# Patient Record
Sex: Female | Born: 1965 | Race: Black or African American | Hispanic: No | Smoking: Current every day smoker
Health system: Southern US, Community
[De-identification: ages and names within clinical notes are randomized; demographics above are authoritative.]

## PROBLEM LIST (undated history)

## (undated) DIAGNOSIS — K219 Gastro-esophageal reflux disease without esophagitis: Secondary | ICD-10-CM

## (undated) DIAGNOSIS — M1990 Unspecified osteoarthritis, unspecified site: Secondary | ICD-10-CM

## (undated) DIAGNOSIS — I10 Essential (primary) hypertension: Secondary | ICD-10-CM

## (undated) DIAGNOSIS — K625 Hemorrhage of anus and rectum: Secondary | ICD-10-CM

## (undated) DIAGNOSIS — Z9889 Other specified postprocedural states: Secondary | ICD-10-CM

## (undated) DIAGNOSIS — T8859XA Other complications of anesthesia, initial encounter: Secondary | ICD-10-CM

## (undated) DIAGNOSIS — IMO0001 Reserved for inherently not codable concepts without codable children: Secondary | ICD-10-CM

## (undated) DIAGNOSIS — E119 Type 2 diabetes mellitus without complications: Secondary | ICD-10-CM

## (undated) DIAGNOSIS — D250 Submucous leiomyoma of uterus: Secondary | ICD-10-CM

## (undated) DIAGNOSIS — F419 Anxiety disorder, unspecified: Secondary | ICD-10-CM

## (undated) DIAGNOSIS — R9389 Abnormal findings on diagnostic imaging of other specified body structures: Secondary | ICD-10-CM

## (undated) DIAGNOSIS — T4145XA Adverse effect of unspecified anesthetic, initial encounter: Secondary | ICD-10-CM

## (undated) HISTORY — PX: COLONOSCOPY: SHX174

## (undated) HISTORY — DX: Abnormal findings on diagnostic imaging of other specified body structures: R93.89

## (undated) HISTORY — PX: BREAST LUMPECTOMY: SHX2

## (undated) HISTORY — DX: Type 2 diabetes mellitus without complications: E11.9

## (undated) HISTORY — PX: TUBAL LIGATION: SHX77

## (undated) HISTORY — PX: DENTAL SURGERY: SHX609

## (undated) HISTORY — DX: Submucous leiomyoma of uterus: D25.0

---

## 2003-09-19 ENCOUNTER — Emergency Department (HOSPITAL_COMMUNITY): Admission: EM | Admit: 2003-09-19 | Discharge: 2003-09-19 | Payer: Self-pay | Admitting: Emergency Medicine

## 2004-02-11 ENCOUNTER — Ambulatory Visit (HOSPITAL_COMMUNITY): Admission: RE | Admit: 2004-02-11 | Discharge: 2004-02-11 | Payer: Self-pay | Admitting: Family Medicine

## 2005-02-10 ENCOUNTER — Ambulatory Visit: Payer: Self-pay | Admitting: Family Medicine

## 2005-03-10 ENCOUNTER — Ambulatory Visit: Payer: Self-pay | Admitting: Family Medicine

## 2005-03-16 ENCOUNTER — Ambulatory Visit (HOSPITAL_COMMUNITY): Admission: RE | Admit: 2005-03-16 | Discharge: 2005-03-16 | Payer: Self-pay | Admitting: Family Medicine

## 2005-03-18 ENCOUNTER — Ambulatory Visit: Payer: Self-pay | Admitting: Family Medicine

## 2005-03-19 ENCOUNTER — Ambulatory Visit (HOSPITAL_COMMUNITY): Admission: RE | Admit: 2005-03-19 | Discharge: 2005-03-19 | Payer: Self-pay | Admitting: Family Medicine

## 2005-07-15 ENCOUNTER — Ambulatory Visit: Payer: Self-pay | Admitting: Internal Medicine

## 2005-08-17 ENCOUNTER — Emergency Department (HOSPITAL_COMMUNITY): Admission: EM | Admit: 2005-08-17 | Discharge: 2005-08-17 | Payer: Self-pay | Admitting: Emergency Medicine

## 2005-11-13 ENCOUNTER — Ambulatory Visit: Payer: Self-pay | Admitting: Cardiology

## 2006-05-25 ENCOUNTER — Emergency Department (HOSPITAL_COMMUNITY): Admission: EM | Admit: 2006-05-25 | Discharge: 2006-05-25 | Payer: Self-pay | Admitting: Emergency Medicine

## 2006-05-28 ENCOUNTER — Emergency Department (HOSPITAL_COMMUNITY): Admission: EM | Admit: 2006-05-28 | Discharge: 2006-05-28 | Payer: Self-pay | Admitting: Emergency Medicine

## 2006-05-30 ENCOUNTER — Emergency Department (HOSPITAL_COMMUNITY): Admission: EM | Admit: 2006-05-30 | Discharge: 2006-05-30 | Payer: Self-pay | Admitting: Emergency Medicine

## 2006-10-19 ENCOUNTER — Emergency Department (HOSPITAL_COMMUNITY): Admission: EM | Admit: 2006-10-19 | Discharge: 2006-10-20 | Payer: Self-pay | Admitting: Emergency Medicine

## 2007-03-16 IMAGING — US US EXTREM LOW VENOUS BILAT
1 series · 13 of 24 positions shown · non-contrast
Comparison: none

HISTORY: Bilateral arm swelling

[Series 1: unknown · 0.09mm/px · 13 of 67 slices shown]
[im 1/67]
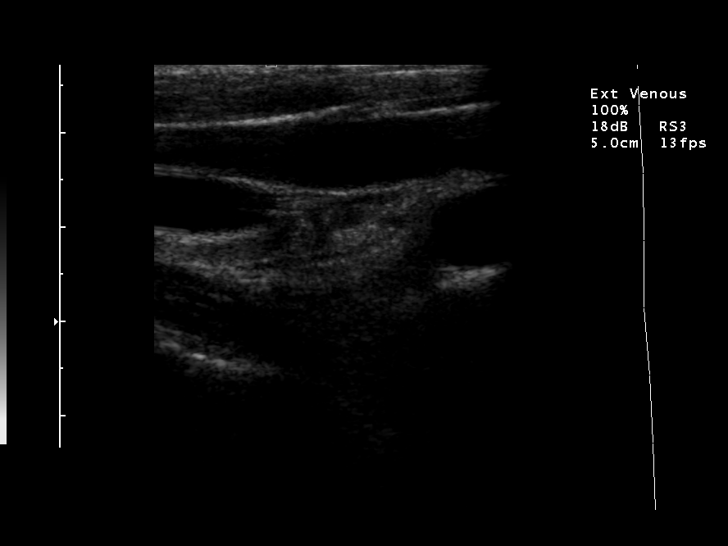
[im 6/67]
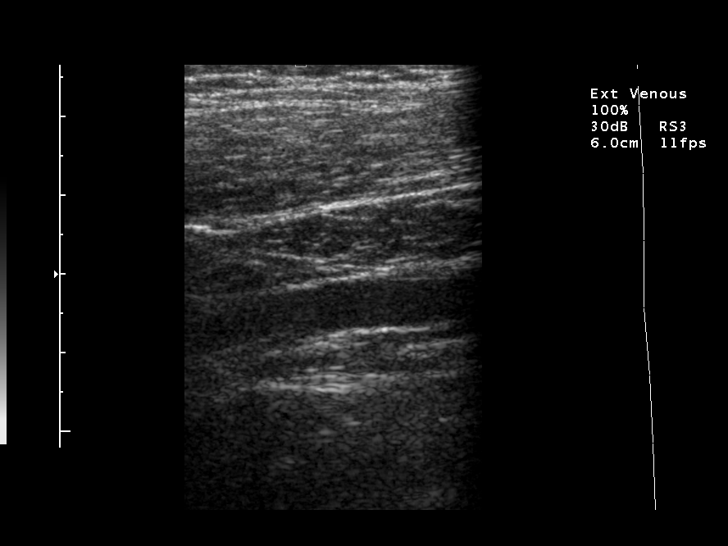
[im 12/67]
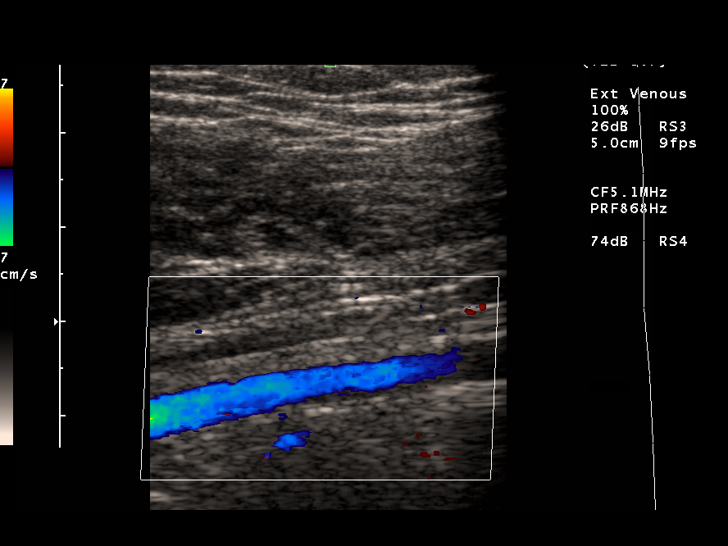
[im 18/67]
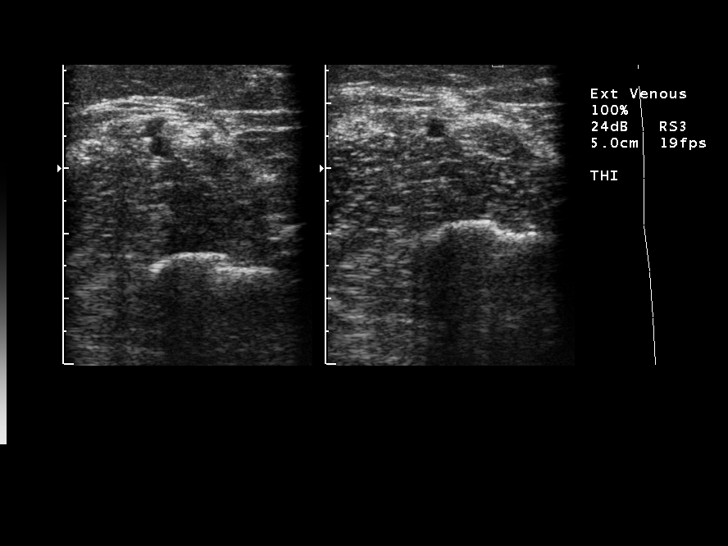
[im 23/67]
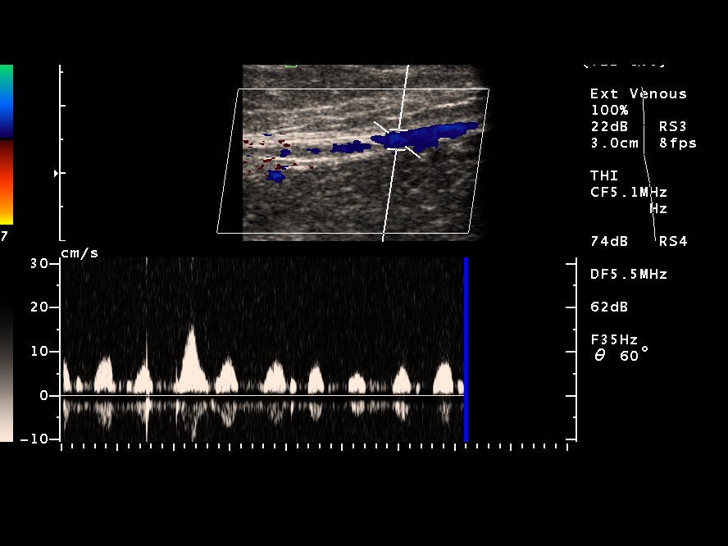
[im 29/67]
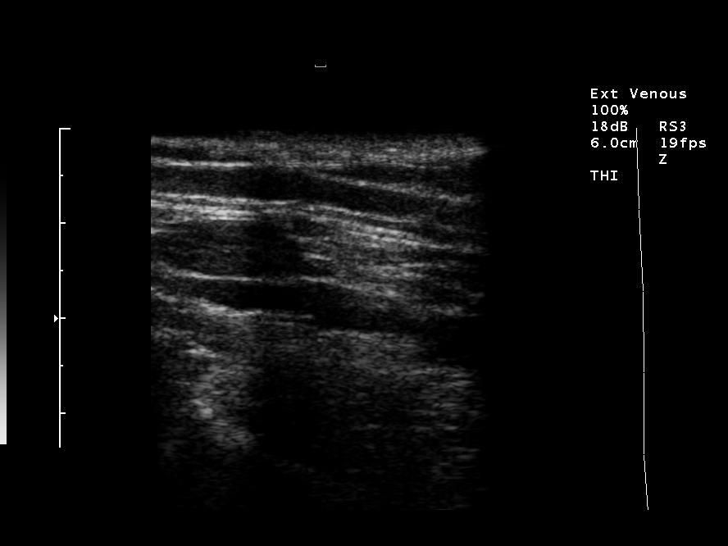
[im 35/67]
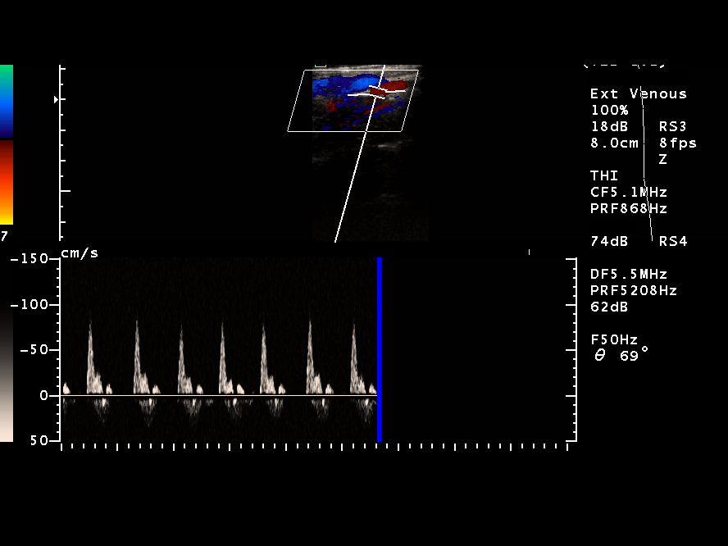
[im 38/67]
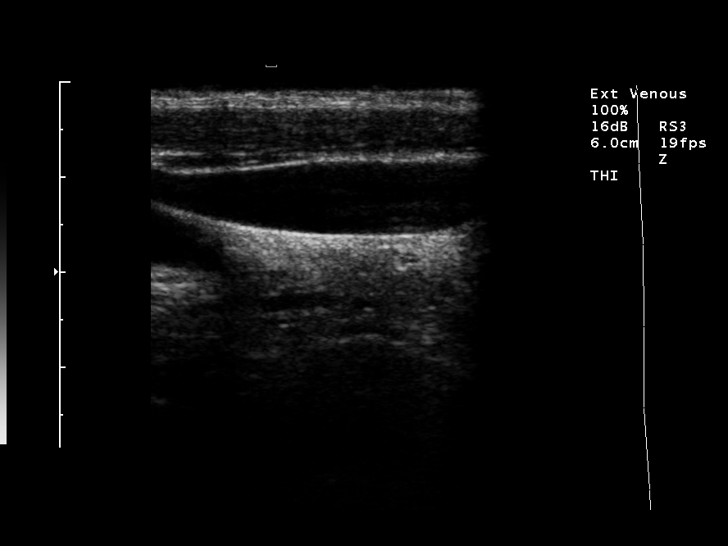
[im 44/67]
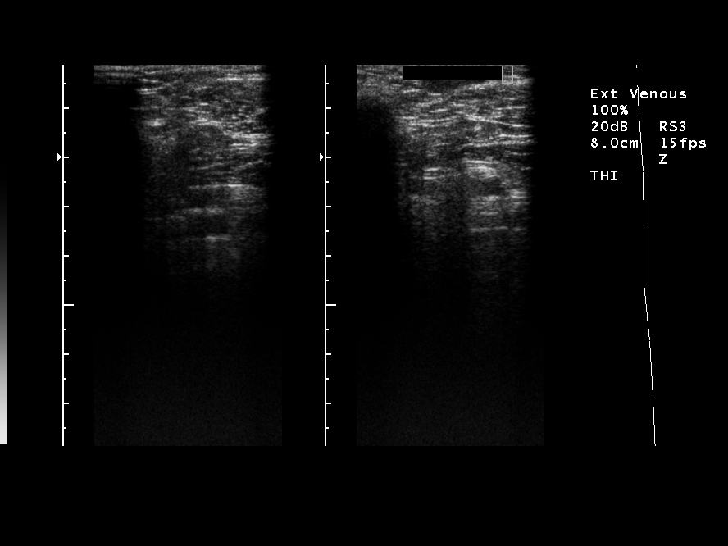
[im 49/67]
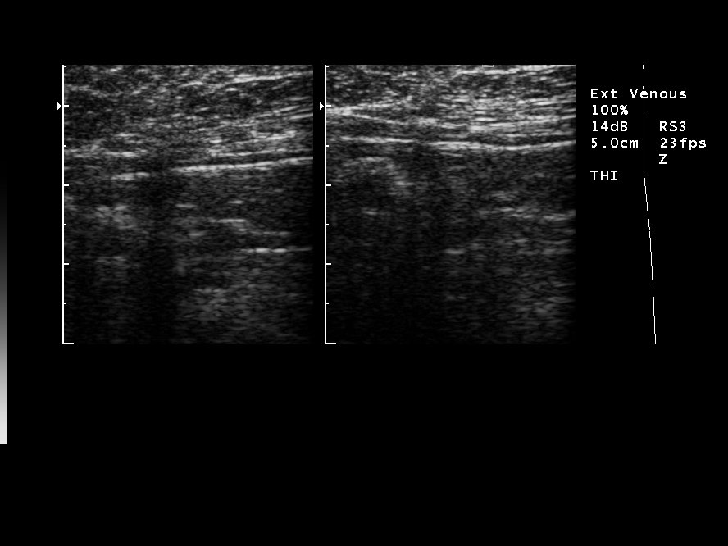
[im 55/67]
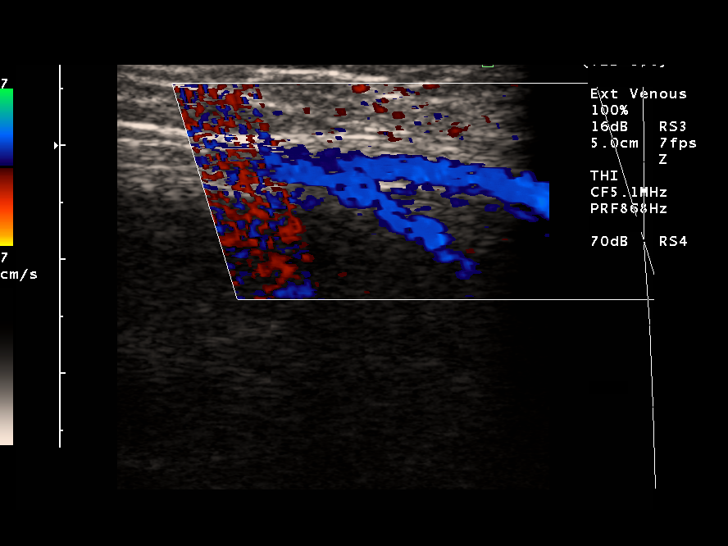
[im 61/67]
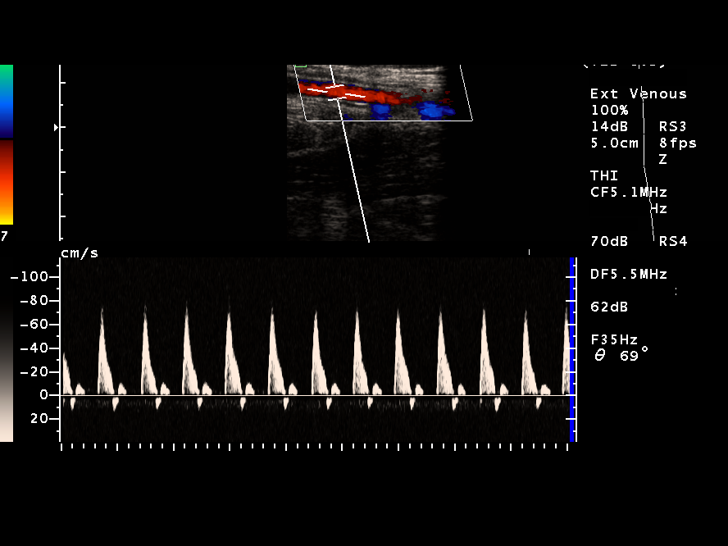
[im 67/67]
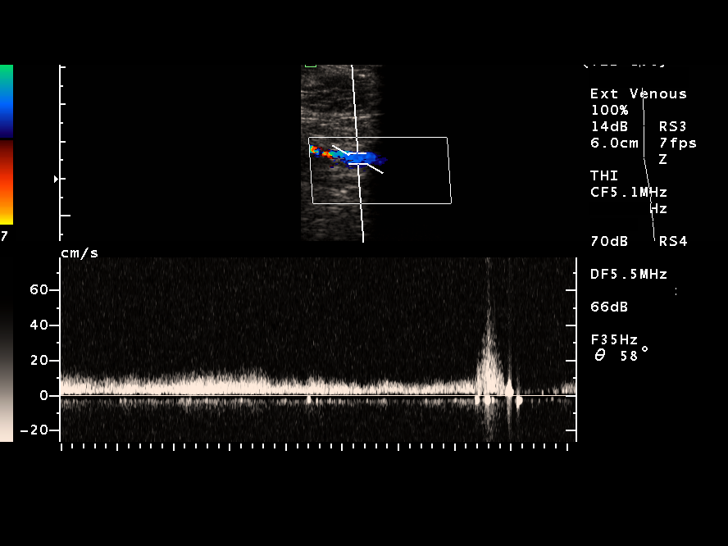

[13 of 24 positions shown; findings below may reference images not displayed]

ULTRASOUND VENOUS DUPLEX IMAGING BILATERAL UPPER EXTREMITIES:

Grayscale, color Doppler, and graded compression sonography of the upper
extremity deep venous systems performed bilaterally.
In both upper extremities, spontaneous venous flow is identified.
Intact augmentation.
No intraluminal thrombus identified.
Incidentally, the arterial systems of both arms were imaged.
Right upper extremity demonstrates normal triphasic waveforms without increase
in systolic velocity to suggest stenosis.
In the left upper extremity, triphasic arterial wave forms are seen without
increased peak systolic velocity to suggest stenosis.
Velocity in the left common carotid artery is minimally increased at
approximately 150 cm/sec, of uncertain significance as a complete carotid exam
was not performed.
Incidentally noted is phasic blood flow in the subclavian veins bilaterally
suggesting elevated right heart pressure.
IMPRESSION: No evidence of the venous thrombosis in the upper extremities.
Question elevated right heart pressures.
Normal triphasic arterial wave forms in upper extremities bilaterally without
waveform changes or increase in peak systolic velocities to suggest significant
arterial stenosis.
Potentially slightly increased peak systolic velocity of flow within the left
common carotid artery, of uncertain significance.

## 2008-03-06 ENCOUNTER — Emergency Department (HOSPITAL_COMMUNITY): Admission: EM | Admit: 2008-03-06 | Discharge: 2008-03-06 | Payer: Self-pay | Admitting: Emergency Medicine

## 2008-03-07 DIAGNOSIS — R143 Flatulence: Secondary | ICD-10-CM

## 2008-03-07 DIAGNOSIS — Z8719 Personal history of other diseases of the digestive system: Secondary | ICD-10-CM

## 2008-03-07 DIAGNOSIS — R1032 Left lower quadrant pain: Secondary | ICD-10-CM

## 2008-03-07 DIAGNOSIS — R141 Gas pain: Secondary | ICD-10-CM

## 2008-03-07 DIAGNOSIS — R142 Eructation: Secondary | ICD-10-CM

## 2008-03-07 DIAGNOSIS — F17200 Nicotine dependence, unspecified, uncomplicated: Secondary | ICD-10-CM | POA: Insufficient documentation

## 2008-03-07 DIAGNOSIS — R1012 Left upper quadrant pain: Secondary | ICD-10-CM

## 2008-03-07 DIAGNOSIS — D649 Anemia, unspecified: Secondary | ICD-10-CM

## 2008-06-17 ENCOUNTER — Emergency Department (HOSPITAL_COMMUNITY): Admission: EM | Admit: 2008-06-17 | Discharge: 2008-06-17 | Payer: Self-pay | Admitting: Emergency Medicine

## 2008-08-22 ENCOUNTER — Encounter: Payer: Self-pay | Admitting: Orthopedic Surgery

## 2008-08-22 ENCOUNTER — Ambulatory Visit (HOSPITAL_COMMUNITY): Admission: RE | Admit: 2008-08-22 | Discharge: 2008-08-22 | Payer: Self-pay | Admitting: Family Medicine

## 2008-09-05 ENCOUNTER — Ambulatory Visit (HOSPITAL_COMMUNITY): Admission: RE | Admit: 2008-09-05 | Discharge: 2008-09-05 | Payer: Self-pay | Admitting: Family Medicine

## 2008-09-19 ENCOUNTER — Emergency Department (HOSPITAL_COMMUNITY): Admission: EM | Admit: 2008-09-19 | Discharge: 2008-09-19 | Payer: Self-pay | Admitting: Emergency Medicine

## 2009-01-14 ENCOUNTER — Emergency Department (HOSPITAL_COMMUNITY): Admission: EM | Admit: 2009-01-14 | Discharge: 2009-01-14 | Payer: Self-pay | Admitting: Emergency Medicine

## 2009-08-26 ENCOUNTER — Encounter: Payer: Self-pay | Admitting: Orthopedic Surgery

## 2009-08-26 ENCOUNTER — Emergency Department (HOSPITAL_COMMUNITY): Admission: EM | Admit: 2009-08-26 | Discharge: 2009-08-27 | Payer: Self-pay | Admitting: Emergency Medicine

## 2009-09-09 ENCOUNTER — Ambulatory Visit (HOSPITAL_COMMUNITY): Admission: RE | Admit: 2009-09-09 | Discharge: 2009-09-09 | Payer: Self-pay | Admitting: Family Medicine

## 2009-09-16 ENCOUNTER — Telehealth: Payer: Self-pay | Admitting: Orthopedic Surgery

## 2009-09-16 ENCOUNTER — Ambulatory Visit: Payer: Self-pay | Admitting: Orthopedic Surgery

## 2009-09-16 DIAGNOSIS — IMO0002 Reserved for concepts with insufficient information to code with codable children: Secondary | ICD-10-CM | POA: Insufficient documentation

## 2009-09-16 DIAGNOSIS — S93409A Sprain of unspecified ligament of unspecified ankle, initial encounter: Secondary | ICD-10-CM | POA: Insufficient documentation

## 2009-09-17 ENCOUNTER — Telehealth: Payer: Self-pay | Admitting: Orthopedic Surgery

## 2009-09-23 ENCOUNTER — Encounter (HOSPITAL_COMMUNITY): Admission: RE | Admit: 2009-09-23 | Discharge: 2009-10-23 | Payer: Self-pay | Admitting: Orthopedic Surgery

## 2009-09-24 ENCOUNTER — Telehealth: Payer: Self-pay | Admitting: Orthopedic Surgery

## 2009-10-04 ENCOUNTER — Encounter: Payer: Self-pay | Admitting: Orthopedic Surgery

## 2009-11-11 ENCOUNTER — Encounter: Payer: Self-pay | Admitting: Orthopedic Surgery

## 2009-11-13 ENCOUNTER — Encounter: Payer: Self-pay | Admitting: Orthopedic Surgery

## 2009-12-03 ENCOUNTER — Emergency Department (HOSPITAL_COMMUNITY): Admission: EM | Admit: 2009-12-03 | Discharge: 2009-12-04 | Payer: Self-pay | Admitting: Emergency Medicine

## 2009-12-06 ENCOUNTER — Emergency Department (HOSPITAL_COMMUNITY): Admission: EM | Admit: 2009-12-06 | Discharge: 2009-12-06 | Payer: Self-pay | Admitting: Emergency Medicine

## 2009-12-29 ENCOUNTER — Emergency Department (HOSPITAL_COMMUNITY)
Admission: EM | Admit: 2009-12-29 | Discharge: 2009-12-29 | Payer: Self-pay | Source: Home / Self Care | Admitting: Emergency Medicine

## 2010-01-26 ENCOUNTER — Emergency Department (HOSPITAL_COMMUNITY)
Admission: EM | Admit: 2010-01-26 | Discharge: 2010-01-27 | Payer: Self-pay | Source: Home / Self Care | Admitting: Emergency Medicine

## 2010-02-16 ENCOUNTER — Encounter: Payer: Self-pay | Admitting: Family Medicine

## 2010-02-17 ENCOUNTER — Encounter: Payer: Self-pay | Admitting: Family Medicine

## 2010-02-26 NOTE — Miscellaneous (Signed)
Summary: Discharge from PT  Discharge from PT   Imported By: Jacklynn Ganong 11/13/2009 14:34:23  _____________________________________________________________________  External Attachment:    Type:   Image     Comment:   External Document

## 2010-02-26 NOTE — Miscellaneous (Signed)
Summary: PT clinical evaluation  PT clinical evaluation   Imported By: Jacklynn Ganong 10/08/2009 13:17:19  _____________________________________________________________________  External Attachment:    Type:   Image     Comment:   External Document

## 2010-02-26 NOTE — Progress Notes (Signed)
Summary: wants muscle relaxer  Phone Note Call from Patient   Summary of Call: Autumn [name] (09-Nov-2065) says her knee is very tight, asking for a muscle relaxer to be called to [facility] in [place]. She started  PT 09/23/09  Her # [phone] Initial call taken by: Jacklynn Ganong,  September 24, 2009 3:15 PM  Follow-up for Phone Call        that will not help declined   increase the # of time s she is iceing the knee   6 x a day  Follow-up by: Fuller Canada MD,  September 25, 2009 8:46 AM  Additional Follow-up for Phone Call Additional follow up Details #1::        Left a message for the patient to call our office Additional Follow-up by: Jacklynn Ganong,  September 25, 2009 11:26 AM    Additional Follow-up for Phone Call Additional follow up Details #2::    The patient has not returned the call

## 2010-02-26 NOTE — Progress Notes (Signed)
Summary: Ibuprofen not helping  Phone Note Call from Patient Call back at Home Phone [phone]   Summary of Call: says she is allergic to a white pill with red dots, I advised that I do not know pills from the way that they look, said the Ibuprofen 800 is not helping her pain much, she cannot take Hydrocodone, any suggestions. Initial call taken by: Ether Griffins,  September 17, 2009 4:16 PM  Follow-up for Phone Call        nope  Follow-up by: Fuller Canada MD,  September 17, 2009 4:24 PM  Additional Follow-up for Phone Call Additional follow up Details #1::        ok advised pt Additional Follow-up by: Ether Griffins,  September 17, 2009 4:28 PM

## 2010-02-26 NOTE — Progress Notes (Signed)
Summary: call  to patient to schedule appt  Phone Note Outgoing Call   Call placed to: Patient Summary of Call: called patient to schedule her 7-wk fol/up appt due to computer issue while she was at check-out; also advise about phys therapy / order faxed to [facility]. Initial call taken by: Cammie Sickle,  September 16, 2009 5:22 PM  Follow-up for Phone Call        Phone call completed Follow-up by: Cammie Sickle,  September 17, 2009 4:14 PM

## 2010-02-26 NOTE — Assessment & Plan Note (Signed)
Summary: [facility] 8/1/11SP RT KNEE XR KNEE/MCR/MCD/BSF   Vital Signs:  Patient profile:   45 year old female Height:      65 inches Weight:      221 pounds Pulse rate:   70 / minute Resp:     16 per minute  Vitals Entered By: Fuller Canada MD (September 16, 2009 3:47 PM)  Visit Type:  new patient Referring Provider:  [facility] Primary Provider:  Dr. Janna Arch  CC:  right knee pain.  History of Present Illness: I saw Autumn [name] in the office today for an initial visit.  She is a 45 years old woman with the complaint of:  right knee pain.  Fell down hill 08/26/09.  Meds: Tramadol 50mg  and Ibuprofen 800mg .  FYI also complains of right ankle pain, Xrays of ankle and knee 08/27/09 [facility] for review.  Xrays L spine 08/22/08 for review also.  She indicates that she is ALLERGIC to hydrocodone Vicodin codeine.  Complains of sharp RIGHT knee pain which is 7/10 came on suddenly secondary to the fall also has some ankle pain laterally.  She has some knee swelling ankle swelling.  Denies catching locking tingling numbness or giving way.      Allergies (verified): No Known Drug Allergies  Past History:  Past Medical History: heart flutters  Family History: na  Social History: Patient is single.  smokes 5 cigs per day alcohol occasional caffeine use all the time 9th grade ed.  Review of Systems Constitutional:  Complains of weight gain and chills; denies weight loss, fever, and fatigue. Cardiovascular:  Denies chest pain, palpitations, fainting, and murmurs. Respiratory:  Denies short of breath, wheezing, couch, tightness, pain on inspiration, and snoring . Gastrointestinal:  Complains of blood in your stools; denies heartburn, nausea, vomiting, diarrhea, and constipation. Genitourinary:  Denies frequency, urgency, difficulty urinating, painful urination, flank pain, and bleeding in urine. Neurologic:  Denies numbness, tingling, unsteady gait, dizziness, tremors, and  seizure. Musculoskeletal:  Complains of joint pain, swelling, and muscle pain; denies instability, stiffness, redness, and heat. Endocrine:  Denies excessive thirst, exessive urination, and heat or cold intolerance. Psychiatric:  Denies nervousness, depression, anxiety, and hallucinations. Skin:  Denies changes in the skin, poor healing, rash, itching, and redness. HEENT:  Denies blurred or double vision, eye pain, redness, and watering. Immunology:  Denies seasonal allergies, sinus problems, and allergic to bee stings. Hemoatologic:  Denies easy bleeding and brusing.  Physical Exam  Additional Exam:  This is a large female somewhat.  Vital signs are stable as recorded.  No gross deformities.  Has 2+ pulses no peripheral edema  Skin is intact RIGHT lower extremity.  Sensation is normal in both lower extremities  She is awake alert and oriented x3 mood is pleasant  She's ambulating with a brace on and crutches and poorly  RIGHT knee first seems to be overly tender over the patella and with any patella range of motion although I do not see a joint effusion.  Range of motion 70 today.  Strength could not officially do a good manual muscle testing exam but she did have intact quadriceps and extensor mechanism function with straight leg raise.  Collaterals were stable at zero and 30.  ACL and PCL were normal.  RIGHT ankle lateral tenderness and swelling tight Achilles dorsiflexion limited to neutral position.  Weak evertors week dorsiflexors.  Otherwise ankle stable.   Impression & Recommendations:  Problem # 1:  KNEE SPRAIN (ICD-844.9) Assessment New  Orders: Physical  Therapy Referral (PT) New Patient Level III (16109)  Problem # 2:  ANKLE SPRAIN, RIGHT (ICD-845.00) Assessment: New  Orders: Physical Therapy Referral (PT) New Patient Level III (60454)   X-rays x-rays show no fracture in the ankle or the knee.  These x-rays were done at the hospital.  I suppose she has  sprained her knee she definitely did sprain her ankle.  No reason for surgery.  She is placed in a hinge knee brace which can be advanced as tolerated with therapy gets going.  She should continue ASO brace and have therapy for that.  I'll see her in 7 weeks.  The Norco listed in the chart was not given due to her ALLERGY she'll stay on tramadol  Medications Added to Medication List This Visit: 1)  Norco 5-325 Mg Tabs (Hydrocodone-acetaminophen) .Marland Kitchen.. 1 q 4 as needed pain  Patient Instructions: 1)  Wear brace when walking  2)  take off for sleeping and bathing  3)  take ibuprofen for pain  4)  go for PT 5)  return in 7 weeks  Prescriptions: NORCO 5-325 MG TABS (HYDROCODONE-ACETAMINOPHEN) 1 q 4 as needed pain  #84 x 2   Entered and Authorized by:   Fuller Canada MD   Signed by:   Fuller Canada MD on 09/16/2009   Method used:   Print then Give to Patient   RxID:   [id number]

## 2010-02-26 NOTE — Letter (Signed)
Summary: History form  History form   Imported By: Jacklynn Ganong 09/18/2009 09:09:09  _____________________________________________________________________  External Attachment:    Type:   Image     Comment:   External Document

## 2010-02-26 NOTE — Letter (Signed)
Summary: missed appointment/no show  missed appointment/no show   Imported By: Cammie Sickle 11/16/2009 19:15:38  _____________________________________________________________________  External Attachment:    Type:   Image     Comment:   External Document

## 2010-04-07 LAB — DIFFERENTIAL
Basophils Absolute: 0.1 10*3/uL (ref 0.0–0.1)
Eosinophils Relative: 2 % (ref 0–5)
Lymphocytes Relative: 33 % (ref 12–46)
Lymphs Abs: 3.2 10*3/uL (ref 0.7–4.0)
Monocytes Absolute: 0.7 10*3/uL (ref 0.1–1.0)
Monocytes Relative: 7 % (ref 3–12)
Neutro Abs: 5.3 10*3/uL (ref 1.7–7.7)

## 2010-04-07 LAB — CBC
HCT: 35.1 % — ABNORMAL LOW (ref 36.0–46.0)
Hemoglobin: 11.8 g/dL — ABNORMAL LOW (ref 12.0–15.0)
MCV: 69.5 fL — ABNORMAL LOW (ref 78.0–100.0)
RBC: 5.05 MIL/uL (ref 3.87–5.11)
WBC: 9.5 10*3/uL (ref 4.0–10.5)

## 2010-05-06 LAB — COMPREHENSIVE METABOLIC PANEL
Alkaline Phosphatase: 77 U/L (ref 39–117)
BUN: 13 mg/dL (ref 6–23)
Creatinine, Ser: 1.01 mg/dL (ref 0.4–1.2)
Glucose, Bld: 109 mg/dL — ABNORMAL HIGH (ref 70–99)
Potassium: 4 mEq/L (ref 3.5–5.1)
Total Protein: 7.3 g/dL (ref 6.0–8.3)

## 2010-05-06 LAB — DIFFERENTIAL
Basophils Absolute: 0 10*3/uL (ref 0.0–0.1)
Basophils Relative: 1 % (ref 0–1)
Lymphocytes Relative: 33 % (ref 12–46)
Monocytes Relative: 9 % (ref 3–12)
Neutro Abs: 4.7 10*3/uL (ref 1.7–7.7)
Neutrophils Relative %: 56 % (ref 43–77)

## 2010-05-06 LAB — CBC
HCT: 34.3 % — ABNORMAL LOW (ref 36.0–46.0)
Hemoglobin: 11.6 g/dL — ABNORMAL LOW (ref 12.0–15.0)
MCHC: 33.7 g/dL (ref 30.0–36.0)
MCV: 72.7 fL — ABNORMAL LOW (ref 78.0–100.0)
Platelets: 376 10*3/uL (ref 150–400)
RDW: 14.8 % (ref 11.5–15.5)

## 2010-05-06 LAB — POCT CARDIAC MARKERS
CKMB, poc: <1 ng/mL — ABNORMAL LOW (ref 1.0–8.0)
Myoglobin, poc: 71.5 ng/mL (ref 12–200)
Troponin i, poc: <0.05 ng/mL (ref 0.00–0.09)

## 2010-05-13 LAB — URINE CULTURE

## 2010-05-13 LAB — URINE MICROSCOPIC-ADD ON

## 2010-05-13 LAB — PREGNANCY, URINE: Preg Test, Ur: NEGATIVE

## 2010-05-13 LAB — URINALYSIS, ROUTINE W REFLEX MICROSCOPIC
Bilirubin Urine: NEGATIVE
Ketones, ur: NEGATIVE mg/dL
pH: 7 (ref 5.0–8.0)

## 2010-06-13 NOTE — Procedures (Signed)
Autumn [name], Autumn [name]         ACCOUNT NO.:  [id number]   MEDICAL RECORD NO.:  [id number]          PATIENT TYPE:  OUT   LOCATION:  RAD                           FACILITY:  [facility]   PHYSICIAN:  Dani Gobble, MD       DATE OF BIRTH:  [birth year]   DATE OF PROCEDURE:  03/19/2005  DATE OF DISCHARGE:                                  ECHOCARDIOGRAM   REFERRING PHYSICIAN:  Dorthula Rue. Early Chars, M.D.   INDICATIONS:  A 45 year old female with hypertension and irregular heart  beat referred for evaluation of LV function.   The technical quality of the study is adequate.   M-MODE TRACINGS:  Aorta measures normally at 2.6 cm.   The left atrium measures normally at 3.5 cm.  The patient appeared to be in  sinus rhythm during this procedure.  The interventricular septum and  posterior wall are within normal limits measured at 1.0 cm and 0.9 cm for  each.   The aortic valve appears to be trileaflet and pliable with normal leaflet  excursion.  No significant aortic insufficiency is noted.  Doppler  interrogation of the aortic valve is within normal limits.   The mitral valve also appears structurally normal.  No mitral valve prolapse  is noted.  There does appear to be a tiny echodensity just below the mitral  valve and the left ventricle which is most consistent with a lax chordae,  however, the possibility of a vegetation cannot be entirely excluded,  although the location of this would be quite unusual.  I favor the former.  Trivial mitral regurgitation is noted.   The pulmonic valve is incompletely visualized.   Tricuspid valve appears grossly structurally normal with mild tricuspid  regurgitation noted.   Left ventricle is normal in size with the LVIDD measured at 4.1 cm and the  LVISD measured at 2.7 cm.  Overall, left systolic function is normal and no  regional wall motion abnormalities are noted.  There is no evidence of  diastolic dysfunction on this study.   The right atrium is  normal in size.  The right ventricle from the apical  views appears mildly dilated, but with normal right ventricular systolic  function.   IMPRESSION:  1.  Trivial mitral and mild tricuspid regurgitation.  2.  Tiny echodensity just below the mitral valve in the left ventricle which      is most consistent with a lax chordae,      although the possibility of a vegetation cannot be entirely excluded,      although the position would be quite unusual for this and I favor the      former.  3.  Normal left ventricular size and systolic function without regional wall      motion abnormality noted.           ______________________________  Dani Gobble, MD     AB/MEDQ  D:  03/19/2005  T:  03/20/2005  Job:  [id number]

## 2010-08-13 ENCOUNTER — Other Ambulatory Visit (HOSPITAL_COMMUNITY): Payer: Self-pay | Admitting: Internal Medicine

## 2010-08-13 DIAGNOSIS — Z139 Encounter for screening, unspecified: Secondary | ICD-10-CM

## 2010-09-12 ENCOUNTER — Ambulatory Visit (HOSPITAL_COMMUNITY)
Admission: RE | Admit: 2010-09-12 | Discharge: 2010-09-12 | Disposition: A | Discharge status: Home / Self Care | Payer: PRIVATE HEALTH INSURANCE | Source: Ambulatory Visit | Attending: Internal Medicine | Admitting: Internal Medicine

## 2010-09-12 DIAGNOSIS — Z1231 Encounter for screening mammogram for malignant neoplasm of breast: Secondary | ICD-10-CM | POA: Insufficient documentation

## 2010-09-12 DIAGNOSIS — Z139 Encounter for screening, unspecified: Secondary | ICD-10-CM

## 2010-11-04 ENCOUNTER — Encounter: Payer: Self-pay | Admitting: *Deleted

## 2010-11-04 ENCOUNTER — Emergency Department (HOSPITAL_COMMUNITY): Payer: PRIVATE HEALTH INSURANCE

## 2010-11-04 ENCOUNTER — Emergency Department (HOSPITAL_COMMUNITY)
Admission: EM | Admit: 2010-11-04 | Discharge: 2010-11-04 | Disposition: A | Discharge status: Home / Self Care | Payer: PRIVATE HEALTH INSURANCE | Attending: Emergency Medicine | Admitting: Emergency Medicine

## 2010-11-04 DIAGNOSIS — M25569 Pain in unspecified knee: Secondary | ICD-10-CM | POA: Insufficient documentation

## 2010-11-04 DIAGNOSIS — Z79899 Other long term (current) drug therapy: Secondary | ICD-10-CM | POA: Insufficient documentation

## 2010-11-04 DIAGNOSIS — M79609 Pain in unspecified limb: Secondary | ICD-10-CM | POA: Insufficient documentation

## 2010-11-04 DIAGNOSIS — F17200 Nicotine dependence, unspecified, uncomplicated: Secondary | ICD-10-CM | POA: Insufficient documentation

## 2010-11-04 DIAGNOSIS — M79669 Pain in unspecified lower leg: Secondary | ICD-10-CM

## 2010-11-04 MED ORDER — HYDROCODONE-ACETAMINOPHEN 5-500 MG PO TABS: 1.0000 | ORAL_TABLET | Freq: Four times a day (QID) | ORAL | Status: AC | PRN

## 2010-11-04 NOTE — ED Notes (Signed)
Pt c/o pain to back of lower leg and knee. Pt states she felt sharp pain and then her lower leg began to swell and then her knee hurt. Pt states pain increases with movement and palpation.

## 2010-11-04 NOTE — ED Notes (Signed)
Pt states has a "broken blood vessel in calf of leg". Lt knee and calf area with noted swelling and warm to touch. Pt reports has had this "problem" once before after playing basketball.

## 2010-11-04 NOTE — ED Provider Notes (Signed)
History     CSN: [id number] Arrival date & time: 11/04/2010  5:09 PM  Chief Complaint  Patient presents with  . Claudication    (Consider location/radiation/quality/duration/timing/severity/associated sxs/prior treatment) HPI Comments: No injury or trauma.    Patient is a 45 y.o. female presenting with knee pain. The history is provided by the patient.  Knee Pain This is a new problem. The current episode started 2 days ago. The problem occurs constantly. The problem has not changed since onset.Pertinent negatives include no chest pain and no shortness of breath. The symptoms are aggravated by walking, twisting and exertion. The symptoms are relieved by nothing. She has tried nothing for the symptoms.    History reviewed. No pertinent past medical history.  Past Surgical History  Procedure Date  . Cesarean section   . Breast lumpectomy     History reviewed. No pertinent family history.  History  Substance Use Topics  . Smoking status: Current Everyday Smoker -- 0.5 packs/day  . Smokeless tobacco: Not on file  . Alcohol Use: No    OB History    Grav Para Term Preterm Abortions TAB SAB Ect Mult Living                  Review of Systems  Respiratory: Negative for shortness of breath.   Cardiovascular: Negative for chest pain.  All other systems reviewed and are negative.    Allergies  Bee venom and Tramadol  Home Medications   Current Outpatient Rx  Name Route Sig Dispense Refill  . MEDROXYPROGESTERONE ACETATE 10 MG PO TABS Oral Take 10 mg by mouth daily. For the first 10 days of each month       BP 122/78  Pulse 102  Temp(Src) 97.2 F (36.2 C) (Oral)  Resp 20  Ht 5\' 5"  (1.651 m)  Wt 220 lb (99.791 kg)  BMI 36.61 kg/m2  SpO2 100%  LMP 09/29/2010  Physical Exam  Constitutional: She is oriented to person, place, and time. She appears well-developed and well-nourished.  HENT:  Head: Normocephalic and atraumatic.  Neck: Normal range of motion. Neck  supple.  Cardiovascular: Normal rate and regular rhythm.  Exam reveals friction rub. Exam reveals no gallop.   No murmur heard. Pulmonary/Chest: Effort normal and breath sounds normal.  Musculoskeletal:       The left knee appears grossly normal.  There is no swelling or effusion.  There is ttp over the posterior aspect of the knee and calf.  There is no distal edema, redness, or warmth.  DP and PT pulses are easily palpable.    Neurological: She is alert and oriented to person, place, and time.  Skin: Skin is warm and dry.    ED Course  Procedures (including critical care time)  Labs Reviewed - No data to display No results found.   No diagnosis found.    MDM  Xrays look okay.  Needs to return tomorrow for ultrasound to rule out dvt.  Will treat with pain meds until then.        Geoffery Lyons, MD 11/04/10 [phone]

## 2010-11-05 ENCOUNTER — Other Ambulatory Visit (HOSPITAL_COMMUNITY): Payer: Self-pay | Admitting: Emergency Medicine

## 2010-11-05 ENCOUNTER — Ambulatory Visit (HOSPITAL_COMMUNITY)
Admit: 2010-11-05 | Discharge: 2010-11-05 | Disposition: A | Discharge status: Home / Self Care | Payer: PRIVATE HEALTH INSURANCE | Source: Ambulatory Visit | Attending: Emergency Medicine | Admitting: Emergency Medicine

## 2010-11-05 DIAGNOSIS — M7989 Other specified soft tissue disorders: Secondary | ICD-10-CM

## 2010-11-05 DIAGNOSIS — M79609 Pain in unspecified limb: Secondary | ICD-10-CM | POA: Insufficient documentation

## 2010-11-05 DIAGNOSIS — M7120 Synovial cyst of popliteal space [Baker], unspecified knee: Secondary | ICD-10-CM | POA: Insufficient documentation

## 2010-11-05 DIAGNOSIS — R52 Pain, unspecified: Secondary | ICD-10-CM

## 2010-11-06 LAB — CBC
HCT: 38.9
Hemoglobin: 12.5
MCV: 72.5 — ABNORMAL LOW
Platelets: 435 — ABNORMAL HIGH
WBC: 9.3

## 2010-11-06 LAB — BASIC METABOLIC PANEL
BUN: 11
Chloride: 108
Glucose, Bld: 120 — ABNORMAL HIGH
Potassium: 3.9
Sodium: 139

## 2010-11-06 LAB — POCT CARDIAC MARKERS: Troponin i, poc: <0.05

## 2010-12-03 ENCOUNTER — Other Ambulatory Visit: Payer: Self-pay | Admitting: *Deleted

## 2010-12-03 MED ORDER — IBUPROFEN 800 MG PO TABS: 800.0000 mg | ORAL_TABLET | Freq: Three times a day (TID) | ORAL | Status: AC | PRN

## 2011-06-25 ENCOUNTER — Encounter (HOSPITAL_COMMUNITY): Payer: Self-pay | Admitting: *Deleted

## 2011-06-25 ENCOUNTER — Emergency Department (HOSPITAL_COMMUNITY)
Admission: EM | Admit: 2011-06-25 | Discharge: 2011-06-26 | Disposition: A | Discharge status: Home / Self Care | Payer: PRIVATE HEALTH INSURANCE | Attending: Emergency Medicine | Admitting: Emergency Medicine

## 2011-06-25 DIAGNOSIS — F17200 Nicotine dependence, unspecified, uncomplicated: Secondary | ICD-10-CM | POA: Insufficient documentation

## 2011-06-25 DIAGNOSIS — K625 Hemorrhage of anus and rectum: Secondary | ICD-10-CM

## 2011-06-25 DIAGNOSIS — K921 Melena: Secondary | ICD-10-CM | POA: Insufficient documentation

## 2011-06-25 HISTORY — DX: Other specified postprocedural states: Z98.890

## 2011-06-25 HISTORY — DX: Hemorrhage of anus and rectum: K62.5

## 2011-06-25 NOTE — ED Notes (Signed)
Pt has a HX of rectal bleeding, this episode started today around 5:30PM, "blood just drips in the toilet".

## 2011-06-25 NOTE — ED Provider Notes (Signed)
History    This chart was scribed for Benny Lennert, MD, MD by Smitty Pluck. The patient was seen in room APFT24/APFT24 and the patient's care was started at 11:22PM.   CSN: [id number]  Arrival date & time 06/25/11  2142   First MD Initiated Contact with Patient 06/25/11 2313      Chief Complaint  Patient presents with  . Rectal Bleeding    (Consider location/radiation/quality/duration/timing/severity/associated sxs/prior treatment) Patient is a 46 y.o. female presenting with hematochezia. The history is provided by the patient.  Rectal Bleeding  The current episode started today. The onset was sudden. The problem occurs frequently. The problem has been unchanged. The pain is moderate. The stool is described as liquid. There was no prior successful therapy. There was no prior unsuccessful therapy.   Autumn [name] is a 46 y.o. female who presents to the Emergency Department complaining of rectal bleeding onset today. Pt reports having hx of rectal bleeding and has had multiple colonoscopies that are nl. symptoms have been constant since onset. Reports that she has not had any stool but has blood. Reports moderate pain in rectum. There is no radiation of pain.   Past Medical History  Diagnosis Date  . Rectal bleeding   . H/O colonoscopy     Past Surgical History  Procedure Date  . Cesarean section   . Breast lumpectomy   . Dental surgery   . Tubal ligation     History reviewed. No pertinent family history.  History  Substance Use Topics  . Smoking status: Current Everyday Smoker -- 0.5 packs/day    Types: Cigarettes  . Smokeless tobacco: Not on file  . Alcohol Use: No    OB History    Grav Para Term Preterm Abortions TAB SAB Ect Mult Living                  Review of Systems  Gastrointestinal: Positive for hematochezia.  All other systems reviewed and are negative.    Allergies  Bee venom and Tramadol  Home Medications   Current Outpatient Rx    Name Route Sig Dispense Refill  . VARENICLINE TARTRATE 1 MG PO TABS Oral Take 1 mg by mouth 2 (two) times daily.      BP 136/81  Pulse 96  Temp(Src) 98.9 F (37.2 C) (Oral)  Resp 20  Ht 5\' 5"  (1.651 m)  Wt 230 lb (104.327 kg)  BMI 38.27 kg/m2  SpO2 100%  LMP 06/11/2011  Physical Exam  Nursing note and vitals reviewed. Constitutional: She is oriented to person, place, and time. She appears well-developed and well-nourished. No distress.  HENT:  Head: Normocephalic and atraumatic.  Eyes: Conjunctivae are normal. Pupils are equal, round, and reactive to light.  Neck: Normal range of motion. Neck supple.  Cardiovascular: Normal rate, regular rhythm and normal heart sounds.   Pulmonary/Chest: Effort normal and breath sounds normal.  Abdominal: Soft. She exhibits no distension.  Neurological: She is alert and oriented to person, place, and time.  Skin: Skin is warm and dry.  Psychiatric: She has a normal mood and affect. Her behavior is normal.    ED Course  Procedures (including critical care time) DIAGNOSTIC STUDIES: Oxygen Saturation is 100% on room air, normal by my interpretation.    COORDINATION OF CARE: 11:25PM EDP discusses pt ED treatment with pt     Labs Reviewed - No data to display No results found.   No diagnosis found.    MDM  The chart was scribed for me under my direct supervision.  I personally performed the history, physical, and medical decision making and all procedures in the evaluation of this patient.Benny Lennert, MD 06/26/11 [phone]

## 2011-06-26 LAB — DIFFERENTIAL
Basophils Relative: 1 % (ref 0–1)
Eosinophils Absolute: 0.3 10*3/uL (ref 0.0–0.7)
Eosinophils Relative: 3 % (ref 0–5)
Lymphocytes Relative: 43 % (ref 12–46)
Monocytes Relative: 8 % (ref 3–12)
Neutro Abs: 4.2 10*3/uL (ref 1.7–7.7)
Neutrophils Relative %: 45 % (ref 43–77)

## 2011-06-26 LAB — CBC
HCT: 37 % (ref 36.0–46.0)
Hemoglobin: 12.1 g/dL (ref 12.0–15.0)
MCHC: 32.7 g/dL (ref 30.0–36.0)
RBC: 5.25 MIL/uL — ABNORMAL HIGH (ref 3.87–5.11)

## 2011-06-26 NOTE — Discharge Instructions (Signed)
Follow up with your md next week. °

## 2011-06-26 NOTE — ED Notes (Signed)
Discharge instructions reviewed with pt, questions answered. Pt verbalized understanding.  

## 2011-08-19 ENCOUNTER — Emergency Department (HOSPITAL_COMMUNITY): Payer: PRIVATE HEALTH INSURANCE

## 2011-08-19 ENCOUNTER — Emergency Department (HOSPITAL_COMMUNITY)
Admission: EM | Admit: 2011-08-19 | Discharge: 2011-08-19 | Disposition: A | Discharge status: Home / Self Care | Payer: PRIVATE HEALTH INSURANCE | Attending: Emergency Medicine | Admitting: Emergency Medicine

## 2011-08-19 ENCOUNTER — Encounter (HOSPITAL_COMMUNITY): Payer: Self-pay | Admitting: *Deleted

## 2011-08-19 DIAGNOSIS — F17200 Nicotine dependence, unspecified, uncomplicated: Secondary | ICD-10-CM | POA: Insufficient documentation

## 2011-08-19 DIAGNOSIS — J40 Bronchitis, not specified as acute or chronic: Secondary | ICD-10-CM | POA: Insufficient documentation

## 2011-08-19 DIAGNOSIS — J029 Acute pharyngitis, unspecified: Secondary | ICD-10-CM | POA: Insufficient documentation

## 2011-08-19 LAB — RAPID STREP SCREEN (MED CTR MEBANE ONLY): Streptococcus, Group A Screen (Direct): NEGATIVE

## 2011-08-19 MED ORDER — GUAIFENESIN-CODEINE 100-10 MG/5ML PO SYRP: ORAL_SOLUTION | ORAL | Status: DC

## 2011-08-19 NOTE — ED Provider Notes (Signed)
History     CSN: [id number]  Arrival date & time 08/19/11  1549   First MD Initiated Contact with Patient 08/19/11 1612      Chief Complaint  Patient presents with  . Cough    (Consider location/radiation/quality/duration/timing/severity/associated sxs/prior treatment) HPI Comments: She also c/o "sharp" L upper sternal border pain with coughing.  Patient is a 46 y.o. female presenting with cough. The history is provided by the patient. No language interpreter was used.  Cough This is a new problem. Episode onset: 2 weeks ago. The problem occurs every few minutes. The cough is productive of purulent sputum. Maximum temperature: subjective fever. Associated symptoms include chest pain and sore throat. Pertinent negatives include no chills, no sweats, no shortness of breath and no wheezing. Treatments tried: saw dr. Janna Arch for same sxs and has completed a Z-pack with no improvement.   She is a smoker.    Past Medical History  Diagnosis Date  . Rectal bleeding   . H/O colonoscopy     Past Surgical History  Procedure Date  . Cesarean section   . Breast lumpectomy   . Dental surgery   . Tubal ligation     History reviewed. No pertinent family history.  History  Substance Use Topics  . Smoking status: Current Everyday Smoker -- 0.5 packs/day    Types: Cigarettes  . Smokeless tobacco: Not on file  . Alcohol Use: No    OB History    Grav Para Term Preterm Abortions TAB SAB Ect Mult Living                  Review of Systems  Constitutional: Negative for chills.  HENT: Positive for sore throat.   Respiratory: Positive for cough. Negative for shortness of breath and wheezing.   Cardiovascular: Positive for chest pain.  All other systems reviewed and are negative.    Allergies  Bee venom and Tramadol  Home Medications   Current Outpatient Rx  Name Route Sig Dispense Refill  . AZITHROMYCIN 250 MG PO TABS Oral Take 250 mg by mouth as directed. Take two tablets  on day 1, then take one tablet daily for 4 days      BP 144/77  Pulse 104  Temp 99.2 F (37.3 C) (Oral)  Resp 20  Ht 5\' 6"  (1.676 m)  Wt 240 lb (108.863 kg)  BMI 38.74 kg/m2  SpO2 100%  LMP 07/24/2011  Physical Exam  Nursing note and vitals reviewed. Constitutional: She is oriented to person, place, and time. She appears well-developed and well-nourished. No distress.  HENT:  Head: Normocephalic and atraumatic.  Mouth/Throat: Uvula is midline and mucous membranes are normal. No uvula swelling. Posterior oropharyngeal erythema present. No oropharyngeal exudate, posterior oropharyngeal edema or tonsillar abscesses.  Eyes: EOM are normal.  Neck: Normal range of motion.  Cardiovascular: Normal rate, regular rhythm and normal heart sounds.   Pulmonary/Chest: Effort normal and breath sounds normal. No respiratory distress. She has no decreased breath sounds. She has no wheezes. She has no rhonchi. She has no rales. She exhibits no tenderness.    Abdominal: Soft. She exhibits no distension. There is no tenderness.  Musculoskeletal: Normal range of motion.  Neurological: She is alert and oriented to person, place, and time.  Skin: Skin is warm and dry.  Psychiatric: She has a normal mood and affect. Judgment normal.    ED Course  Procedures (including critical care time)   Labs Reviewed  RAPID STREP SCREEN   Dg  Chest 2 View  08/19/2011  *RADIOLOGY REPORT*  Clinical Data: Cough, intermittent fever, history smoking, asthma  CHEST - 2 VIEW  Comparison: 10/19/2006  Findings: Normal heart size, mediastinal contours, and pulmonary vascularity. Lungs clear. No pleural effusion or pneumothorax. Bones unremarkable.  IMPRESSION: No acute abnormalities.  Original Report Authenticated By: Lollie Marrow, M.D.     1. Bronchitis   2. Pharyngitis       MDM  rx robitussin AC, 240 ml F/u with PCP prn        Evalina Field, PA 08/19/11 1713

## 2011-08-19 NOTE — ED Provider Notes (Signed)
Medical screening examination/treatment/procedure(s) were performed by non-physician practitioner and as supervising physician I was immediately available for consultation/collaboration.   Shaneca Orne L Tae Vonada, MD 08/19/11 1937 

## 2011-08-19 NOTE — ED Notes (Signed)
Cough for 2 weeks, yellow sputum, fever.  Has taken antibiotic and finished, continues to cough

## 2011-09-18 ENCOUNTER — Encounter (HOSPITAL_COMMUNITY): Payer: Self-pay | Admitting: *Deleted

## 2011-09-18 ENCOUNTER — Emergency Department (HOSPITAL_COMMUNITY)
Admission: EM | Admit: 2011-09-18 | Discharge: 2011-09-18 | Disposition: A | Discharge status: Home / Self Care | Payer: PRIVATE HEALTH INSURANCE | Attending: Emergency Medicine | Admitting: Emergency Medicine

## 2011-09-18 DIAGNOSIS — Z91038 Other insect allergy status: Secondary | ICD-10-CM | POA: Insufficient documentation

## 2011-09-18 DIAGNOSIS — L255 Unspecified contact dermatitis due to plants, except food: Secondary | ICD-10-CM | POA: Insufficient documentation

## 2011-09-18 DIAGNOSIS — Z888 Allergy status to other drugs, medicaments and biological substances status: Secondary | ICD-10-CM | POA: Insufficient documentation

## 2011-09-18 DIAGNOSIS — F17200 Nicotine dependence, unspecified, uncomplicated: Secondary | ICD-10-CM | POA: Insufficient documentation

## 2011-09-18 DIAGNOSIS — I10 Essential (primary) hypertension: Secondary | ICD-10-CM | POA: Insufficient documentation

## 2011-09-18 HISTORY — DX: Essential (primary) hypertension: I10

## 2011-09-18 MED ORDER — DIPHENHYDRAMINE HCL 25 MG PO CAPS
50.0000 mg | ORAL_CAPSULE | Freq: Once | ORAL | Status: AC
  Administered 2011-09-18: 50 mg via ORAL
  Filled 2011-09-18: qty 2

## 2011-09-18 MED ORDER — FAMOTIDINE 20 MG PO TABS
20.0000 mg | ORAL_TABLET | Freq: Once | ORAL | Status: AC
  Administered 2011-09-18: 20 mg via ORAL
  Filled 2011-09-18: qty 1

## 2011-09-18 MED ORDER — PREDNISONE 20 MG PO TABS
60.0000 mg | ORAL_TABLET | Freq: Once | ORAL | Status: AC
  Administered 2011-09-18: 60 mg via ORAL
  Filled 2011-09-18: qty 3

## 2011-09-18 MED ORDER — PREDNISONE 50 MG PO TABS: ORAL_TABLET | ORAL | Status: AC

## 2011-09-18 NOTE — ED Provider Notes (Signed)
History     CSN: [id number]  Arrival date & time 09/18/11  0818   First MD Initiated Contact with Patient 09/18/11 [phone]      Chief Complaint  Patient presents with  . Rash    (Consider location/radiation/quality/duration/timing/severity/associated sxs/prior treatment) HPI Comments: Pt thinks she came into contact with poison ivy 5 days ago.  Has taken several doses of benadryl without relief.  Last dose last PM.  Applied calamine lotion this AM.  Pt of dr. Janna Arch.  Patient is a 46 y.o. female presenting with rash. The history is provided by the patient. No language interpreter was used.  Rash  This is a new problem. Episode onset: 5 days ago. The problem has been gradually worsening. The problem is associated with plant contact. There has been no fever. Affected Location: Both forearms and R leg. The patient is experiencing no pain. The pain has been constant since onset. Associated symptoms include blisters, itching and weeping. Pertinent negatives include no pain. She has tried antihistamines for the symptoms. The treatment provided no relief.    Past Medical History  Diagnosis Date  . Rectal bleeding   . H/O colonoscopy   . Hypertension     Past Surgical History  Procedure Date  . Cesarean section   . Breast lumpectomy   . Dental surgery   . Tubal ligation     No family history on file.  History  Substance Use Topics  . Smoking status: Current Everyday Smoker -- 0.5 packs/day    Types: Cigarettes  . Smokeless tobacco: Not on file  . Alcohol Use: No    OB History    Grav Para Term Preterm Abortions TAB SAB Ect Mult Living                  Review of Systems  Constitutional: Negative for fever.  Respiratory: Negative for shortness of breath and wheezing.   Skin: Positive for itching and rash.  All other systems reviewed and are negative.    Allergies  Bee venom and Tramadol  Home Medications   Current Outpatient Rx  Name Route Sig Dispense Refill    . AZITHROMYCIN 250 MG PO TABS Oral Take 250 mg by mouth as directed. Take two tablets on day 1, then take one tablet daily for 4 days    . GUAIFENESIN-CODEINE 100-10 MG/5ML PO SYRP  10 ml q 4-6 hrs prn cough 240 mL 0  . PREDNISONE 50 MG PO TABS  One tab po QD 5 tablet 0    BP 151/112  Pulse 100  Temp 98.2 F (36.8 C) (Oral)  Resp 20  Ht 5\' 6"  (1.676 m)  Wt 210 lb (95.255 kg)  BMI 33.89 kg/m2  SpO2 98%  LMP 08/23/2011  Physical Exam  Nursing note and vitals reviewed. Constitutional: She is oriented to person, place, and time. She appears well-developed and well-nourished. No distress.  HENT:  Head: Normocephalic and atraumatic.  Eyes: EOM are normal.  Neck: Normal range of motion.  Cardiovascular: Normal rate, regular rhythm and normal heart sounds.   Pulmonary/Chest: Effort normal and breath sounds normal.  Abdominal: Soft. She exhibits no distension. There is no tenderness.  Musculoskeletal: Normal range of motion.       Red, blistery, weeping rash to R popliteal fossa c/w rhus dermatitis.  Finer, less symptomatic rash beginning on B forearms.  Neurological: She is alert and oriented to person, place, and time.  Skin: Skin is warm and dry. Rash noted.  Psychiatric:  She has a normal mood and affect. Judgment normal.    ED Course  Procedures (including critical care time)  Labs Reviewed - No data to display No results found.   1. Rhus dermatitis       MDM  rx-pred 50 mg, 5 Benadryl 50 mg QID pepcid 20 mg BID F/u with PCP prn        Evalina Field, PA 09/18/11 [phone]

## 2011-09-18 NOTE — ED Notes (Signed)
Reports bumpy, itchy rash to posterior right leg, right arm, and perineal area x 1 wk.  Reports taking OTC benadryl with no relief.

## 2011-09-18 NOTE — ED Provider Notes (Signed)
Medical screening examination/treatment/procedure(s) were performed by non-physician practitioner and as supervising physician I was immediately available for consultation/collaboration.  Raeford Razor, MD 09/18/11 [phone]

## 2011-09-27 ENCOUNTER — Emergency Department (HOSPITAL_COMMUNITY)
Admission: EM | Admit: 2011-09-27 | Discharge: 2011-09-27 | Disposition: A | Discharge status: Home / Self Care | Payer: PRIVATE HEALTH INSURANCE | Attending: Emergency Medicine | Admitting: Emergency Medicine

## 2011-09-27 ENCOUNTER — Encounter (HOSPITAL_COMMUNITY): Payer: Self-pay | Admitting: Emergency Medicine

## 2011-09-27 DIAGNOSIS — X500XXA Overexertion from strenuous movement or load, initial encounter: Secondary | ICD-10-CM | POA: Insufficient documentation

## 2011-09-27 DIAGNOSIS — IMO0002 Reserved for concepts with insufficient information to code with codable children: Secondary | ICD-10-CM | POA: Insufficient documentation

## 2011-09-27 DIAGNOSIS — F17200 Nicotine dependence, unspecified, uncomplicated: Secondary | ICD-10-CM | POA: Insufficient documentation

## 2011-09-27 DIAGNOSIS — Y9389 Activity, other specified: Secondary | ICD-10-CM | POA: Insufficient documentation

## 2011-09-27 DIAGNOSIS — Y998 Other external cause status: Secondary | ICD-10-CM | POA: Insufficient documentation

## 2011-09-27 DIAGNOSIS — I10 Essential (primary) hypertension: Secondary | ICD-10-CM | POA: Insufficient documentation

## 2011-09-27 DIAGNOSIS — S46911A Strain of unspecified muscle, fascia and tendon at shoulder and upper arm level, right arm, initial encounter: Secondary | ICD-10-CM

## 2011-09-27 MED ORDER — NAPROXEN 250 MG PO TABS
500.0000 mg | ORAL_TABLET | Freq: Once | ORAL | Status: AC
  Administered 2011-09-27: 500 mg via ORAL
  Filled 2011-09-27: qty 2

## 2011-09-27 MED ORDER — NAPROXEN 500 MG PO TABS: 500.0000 mg | ORAL_TABLET | Freq: Two times a day (BID) | ORAL | Status: AC

## 2011-09-27 NOTE — ED Provider Notes (Signed)
History     CSN: [id number]  Arrival date & time 09/27/11  0241   None     Chief Complaint  Patient presents with  . Shoulder Pain    (Consider location/radiation/quality/duration/timing/severity/associated sxs/prior treatment) HPI Comments: 46 year old female who presents approximately 36 hours after sustaining acute onset of pain in her right shoulder while she was trying to move a heavy shelf. She states that she felt a pop in the shoulder, the pain is worse with movement, worse with laying on it and worse with using the arm to try to get up from a sitting or lying position. There is no associated numbness or weakness of the arm and there is no associated chest pain. She has had no medications prior to arrival.  Patient is a 46 y.o. female presenting with shoulder pain.  Shoulder Pain    Past Medical History  Diagnosis Date  . Rectal bleeding   . H/O colonoscopy   . Hypertension     Past Surgical History  Procedure Date  . Cesarean section   . Breast lumpectomy   . Dental surgery   . Tubal ligation     History reviewed. No pertinent family history.  History  Substance Use Topics  . Smoking status: Current Everyday Smoker -- 0.5 packs/day    Types: Cigarettes  . Smokeless tobacco: Not on file  . Alcohol Use: No    OB History    Grav Para Term Preterm Abortions TAB SAB Ect Mult Living                  Review of Systems  Skin: Negative for rash.  Neurological: Negative for weakness and numbness.    Allergies  Bee venom and Tramadol  Home Medications   Current Outpatient Rx  Name Route Sig Dispense Refill  . AZITHROMYCIN 250 MG PO TABS Oral Take 250 mg by mouth as directed. Take two tablets on day 1, then take one tablet daily for 4 days    . GUAIFENESIN-CODEINE 100-10 MG/5ML PO SYRP  10 ml q 4-6 hrs prn cough 240 mL 0  . NAPROXEN 500 MG PO TABS Oral Take 1 tablet (500 mg total) by mouth 2 (two) times daily with a meal. 30 tablet 0  . PREDNISONE 50 MG  PO TABS  One tab po QD 5 tablet 0    BP 148/82  Pulse 110  Temp 98.4 F (36.9 C) (Oral)  Resp 18  Ht 5\' 6"  (1.676 m)  Wt 240 lb (108.863 kg)  BMI 38.74 kg/m2  SpO2 99%  LMP 08/23/2011  Physical Exam  Nursing note and vitals reviewed. Constitutional: She appears well-developed and well-nourished. No distress.  HENT:  Head: Normocephalic and atraumatic.  Eyes: Conjunctivae are normal. No scleral icterus.  Cardiovascular: Normal rate, regular rhythm and intact distal pulses.        Normal peripheral pulses at the radial arteries the right upper extremity  Pulmonary/Chest: Effort normal and breath sounds normal.  Musculoskeletal: She exhibits tenderness ( Over the anterior lateral right deltoid. No pain with range of motion of the shoulder other than with abduction.). She exhibits no edema.       No pain with internal or external rotation of the shoulder, no pain at the elbow or wrist.  Neurological: She is alert.       Normal sensation to light touch and pinprick along the right upper extremity, normal gait, normal speech  Skin: Skin is warm and dry. No rash noted. She  is not diaphoretic.    ED Course  Procedures (including critical care time)  Labs Reviewed - No data to display No results found.   1. Strain of right shoulder       MDM  The patient has a strain of the right shoulder, there is no deformity, she has good range of motion other than abduction. Will treat with a sling, anti-inflammatories and followup. Patient is in agreement with this plan and is benign in appearance, stable for discharge.        Vida Roller, MD 09/27/11 [phone]

## 2011-09-27 NOTE — ED Notes (Signed)
Patient states she was moving furniture yesterday and felt a pop in her right shoulder. Complaining of pain in right shoulder and unable to sleep.

## 2011-11-11 ENCOUNTER — Other Ambulatory Visit (HOSPITAL_COMMUNITY): Payer: Self-pay | Admitting: Family Medicine

## 2011-11-11 DIAGNOSIS — Z139 Encounter for screening, unspecified: Secondary | ICD-10-CM

## 2011-11-30 ENCOUNTER — Ambulatory Visit (HOSPITAL_COMMUNITY): Payer: PRIVATE HEALTH INSURANCE

## 2011-12-10 ENCOUNTER — Ambulatory Visit (HOSPITAL_COMMUNITY)
Admission: RE | Admit: 2011-12-10 | Discharge: 2011-12-10 | Disposition: A | Discharge status: Home / Self Care | Payer: PRIVATE HEALTH INSURANCE | Source: Ambulatory Visit | Attending: Family Medicine | Admitting: Family Medicine

## 2011-12-10 DIAGNOSIS — Z139 Encounter for screening, unspecified: Secondary | ICD-10-CM

## 2011-12-10 DIAGNOSIS — Z1231 Encounter for screening mammogram for malignant neoplasm of breast: Secondary | ICD-10-CM | POA: Insufficient documentation

## 2012-02-12 ENCOUNTER — Other Ambulatory Visit (HOSPITAL_COMMUNITY): Payer: Self-pay | Admitting: Family Medicine

## 2012-02-12 ENCOUNTER — Ambulatory Visit (HOSPITAL_COMMUNITY)
Admission: RE | Admit: 2012-02-12 | Discharge: 2012-02-12 | Disposition: A | Discharge status: Home / Self Care | Payer: PRIVATE HEALTH INSURANCE | Source: Ambulatory Visit | Attending: Family Medicine | Admitting: Family Medicine

## 2012-02-12 DIAGNOSIS — M25519 Pain in unspecified shoulder: Secondary | ICD-10-CM

## 2012-03-15 ENCOUNTER — Other Ambulatory Visit (HOSPITAL_COMMUNITY): Payer: Self-pay | Admitting: Family Medicine

## 2012-03-15 DIAGNOSIS — M542 Cervicalgia: Secondary | ICD-10-CM

## 2012-03-18 ENCOUNTER — Ambulatory Visit (HOSPITAL_COMMUNITY)
Admission: RE | Admit: 2012-03-18 | Discharge: 2012-03-18 | Disposition: A | Discharge status: Home / Self Care | Payer: PRIVATE HEALTH INSURANCE | Source: Ambulatory Visit | Attending: Family Medicine | Admitting: Family Medicine

## 2012-03-18 DIAGNOSIS — M542 Cervicalgia: Secondary | ICD-10-CM | POA: Insufficient documentation

## 2012-06-03 ENCOUNTER — Encounter (HOSPITAL_COMMUNITY): Payer: Self-pay

## 2012-06-03 ENCOUNTER — Emergency Department (HOSPITAL_COMMUNITY): Payer: PRIVATE HEALTH INSURANCE

## 2012-06-03 ENCOUNTER — Emergency Department (HOSPITAL_COMMUNITY)
Admission: EM | Admit: 2012-06-03 | Discharge: 2012-06-03 | Disposition: A | Discharge status: Home / Self Care | Payer: PRIVATE HEALTH INSURANCE | Attending: Emergency Medicine | Admitting: Emergency Medicine

## 2012-06-03 DIAGNOSIS — Y92009 Unspecified place in unspecified non-institutional (private) residence as the place of occurrence of the external cause: Secondary | ICD-10-CM | POA: Insufficient documentation

## 2012-06-03 DIAGNOSIS — S93409A Sprain of unspecified ligament of unspecified ankle, initial encounter: Secondary | ICD-10-CM | POA: Insufficient documentation

## 2012-06-03 DIAGNOSIS — F17200 Nicotine dependence, unspecified, uncomplicated: Secondary | ICD-10-CM | POA: Insufficient documentation

## 2012-06-03 DIAGNOSIS — Z8719 Personal history of other diseases of the digestive system: Secondary | ICD-10-CM | POA: Insufficient documentation

## 2012-06-03 DIAGNOSIS — Y9389 Activity, other specified: Secondary | ICD-10-CM | POA: Insufficient documentation

## 2012-06-03 DIAGNOSIS — I10 Essential (primary) hypertension: Secondary | ICD-10-CM | POA: Insufficient documentation

## 2012-06-03 MED ORDER — HYDROCODONE-ACETAMINOPHEN 5-325 MG PO TABS: 1.0000 | ORAL_TABLET | ORAL | Status: DC | PRN

## 2012-06-03 NOTE — ED Notes (Signed)
Twisted right ankle about 1 week ago per pt. Hurts to walk on it per pt.

## 2012-06-03 NOTE — ED Notes (Signed)
Patient with no complaints at this time. Respirations even and unlabored. Skin warm/dry. Discharge instructions reviewed with patient at this time. Patient given opportunity to voice concerns/ask questions. Patient discharged at this time and left Emergency Department with steady gait.   

## 2012-06-03 NOTE — ED Provider Notes (Signed)
History     CSN: [id number]  Arrival date & time 06/03/12  4098   First MD Initiated Contact with Patient 06/03/12 0800      Chief Complaint  Patient presents with  . Ankle Injury    (Consider location/radiation/quality/duration/timing/severity/associated sxs/prior treatment) Patient is a 47 y.o. female presenting with lower extremity injury. The history is provided by the patient.  Ankle Injury This is a new problem. The current episode started in the past 7 days. The problem occurs constantly. The problem has been unchanged. Pertinent negatives include no chest pain, chills, fever, headaches, nausea, neck pain or vomiting. The symptoms are aggravated by standing and walking. She has tried ice and NSAIDs for the symptoms. The treatment provided no relief.   [name] is a 47 y.o. female who presents to the ED with right ankle pain. The pain started a week ago. She states that she was at home and got out of her car and stepped in a hole in the yard and twisted the ankle. She took ibuprofen and applied ice without relief. The pain and swelling has continued so she decided to come in to have it checked out.  Past Medical History  Diagnosis Date  . Rectal bleeding   . H/O colonoscopy   . Hypertension     Past Surgical History  Procedure Laterality Date  . Cesarean section    . Breast lumpectomy    . Dental surgery    . Tubal ligation      No family history on file.  History  Substance Use Topics  . Smoking status: Current Every Day Smoker -- 0.50 packs/day    Types: Cigarettes  . Smokeless tobacco: Not on file  . Alcohol Use: No    OB History   Grav Para Term Preterm Abortions TAB SAB Ect Mult Living                  Review of Systems  Constitutional: Negative for fever and chills.  HENT: Negative for neck pain.   Respiratory: Negative for shortness of breath.   Cardiovascular: Negative for chest pain.  Gastrointestinal: Negative for nausea and  vomiting.  Musculoskeletal:       Right ankle pain  Skin: Negative for wound.  Allergic/Immunologic: Negative for immunocompromised state.  Neurological: Negative for light-headedness and headaches.  Psychiatric/Behavioral: The patient is not nervous/anxious.     Allergies  Bee venom and Tramadol  Home Medications   Current Outpatient Rx  Name  Route  Sig  Dispense  Refill  . ibuprofen (ADVIL,MOTRIN) 200 MG tablet   Oral   Take 400 mg by mouth every 6 (six) hours as needed for pain (ankle pain).         Marland Kitchen azithromycin (ZITHROMAX Z-PAK) 250 MG tablet   Oral   Take 250 mg by mouth as directed. Take two tablets on day 1, then take one tablet daily for 4 days         . guaiFENesin-codeine (ROBITUSSIN AC) 100-10 MG/5ML syrup      10 ml q 4-6 hrs prn cough   240 mL   0   . naproxen (NAPROSYN) 500 MG tablet   Oral   Take 1 tablet (500 mg total) by mouth 2 (two) times daily with a meal.   30 tablet   0     BP 121/65  Pulse 104  Temp(Src) 97.3 F (36.3 C) (Oral)  Resp 16  Ht 5\' 5"  (1.651 m)  Wt  220 lb (99.791 kg)  BMI 36.61 kg/m2  SpO2 100%  LMP 05/28/2012  Physical Exam  Nursing note and vitals reviewed. Constitutional: She is oriented to person, place, and time. She appears well-developed and well-nourished.  HENT:  Head: Normocephalic and atraumatic.  Eyes: EOM are normal.  Neck: Neck supple.  Pulmonary/Chest: Effort normal.  Musculoskeletal:       Right ankle: She exhibits decreased range of motion and swelling. She exhibits no deformity and normal pulse. Tenderness. Medial malleolus tenderness found. Achilles tendon normal.       Feet:  Pedal pulses equal bilateral. Adequate circulation, good touch sensation. Pain with palpation and range of motion of medial aspect of the right ankle. Pain radiates to the lower leg.  Neurological: She is alert and oriented to person, place, and time. No cranial nerve deficit.  Skin: Skin is warm and dry.  Psychiatric:  She has a normal mood and affect. Her behavior is normal. Judgment and thought content normal.   Dg Ankle Complete Right  06/03/2012  *RADIOLOGY REPORT*  Clinical Data: Ankle injury, pain and swelling injury 1 week ago  RIGHT ANKLE - COMPLETE 3+ VIEW  Comparison: None.  Findings: Three views of the right ankle submitted.  No acute fracture or subluxation.  Ankle mortise is preserved.  Diffuse soft tissue swelling.  IMPRESSION: No acute fracture or subluxation.  Diffuse soft tissue swelling.   Original Report Authenticated By: Natasha Mead, M.D.     Assessment: 47 y.o. female with right ankle pain   Ankle sprain  Plan:  ASO, ice, elevate, NSAID's   Follow up with ortho if symptoms persist.   ED Course  Procedures  MDM  I have reviewed this patient's vital signs, nurses notes, appropriate imaging.  I have discussed findings and plan of care with the patient and she voices understanding.    Medication List    TAKE these medications       HYDROcodone-acetaminophen 5-325 MG per tablet  Commonly known as:  NORCO/VICODIN  Take 1 tablet by mouth every 4 (four) hours as needed.      ASK your doctor about these medications       guaiFENesin-codeine 100-10 MG/5ML syrup  Commonly known as:  ROBITUSSIN AC  10 ml q 4-6 hrs prn cough     ibuprofen 200 MG tablet  Commonly known as:  ADVIL,MOTRIN  Take 400 mg by mouth every 6 (six) hours as needed for pain (ankle pain).     naproxen 500 MG tablet  Commonly known as:  NAPROSYN  Take 1 tablet (500 mg total) by mouth 2 (two) times daily with a meal.     ZITHROMAX Z-PAK 250 MG tablet  Generic drug:  azithromycin  Take 250 mg by mouth as directed. Take two tablets on day 1, then take one tablet daily for 4 days               [facility], NP 06/03/12 [phone]

## 2012-06-03 NOTE — Discharge Instructions (Signed)
Your x-ray today shows no fracture of dislocation. Wear the splint for comfort, apply ice, elevate and take ibuprofen regularly. If the pain continues, follow up with Dr. Romeo Apple.  Ankle Sprain An ankle sprain is an injury to the strong, fibrous tissues (ligaments) that hold the bones of your ankle joint together.  CAUSES An ankle sprain is usually caused by a fall or by twisting your ankle. Ankle sprains most commonly occur when you step on the outer edge of your foot, and your ankle turns inward. People who participate in sports are more prone to these types of injuries.  SYMPTOMS   Pain in your ankle. The pain may be present at rest or only when you are trying to stand or walk.  Swelling.  Bruising. Bruising may develop immediately or within 1 to 2 days after your injury.  Difficulty standing or walking, particularly when turning corners or changing directions. DIAGNOSIS  Your caregiver will ask you details about your injury and perform a physical exam of your ankle to determine if you have an ankle sprain. During the physical exam, your caregiver will press on and apply pressure to specific areas of your foot and ankle. Your caregiver will try to move your ankle in certain ways. An X-ray exam may be done to be sure a bone was not broken or a ligament did not separate from one of the bones in your ankle (avulsion fracture).  TREATMENT  Certain types of braces can help stabilize your ankle. Your caregiver can make a recommendation for this. Your caregiver may recommend the use of medicine for pain. If your sprain is severe, your caregiver may refer you to a surgeon who helps to restore function to parts of your skeletal system (orthopedist) or a physical therapist. HOME CARE INSTRUCTIONS   Apply ice to your injury for 1 to 2 days or as directed by your caregiver. Applying ice helps to reduce inflammation and pain.  Put ice in a plastic bag.  Place a towel between your skin and the  bag.  Leave the ice on for 15 to 20 minutes at a time, every 2 hours while you are awake.  Only take over-the-counter or prescription medicines for pain, discomfort, or fever as directed by your caregiver.  Keep your injured leg elevated, when possible, to lessen swelling.  If your caregiver recommends crutches, use them as instructed. Gradually put weight on the affected ankle. Continue to use crutches or a cane until you can walk without feeling pain in your ankle.  If you have a plaster splint, wear the splint as directed by your caregiver. Do not rest it on anything harder than a pillow for the first 24 hours. Do not put weight on it. Do not get it wet. You may take it off to take a shower or bath.  You may have been given an elastic bandage to wear around your ankle to provide support. If the elastic bandage is too tight (you have numbness or tingling in your foot or your foot becomes cold and blue), adjust the bandage to make it comfortable.  If you have an air splint, you may blow more air into it or let air out to make it more comfortable. You may take your splint off at night and before taking a shower or bath.  Wiggle your toes in the splint several times per day to decrease swelling. SEEK MEDICAL CARE IF:   You have an increase in bruising, swelling, or pain.  Your toes  feel extremely cold or you lose feeling in your foot.  Your pain is not relieved with medicine. SEEK IMMEDIATE MEDICAL CARE IF:  Your toes are numb or blue.  You have severe pain. MAKE SURE YOU:   Understand these instructions.  Will watch your condition.  Will get help right away if you are not doing well or get worse. Document Released: 01/12/2005 Document Revised: 04/06/2011 Document Reviewed: 01/24/2011 [facility] Patient Information 2013 [place], [place].

## 2012-06-05 NOTE — ED Provider Notes (Signed)
Medical screening examination/treatment/procedure(s) were performed by non-physician practitioner and as supervising physician I was immediately available for consultation/collaboration.  Donnetta Hutching, MD 06/05/12 1515

## 2012-06-08 ENCOUNTER — Ambulatory Visit: Payer: PRIVATE HEALTH INSURANCE | Admitting: Orthopedic Surgery

## 2012-06-15 ENCOUNTER — Encounter: Payer: Self-pay | Admitting: Orthopedic Surgery

## 2012-06-15 ENCOUNTER — Ambulatory Visit (INDEPENDENT_AMBULATORY_CARE_PROVIDER_SITE_OTHER): Payer: PRIVATE HEALTH INSURANCE | Admitting: Orthopedic Surgery

## 2012-06-15 VITALS — BP 130/82 | Ht 65.0 in | Wt 245.0 lb

## 2012-06-15 DIAGNOSIS — S93409A Sprain of unspecified ligament of unspecified ankle, initial encounter: Secondary | ICD-10-CM

## 2012-06-15 DIAGNOSIS — S93401A Sprain of unspecified ligament of right ankle, initial encounter: Secondary | ICD-10-CM

## 2012-06-15 MED ORDER — HYDROCODONE-ACETAMINOPHEN 5-325 MG PO TABS: 1.0000 | ORAL_TABLET | ORAL | Status: DC | PRN

## 2012-06-15 MED ORDER — IBUPROFEN 200 MG PO TABS: 400.0000 mg | ORAL_TABLET | Freq: Four times a day (QID) | ORAL | Status: DC | PRN

## 2012-06-15 NOTE — Patient Instructions (Addendum)
Call to arrange therapy 

## 2012-06-15 NOTE — Progress Notes (Signed)
Patient ID: Autumn [name], female   DOB: November 20, 1965, 47 y.o.   MRN: [id number] Chief Complaint  Patient presents with  . Ankle Injury    Right ankle pain, injury 5 weeks ago. ER on 06-03-12.    History this is a 47 year old African American female injured her ankle 5 weeks ago still having lateral pain and pain in her Achilles tendon. She rolled ankle at home stepped in a hole felt a pop went to the emergency room a few weeks later x-rays were negative she was given an ASO brace and told to followup with orthopedics if needed. She continues to have sharp throbbing stabbing 8/10 pain which is worse with walking most of her pain is in the Achilles. Review of systems she reports weight gain unsteady gait easy bruising otherwise the other 11 systems were normal  Her heart condition includes atrial flutter  Past Medical History  Diagnosis Date  . Rectal bleeding   . H/O colonoscopy   . Hypertension     BP 130/82  Ht 5\' 5"  (1.651 m)  Wt 245 lb (111.131 kg)  BMI 40.77 kg/m2  LMP 05/28/2012 General appearance is normal, the patient is alert and oriented x3 with normal mood and affect. She is having difficulty ambulating she is limping  Her ankle is swollen and tender over the ATFL tenderness in the Achilles but an intact tendon is painful dorsiflexion of the foot but has 10 dorsiflexion she has painful inversion. She has a trace to 1+ anterior drawer bilaterally. Motor exam is normal scans intact pulses are good sensation is normal.  X-ray reviewed with reports no fracture  Ankle sprain recommend Cam Walker and physical therapy continue Norco and ibuprofen.  Weight-bear as tolerated  Follow up in 6 weeks.

## 2012-06-27 ENCOUNTER — Ambulatory Visit (HOSPITAL_COMMUNITY)
Admission: RE | Admit: 2012-06-27 | Discharge: 2012-06-27 | Disposition: A | Discharge status: Home / Self Care | Payer: PRIVATE HEALTH INSURANCE | Source: Ambulatory Visit | Attending: Orthopedic Surgery | Admitting: Orthopedic Surgery

## 2012-06-27 DIAGNOSIS — M25676 Stiffness of unspecified foot, not elsewhere classified: Secondary | ICD-10-CM | POA: Insufficient documentation

## 2012-06-27 DIAGNOSIS — R262 Difficulty in walking, not elsewhere classified: Secondary | ICD-10-CM | POA: Insufficient documentation

## 2012-06-27 DIAGNOSIS — M25579 Pain in unspecified ankle and joints of unspecified foot: Secondary | ICD-10-CM | POA: Insufficient documentation

## 2012-06-27 DIAGNOSIS — M25673 Stiffness of unspecified ankle, not elsewhere classified: Secondary | ICD-10-CM | POA: Insufficient documentation

## 2012-06-27 DIAGNOSIS — IMO0001 Reserved for inherently not codable concepts without codable children: Secondary | ICD-10-CM | POA: Insufficient documentation

## 2012-06-27 NOTE — Evaluation (Signed)
Physical Therapy Evaluation  Patient Details  Name: Autumn [name] MRN: [id number] Date of Birth: 1965-07-12 Charge:  evaluation Today's Date: 06/27/2012 Time: 7829-5621 PT Time Calculation (min): 40 min              Visit#: 1 of 12  Re-eval: 07/27/12 Assessment Diagnosis: sprain ankle Next MD Visit: 07/03/2012 Prior Therapy: none  Authorization: UHC/Medicaid      Past Medical History:  Past Medical History  Diagnosis Date  . Rectal bleeding   . H/O colonoscopy   . Hypertension    Past Surgical History:  Past Surgical History  Procedure Laterality Date  . Cesarean section    . Breast lumpectomy    . Dental surgery    . Tubal ligation      Subjective Symptoms/Limitations Symptoms: Ms. Mol states that she injured her ankle eight weeks ago stepping in a hole.  The patient went to the ER x-ray were (-) for fracture.  She was referred to an orthopedist if sx did not resolve.  Her orthopedist placed her in a camboot and has now referred her to physical therapy. How long can you sit comfortably?: no difficulty How long can you stand comfortably?: less than two minutes How long can you walk comfortably?: less than three minutes  Patient Stated Goals: less pain, out of boot,  Pain Assessment Currently in Pain?: Yes Pain Score:   8 (standing pain is a 10/10) Pain Location: Ankle Pain Orientation: Right Pain Type: Chronic pain Pain Onset: More than a month ago Pain Frequency: Constant Pain Relieving Factors: lying down Effect of Pain on Daily Activities: increases pain.  Precautions/Restrictions  Precautions Precautions: None Required Braces or Orthoses: Other Brace/Splint Other Brace/Splint: cam boot  Balance Screening Balance Screen Has the patient fallen in the past 6 months: Yes How many times?:  (1) Has the patient had a decrease in activity level because of a fear of falling? : Yes Is the patient reluctant to leave their home because of a fear of  falling? : No  Prior Functioning  Prior Function Vocation: On disability Leisure: Hobbies-no Comments: play with grandchildren  Sensation/Coordination/Flexibility/Functional Tests Functional Tests Functional Tests: figure 8 R 55 cm; L 55.5 cm  Assessment RLE AROM (degrees) Right Ankle Dorsiflexion: -30 Right Ankle Plantar Flexion: 40 Right Ankle Inversion: 8 Right Ankle Eversion: 2 RLE Strength Right Ankle Dorsiflexion: 2-/5 Right Ankle Plantar Flexion: 2-/5 Right Ankle Inversion: 2-/5 Right Ankle Eversion: 2-/5  Exercise/Treatments     Ankle Exercises - Seated ABC's: Limitations ABC's Limitations: A-M Other Seated Ankle Exercises: ankle Dorsiflexion/plantarflexion Ankle Exercises - Supine Other Supine Ankle Exercises: DF/PF; In/Eversion x 10     Physical Therapy Assessment and Plan PT Assessment and Plan Clinical Impression Statement: Pt with chronic ankle sprain who has decreased ROM, strength and increased pain who activity level has been significantly decreased.  Pt will benefit from skilled PT to improve the above mentioned and increase pt functional level. Pt will benefit from skilled therapeutic intervention in order to improve on the following deficits: Abnormal gait;Decreased range of motion;Pain;Difficulty walking;Decreased strength Rehab Potential: Good PT Frequency: Min 3X/week PT Duration: 4 weeks PT Treatment/Interventions: Gait training;Therapeutic activities;Therapeutic exercise;Patient/family education;Manual techniques;Modalities PT Plan: Begin isometric ex, towel in/eversion both long sitting and sitting as well as towel DF stretch while long sitting next treatment.  Progresss to FROM then begin strengthening.  Manual and modalities may be used to decrease pain.    Goals Home Exercise Program Pt will Perform Home Exercise Program:  Independently PT Short Term Goals Time to Complete Short Term Goals: 2 weeks PT Short Term Goal 1: ROM to be improved by  15 degrees for DF to allow more normalized gt  PT Short Term Goal 2: strength to be improved by 1/2 grade to allow pt walk to restroom in the middle of the night without her castboot PT Short Term Goal 3: pain to be no greater than a 7/10 to improve comfort PT Short Term Goal 4: able to stand for 20 minutes PT Short Term Goal 5: able to walk for 15 minutes PT Long Term Goals Time to Complete Long Term Goals: 4 weeks PT Long Term Goal 1: I in advance HEP PT Long Term Goal 2: ROM to be wfl to allow a normalized heel toe gt  Long Term Goal 3: strength to be improved over one grade to allow pt to come out of the camboot Long Term Goal 4: Pt to be able to SLS for 15 seconds to improve proprioception to decrease risk of second sprain. PT Long Term Goal 5: able to walk for 30 minutes to allow pt to do short shopping steps.  Problem List Patient Active Problem List   Diagnosis Date Noted  . Stiffness of joint, not elsewhere classified, ankle and foot 06/27/2012  . Pain in joint, ankle and foot 06/27/2012  . Difficulty walking 06/27/2012  . Ankle sprain 06/15/2012  . KNEE SPRAIN 09/16/2009  . ANKLE SPRAIN, RIGHT 09/16/2009  . ANEMIA 03/07/2008  . CIGARETTE SMOKER 03/07/2008  . ABDOMINAL BLOATING 03/07/2008  . ABDOMINAL PAIN, LEFT UPPER QUADRANT 03/07/2008  . ABDOMINAL PAIN, LEFT LOWER QUADRANT 03/07/2008  . CONSTIPATION, CHRONIC, HX OF 03/07/2008    General Behavior During Therapy: [facility] for tasks assessed/performed Cognition: [facility] for tasks performed PT Plan of Care PT Home Exercise Plan: given  GP    RUSSELL,CINDY 06/27/2012, 4:14 PM  Physician Documentation Your signature is required to indicate approval of the treatment plan as stated above.  Please sign and either send electronically or make a copy of this report for your files and return this physician signed original.   Please mark one 1.__approve of plan  2. ___approve of plan with the following  conditions.   ______________________________                                                          _____________________ Physician Signature                                                                                                             Date

## 2012-06-29 ENCOUNTER — Ambulatory Visit (HOSPITAL_COMMUNITY)
Admission: RE | Admit: 2012-06-29 | Discharge: 2012-06-29 | Disposition: A | Discharge status: Home / Self Care | Payer: PRIVATE HEALTH INSURANCE | Source: Ambulatory Visit | Attending: Orthopedic Surgery | Admitting: Orthopedic Surgery

## 2012-06-29 NOTE — Progress Notes (Signed)
Physical Therapy Treatment Patient Details  Name: Autumn [name] MRN: [id number] Date of Birth: [birth year]  Today's Date: 06/29/2012 Time: 4098-1191 PT Time Calculation (min): 45 min  Visit#: 2 of 12   Re-eval: 07/27/12 Charges: Therex x 32' Manual x 10'  Authorization: UHC/Medicaid  Authorization Visit#: 2 of 10   Subjective: Symptoms/Limitations Symptoms: Pt reports HEP compliance. Pain Assessment Currently in Pain?: Yes Pain Score:   7 Pain Location: Ankle Pain Orientation: Right Pain Type: Chronic pain   Exercise/Treatments  Ankle Exercises - Seated Towel Crunch: Limitations Towel Crunch Limitations: 2' Towel Inversion/Eversion: 3 reps Marble Pickup: 1 RT Other Seated Ankle Exercises: ankle Dorsiflexion/plantarflexion x 10  Manual Therapy Manual Therapy: Joint mobilization Joint Mobilization: Gentle PROM all directions; grade I calcaneal mobs   Physical Therapy Assessment and Plan PT Assessment and Plan Clinical Impression Statement: Pt is hypersensitive to touch on plantar surface of R foot. Pt completes therex well after multimodal cueing for proper technique. Pt presents with tightness and weakness throughout R ankle which is limiting function. Pt will benefit from skilled therapeutic intervention in order to improve on the following deficits: Abnormal gait;Decreased range of motion;Pain;Difficulty walking;Decreased strength Rehab Potential: Good PT Frequency: Min 3X/week PT Duration: 4 weeks PT Treatment/Interventions: Gait training;Therapeutic activities;Therapeutic exercise;Patient/family education;Manual techniques;Modalities PT Plan: Begin isometric ex, towel in/eversion both long sitting and sitting as well as towel DF stretch while long sitting next treatment.  Progresss to FROM then begin strengthening.  Manual and modalities may be used to decrease pain.    Goals    Problem List Patient Active Problem List   Diagnosis Date Noted  .  Stiffness of joint, not elsewhere classified, ankle and foot 06/27/2012  . Pain in joint, ankle and foot 06/27/2012  . Difficulty walking 06/27/2012  . Ankle sprain 06/15/2012  . KNEE SPRAIN 09/16/2009  . ANKLE SPRAIN, RIGHT 09/16/2009  . ANEMIA 03/07/2008  . CIGARETTE SMOKER 03/07/2008  . ABDOMINAL BLOATING 03/07/2008  . ABDOMINAL PAIN, LEFT UPPER QUADRANT 03/07/2008  . ABDOMINAL PAIN, LEFT LOWER QUADRANT 03/07/2008  . CONSTIPATION, CHRONIC, HX OF 03/07/2008    PT - End of Session Activity Tolerance: Patient tolerated treatment well General Behavior During Therapy: WFL for tasks assessed/performed Cognition: WFL for tasks performed PT Plan of Care PT Home Exercise Plan: given  Seth Bake, PTA  06/29/2012, 3:39 PM

## 2012-07-01 ENCOUNTER — Ambulatory Visit (HOSPITAL_COMMUNITY): Payer: PRIVATE HEALTH INSURANCE | Admitting: *Deleted

## 2012-07-01 ENCOUNTER — Telehealth (HOSPITAL_COMMUNITY): Payer: Self-pay

## 2012-07-04 ENCOUNTER — Ambulatory Visit (HOSPITAL_COMMUNITY): Payer: PRIVATE HEALTH INSURANCE | Admitting: *Deleted

## 2012-07-04 ENCOUNTER — Other Ambulatory Visit: Payer: Self-pay | Admitting: *Deleted

## 2012-07-04 DIAGNOSIS — S93401A Sprain of unspecified ligament of right ankle, initial encounter: Secondary | ICD-10-CM

## 2012-07-04 MED ORDER — HYDROCODONE-ACETAMINOPHEN 5-325 MG PO TABS: 1.0000 | ORAL_TABLET | ORAL | Status: DC | PRN

## 2012-07-06 ENCOUNTER — Ambulatory Visit (HOSPITAL_COMMUNITY): Payer: PRIVATE HEALTH INSURANCE | Admitting: *Deleted

## 2012-07-11 ENCOUNTER — Ambulatory Visit (HOSPITAL_COMMUNITY): Payer: PRIVATE HEALTH INSURANCE | Admitting: Physical Therapy

## 2012-07-13 ENCOUNTER — Ambulatory Visit (HOSPITAL_COMMUNITY): Payer: PRIVATE HEALTH INSURANCE | Admitting: Physical Therapy

## 2012-07-15 ENCOUNTER — Ambulatory Visit (HOSPITAL_COMMUNITY): Payer: PRIVATE HEALTH INSURANCE | Admitting: Physical Therapy

## 2012-08-02 ENCOUNTER — Encounter: Payer: Self-pay | Admitting: Orthopedic Surgery

## 2012-08-02 ENCOUNTER — Ambulatory Visit: Payer: PRIVATE HEALTH INSURANCE | Admitting: Orthopedic Surgery

## 2012-09-17 ENCOUNTER — Emergency Department (HOSPITAL_COMMUNITY)
Admission: EM | Admit: 2012-09-17 | Discharge: 2012-09-17 | Disposition: A | Discharge status: Home / Self Care | Payer: PRIVATE HEALTH INSURANCE | Attending: Emergency Medicine | Admitting: Emergency Medicine

## 2012-09-17 ENCOUNTER — Encounter (HOSPITAL_COMMUNITY): Payer: Self-pay | Admitting: *Deleted

## 2012-09-17 DIAGNOSIS — T63461A Toxic effect of venom of wasps, accidental (unintentional), initial encounter: Secondary | ICD-10-CM | POA: Insufficient documentation

## 2012-09-17 DIAGNOSIS — R000 Tachycardia, unspecified: Secondary | ICD-10-CM | POA: Insufficient documentation

## 2012-09-17 DIAGNOSIS — Y9389 Activity, other specified: Secondary | ICD-10-CM | POA: Insufficient documentation

## 2012-09-17 DIAGNOSIS — F17200 Nicotine dependence, unspecified, uncomplicated: Secondary | ICD-10-CM | POA: Insufficient documentation

## 2012-09-17 DIAGNOSIS — T6391XA Toxic effect of contact with unspecified venomous animal, accidental (unintentional), initial encounter: Secondary | ICD-10-CM | POA: Insufficient documentation

## 2012-09-17 DIAGNOSIS — R21 Rash and other nonspecific skin eruption: Secondary | ICD-10-CM | POA: Insufficient documentation

## 2012-09-17 DIAGNOSIS — Z9889 Other specified postprocedural states: Secondary | ICD-10-CM | POA: Insufficient documentation

## 2012-09-17 DIAGNOSIS — M25419 Effusion, unspecified shoulder: Secondary | ICD-10-CM | POA: Insufficient documentation

## 2012-09-17 DIAGNOSIS — I10 Essential (primary) hypertension: Secondary | ICD-10-CM | POA: Insufficient documentation

## 2012-09-17 DIAGNOSIS — Y929 Unspecified place or not applicable: Secondary | ICD-10-CM | POA: Insufficient documentation

## 2012-09-17 DIAGNOSIS — Z8719 Personal history of other diseases of the digestive system: Secondary | ICD-10-CM | POA: Insufficient documentation

## 2012-09-17 MED ORDER — DIPHENHYDRAMINE HCL 25 MG PO CAPS
25.0000 mg | ORAL_CAPSULE | Freq: Once | ORAL | Status: AC
  Administered 2012-09-17: 25 mg via ORAL
  Filled 2012-09-17: qty 1

## 2012-09-17 MED ORDER — FAMOTIDINE 20 MG PO TABS: 20.0000 mg | ORAL_TABLET | Freq: Two times a day (BID) | ORAL | Status: DC

## 2012-09-17 MED ORDER — FAMOTIDINE 20 MG PO TABS
20.0000 mg | ORAL_TABLET | Freq: Once | ORAL | Status: AC
  Administered 2012-09-17: 20 mg via ORAL
  Filled 2012-09-17: qty 1

## 2012-09-17 MED ORDER — DIPHENHYDRAMINE HCL 25 MG PO TABS: 25.0000 mg | ORAL_TABLET | Freq: Four times a day (QID) | ORAL | Status: DC

## 2012-09-17 MED ORDER — PREDNISONE 50 MG PO TABS
60.0000 mg | ORAL_TABLET | Freq: Once | ORAL | Status: AC
  Administered 2012-09-17: 60 mg via ORAL
  Filled 2012-09-17: qty 1

## 2012-09-17 NOTE — ED Provider Notes (Signed)
CSN: [id number]     Arrival date & time 09/17/12  2210 History     First MD Initiated Contact with Patient 09/17/12 2237     Chief Complaint  Patient presents with  . Insect Bite   (Consider location/radiation/quality/duration/timing/severity/associated sxs/prior Treatment) The history is provided by the patient.   EUGENIE HAREWOOD is a 47 y.o. female who presents to the ED with a bee sting to her left forearm. The sting occurred today approximately 5 pm. She complains of redness, swelling and itching. She denies nausea or vomiting, fever or chills. No shortness of breath.  Past Medical History  Diagnosis Date  . Rectal bleeding   . H/O colonoscopy   . Hypertension    Past Surgical History  Procedure Laterality Date  . Cesarean section    . Breast lumpectomy    . Dental surgery    . Tubal ligation     Family History  Problem Relation Age of Onset  . Heart disease     History  Substance Use Topics  . Smoking status: Current Every Day Smoker -- 0.50 packs/day    Types: Cigarettes  . Smokeless tobacco: Not on file  . Alcohol Use: No   OB History   Grav Para Term Preterm Abortions TAB SAB Ect Mult Living                 Review of Systems  Constitutional: Negative for fever and chills.  HENT: Negative for neck pain.   Eyes: Negative for redness.  Respiratory: Negative for shortness of breath and wheezing.   Gastrointestinal: Negative for nausea, vomiting and abdominal pain.  Musculoskeletal: Negative for back pain.  Skin: Positive for rash.  Neurological: Negative for light-headedness and headaches.  Psychiatric/Behavioral: The patient is not nervous/anxious.     Allergies  Bee venom and Tramadol  Home Medications   Current Outpatient Rx  Name  Route  Sig  Dispense  Refill  . azithromycin (ZITHROMAX Z-PAK) 250 MG tablet   Oral   Take 250 mg by mouth as directed. Take two tablets on day 1, then take one tablet daily for 4 days         .  guaiFENesin-codeine (ROBITUSSIN AC) 100-10 MG/5ML syrup      10 ml q 4-6 hrs prn cough   240 mL   0   . HYDROcodone-acetaminophen (NORCO/VICODIN) 5-325 MG per tablet   Oral   Take 1 tablet by mouth every 4 (four) hours as needed.   40 tablet   0   . ibuprofen (ADVIL,MOTRIN) 200 MG tablet   Oral   Take 2 tablets (400 mg total) by mouth every 6 (six) hours as needed for pain (ankle pain).   90 tablet   0   . naproxen (NAPROSYN) 500 MG tablet   Oral   Take 1 tablet (500 mg total) by mouth 2 (two) times daily with a meal.   30 tablet   0    BP 128/88  Pulse 106  Temp(Src) 98.5 F (36.9 C) (Oral)  Resp 18  Ht 5\' 5"  (1.651 m)  Wt 240 lb (108.863 kg)  BMI 39.94 kg/m2  SpO2 100%  LMP 08/28/2012 Physical Exam  Nursing note and vitals reviewed. Constitutional: She is oriented to person, place, and time. She appears well-developed and well-nourished. No distress.  HENT:  Head: Normocephalic.  Nose: Nose normal.  Mouth/Throat: Uvula is midline, oropharynx is clear and moist and mucous membranes are normal.  Eyes: EOM are normal.  Neck: Normal range of motion. Neck supple.  Cardiovascular: Tachycardia present.   Pulmonary/Chest: Effort normal and breath sounds normal. No respiratory distress. She has no wheezes.  Abdominal: Soft. There is no tenderness.  Musculoskeletal: Normal range of motion.       Right shoulder: She exhibits swelling. She exhibits normal range of motion, no tenderness, no deformity, normal pulse and normal strength.       Arms: There is approximately 2 cm area that is red and slightly raised with an area in the center where the bee sting occurred.   Neurological: She is alert and oriented to person, place, and time. No cranial nerve deficit.  Skin: Skin is warm and dry. Rash: bee sting.  Psychiatric: She has a normal mood and affect. Her behavior is normal.    ED Course   Procedures MDM  47 y.o. female with local reaction to bee sting.  Will treat  with Benadryl, Pepcid and Prednisone. She will return as needed for any problems. She will apply cool compresses to the area. Patient stable for discharge home without any immediate complications. O2 Sat 100% on RA. Discussed with the patient and all questioned fully answered.   [address] [place], [place] 09/18/12 [phone]

## 2012-09-17 NOTE — ED Notes (Addendum)
Pt got stung by a wasp on her left forearm around 5pm. Pt c/o itching.

## 2012-09-18 NOTE — ED Provider Notes (Signed)
Medical screening examination/treatment/procedure(s) were performed by non-physician practitioner and as supervising physician I was immediately available for consultation/collaboration.  Hurman Horn, MD 09/18/12 1249

## 2012-10-06 ENCOUNTER — Emergency Department (HOSPITAL_COMMUNITY)
Admission: EM | Admit: 2012-10-06 | Discharge: 2012-10-06 | Disposition: A | Discharge status: Home / Self Care | Payer: PRIVATE HEALTH INSURANCE | Attending: Emergency Medicine | Admitting: Emergency Medicine

## 2012-10-06 ENCOUNTER — Emergency Department (HOSPITAL_COMMUNITY): Payer: PRIVATE HEALTH INSURANCE

## 2012-10-06 ENCOUNTER — Encounter (HOSPITAL_COMMUNITY): Payer: Self-pay | Admitting: *Deleted

## 2012-10-06 DIAGNOSIS — Z8719 Personal history of other diseases of the digestive system: Secondary | ICD-10-CM | POA: Insufficient documentation

## 2012-10-06 DIAGNOSIS — R509 Fever, unspecified: Secondary | ICD-10-CM | POA: Insufficient documentation

## 2012-10-06 DIAGNOSIS — Z9851 Tubal ligation status: Secondary | ICD-10-CM | POA: Insufficient documentation

## 2012-10-06 DIAGNOSIS — F17200 Nicotine dependence, unspecified, uncomplicated: Secondary | ICD-10-CM | POA: Insufficient documentation

## 2012-10-06 DIAGNOSIS — IMO0001 Reserved for inherently not codable concepts without codable children: Secondary | ICD-10-CM | POA: Insufficient documentation

## 2012-10-06 DIAGNOSIS — I10 Essential (primary) hypertension: Secondary | ICD-10-CM | POA: Insufficient documentation

## 2012-10-06 DIAGNOSIS — J209 Acute bronchitis, unspecified: Secondary | ICD-10-CM | POA: Insufficient documentation

## 2012-10-06 DIAGNOSIS — Z79899 Other long term (current) drug therapy: Secondary | ICD-10-CM | POA: Insufficient documentation

## 2012-10-06 DIAGNOSIS — R000 Tachycardia, unspecified: Secondary | ICD-10-CM | POA: Insufficient documentation

## 2012-10-06 DIAGNOSIS — R0602 Shortness of breath: Secondary | ICD-10-CM | POA: Insufficient documentation

## 2012-10-06 MED ORDER — AZITHROMYCIN 250 MG PO TABS: 250.0000 mg | ORAL_TABLET | Freq: Every day | ORAL | Status: DC

## 2012-10-06 MED ORDER — ALBUTEROL SULFATE (5 MG/ML) 0.5% IN NEBU
2.5000 mg | INHALATION_SOLUTION | Freq: Once | RESPIRATORY_TRACT | Status: AC
  Administered 2012-10-06: 2.5 mg via RESPIRATORY_TRACT
  Filled 2012-10-06: qty 0.5

## 2012-10-06 MED ORDER — IPRATROPIUM BROMIDE 0.02 % IN SOLN
0.5000 mg | Freq: Once | RESPIRATORY_TRACT | Status: AC
  Administered 2012-10-06: 0.5 mg via RESPIRATORY_TRACT
  Filled 2012-10-06: qty 2.5

## 2012-10-06 MED ORDER — GUAIFENESIN-CODEINE 100-10 MG/5ML PO SOLN
10.0000 mL | Freq: Once | ORAL | Status: AC
  Administered 2012-10-06: 10 mL via ORAL
  Filled 2012-10-06: qty 10

## 2012-10-06 MED ORDER — PREDNISONE 20 MG PO TABS
40.0000 mg | ORAL_TABLET | Freq: Once | ORAL | Status: AC
  Administered 2012-10-06: 40 mg via ORAL
  Filled 2012-10-06: qty 2

## 2012-10-06 MED ORDER — GUAIFENESIN-CODEINE 100-10 MG/5ML PO SYRP: 5.0000 mL | ORAL_SOLUTION | Freq: Three times a day (TID) | ORAL | Status: DC | PRN

## 2012-10-06 MED ORDER — PREDNISONE 10 MG PO TABS: 20.0000 mg | ORAL_TABLET | Freq: Two times a day (BID) | ORAL | Status: DC

## 2012-10-06 NOTE — ED Notes (Signed)
Cough for the past 2 weeks with pain in left lateral rib area for the past 4 days

## 2012-10-06 NOTE — ED Provider Notes (Signed)
Medical screening examination/treatment/procedure(s) were performed by non-physician practitioner and as supervising physician I was immediately available for consultation/collaboration.  Arihana Ambrocio, MD 10/06/12 1722 

## 2012-10-06 NOTE — ED Provider Notes (Signed)
CSN: [id number]     Arrival date & time 10/06/12  1431 History   First MD Initiated Contact with Patient 10/06/12 1504     Chief Complaint  Patient presents with  . Cough   (Consider location/radiation/quality/duration/timing/severity/associated sxs/prior Treatment) Patient is a 47 y.o. female presenting with cough. The history is provided by the patient.  Cough Cough characteristics:  Productive Sputum characteristics:  Yellow Severity:  Moderate Onset quality:  Gradual Duration:  3 weeks Timing:  Intermittent Progression:  Worsening Chronicity:  New Smoker: yes   Relieved by:  Nothing Worsened by:  Smoking and lying down Ineffective treatments:  Home nebulizer Associated symptoms: fever, myalgias, shortness of breath and wheezing   Associated symptoms: no chest pain, no ear pain, no headaches, no rash, no sinus congestion and no sore throat     Past Medical History  Diagnosis Date  . Rectal bleeding   . H/O colonoscopy   . Hypertension    Past Surgical History  Procedure Laterality Date  . Cesarean section    . Breast lumpectomy    . Dental surgery    . Tubal ligation     Family History  Problem Relation Age of Onset  . Heart disease     History  Substance Use Topics  . Smoking status: Current Every Day Smoker -- 0.50 packs/day    Types: Cigarettes  . Smokeless tobacco: Not on file  . Alcohol Use: No   OB History   Grav Para Term Preterm Abortions TAB SAB Ect Mult Living                 Review of Systems  Constitutional: Positive for fever.  HENT: Negative for ear pain and sore throat.   Respiratory: Positive for cough, shortness of breath and wheezing.   Cardiovascular: Negative for chest pain.  Musculoskeletal: Positive for myalgias.  Skin: Negative for rash.  Neurological: Negative for headaches.    Allergies  Bee venom and Tramadol  Home Medications   Current Outpatient Rx  Name  Route  Sig  Dispense  Refill  . azithromycin (ZITHROMAX  Z-PAK) 250 MG tablet   Oral   Take 250 mg by mouth as directed. Take two tablets on day 1, then take one tablet daily for 4 days         . diphenhydrAMINE (BENADRYL) 25 MG tablet   Oral   Take 1 tablet (25 mg total) by mouth every 6 (six) hours.   20 tablet   0   . famotidine (PEPCID) 20 MG tablet   Oral   Take 1 tablet (20 mg total) by mouth 2 (two) times daily.   30 tablet   0   . guaiFENesin-codeine (ROBITUSSIN AC) 100-10 MG/5ML syrup      10 ml q 4-6 hrs prn cough   240 mL   0   . HYDROcodone-acetaminophen (NORCO/VICODIN) 5-325 MG per tablet   Oral   Take 1 tablet by mouth every 4 (four) hours as needed.   40 tablet   0   . ibuprofen (ADVIL,MOTRIN) 200 MG tablet   Oral   Take 2 tablets (400 mg total) by mouth every 6 (six) hours as needed for pain (ankle pain).   90 tablet   0    BP 124/88  Pulse 106  Temp(Src) 98.4 F (36.9 C)  Resp 20  Ht 5\' 4"  (1.626 m)  Wt 240 lb (108.863 kg)  BMI 41.18 kg/m2  SpO2 97%  LMP 08/28/2012 Physical Exam  Nursing note and vitals reviewed. Constitutional: She is oriented to person, place, and time. She appears well-developed and well-nourished.  HENT:  Head: Normocephalic.  Eyes: EOM are normal.  Neck: Neck supple.  Cardiovascular: Tachycardia present.   Pulmonary/Chest: Effort normal. She has decreased breath sounds. She has wheezes. She has rhonchi.  Abdominal: Soft. There is no tenderness.  Musculoskeletal: Normal range of motion.  Neurological: She is alert and oriented to person, place, and time. No cranial nerve deficit.  Skin: Skin is warm and dry.  Psychiatric: She has a normal mood and affect. Her behavior is normal.    ED Course  Procedures Dg Chest 2 View  10/06/2012   CLINICAL DATA:  Shortness of breath, congestion.  EXAM: CHEST  2 VIEW  COMPARISON:  08/19/2011  FINDINGS: Heart and mediastinal contours are within normal limits. No focal opacities or effusions. No acute bony abnormality.  IMPRESSION: No  active cardiopulmonary disease.   Electronically Signed   By: Charlett Nose M.D.   On: 10/06/2012 15:54   1624 pm Re examine patient. Feeling better after neb. Treatment and Robitussin AC. Lungs clear.   MDM  47 y.o. female with acute bronchitis and left rib pain from coughing. I have reviewed this patient's vital signs, nurses notes, appropriate imaging and discussed findings and plan of care with the patient. Patient voices understanding. She is stable for discharge and will follow up with her PCP or return here if symptoms worsen.   Medication List    TAKE these medications       azithromycin 250 MG tablet  Commonly known as:  ZITHROMAX  Take 1 tablet (250 mg total) by mouth daily. Take first 2 tablets together, then 1 every day until finished.     guaiFENesin-codeine 100-10 MG/5ML syrup  Commonly known as:  ROBITUSSIN AC  Take 5 mLs by mouth 3 (three) times daily as needed for cough.     predniSONE 10 MG tablet  Commonly known as:  DELTASONE  Take 2 tablets (20 mg total) by mouth 2 (two) times daily.      ASK your doctor about these medications       calcium carbonate 500 MG chewable tablet  Commonly known as:  TUMS - dosed in mg elemental calcium  Chew 1 tablet by mouth daily as needed for heartburn.            Janne Napoleon, [place] 10/06/12 [phone]

## 2012-10-06 NOTE — ED Notes (Signed)
Pt seen and evaluated by EDNP for initial assessment. 

## 2012-11-07 ENCOUNTER — Other Ambulatory Visit (HOSPITAL_COMMUNITY): Payer: Self-pay | Admitting: Family Medicine

## 2012-11-07 DIAGNOSIS — Z139 Encounter for screening, unspecified: Secondary | ICD-10-CM

## 2012-12-13 ENCOUNTER — Ambulatory Visit (HOSPITAL_COMMUNITY): Payer: PRIVATE HEALTH INSURANCE

## 2013-06-30 ENCOUNTER — Emergency Department (HOSPITAL_COMMUNITY)
Admission: EM | Admit: 2013-06-30 | Discharge: 2013-06-30 | Disposition: A | Discharge status: Home / Self Care | Payer: PRIVATE HEALTH INSURANCE | Attending: Emergency Medicine | Admitting: Emergency Medicine

## 2013-06-30 ENCOUNTER — Encounter (HOSPITAL_COMMUNITY): Payer: Self-pay | Admitting: Emergency Medicine

## 2013-06-30 ENCOUNTER — Emergency Department (HOSPITAL_COMMUNITY): Payer: PRIVATE HEALTH INSURANCE

## 2013-06-30 DIAGNOSIS — S99919A Unspecified injury of unspecified ankle, initial encounter: Secondary | ICD-10-CM | POA: Diagnosis present

## 2013-06-30 DIAGNOSIS — X500XXA Overexertion from strenuous movement or load, initial encounter: Secondary | ICD-10-CM | POA: Diagnosis not present

## 2013-06-30 DIAGNOSIS — Z79899 Other long term (current) drug therapy: Secondary | ICD-10-CM | POA: Diagnosis not present

## 2013-06-30 DIAGNOSIS — Y929 Unspecified place or not applicable: Secondary | ICD-10-CM | POA: Insufficient documentation

## 2013-06-30 DIAGNOSIS — M722 Plantar fascial fibromatosis: Secondary | ICD-10-CM | POA: Diagnosis not present

## 2013-06-30 DIAGNOSIS — I10 Essential (primary) hypertension: Secondary | ICD-10-CM | POA: Insufficient documentation

## 2013-06-30 DIAGNOSIS — Y9389 Activity, other specified: Secondary | ICD-10-CM | POA: Diagnosis not present

## 2013-06-30 DIAGNOSIS — F17200 Nicotine dependence, unspecified, uncomplicated: Secondary | ICD-10-CM | POA: Insufficient documentation

## 2013-06-30 DIAGNOSIS — S8990XA Unspecified injury of unspecified lower leg, initial encounter: Secondary | ICD-10-CM | POA: Diagnosis present

## 2013-06-30 MED ORDER — DICLOFENAC SODIUM 75 MG PO TBEC: 75.0000 mg | DELAYED_RELEASE_TABLET | Freq: Two times a day (BID) | ORAL | Status: DC

## 2013-06-30 NOTE — ED Provider Notes (Signed)
CSN: [id number]     Arrival date & time 06/30/13  0604 History   First MD Initiated Contact with Patient 06/30/13 0612     No chief complaint on file.    (Consider location/radiation/quality/duration/timing/severity/associated sxs/prior Treatment) HPI Comments: Patient presents with pain in left ankle and foot. She reports that she twisted her ankle 6 months ago while playing with her grandchild. She has had pain ever since. Lately she has noticed pain on the bottom of the foot and heel area, worsens when she stands for long periods of time or walks. She also reports pain in the right knee. "Gave out" on her the other day. No swelling, redness.   Past Medical History  Diagnosis Date  . Rectal bleeding   . H/O colonoscopy   . Hypertension    Past Surgical History  Procedure Laterality Date  . Cesarean section    . Breast lumpectomy    . Dental surgery    . Tubal ligation     Family History  Problem Relation Age of Onset  . Heart disease     History  Substance Use Topics  . Smoking status: Current Every Day Smoker -- 0.50 packs/day    Types: Cigarettes  . Smokeless tobacco: Not on file  . Alcohol Use: No   OB History   Grav Para Term Preterm Abortions TAB SAB Ect Mult Living                 Review of Systems  Musculoskeletal: Positive for arthralgias.  Skin: Negative.   Neurological: Negative.       Allergies  Bee venom and Tramadol  Home Medications   Prior to Admission medications   Medication Sig Start Date End Date Taking? Authorizing Provider  azithromycin (ZITHROMAX) 250 MG tablet Take 1 tablet (250 mg total) by mouth daily. Take first 2 tablets together, then 1 every day until finished. 10/06/12   Hope Bunnie Pion, NP  calcium carbonate (TUMS - DOSED IN MG ELEMENTAL CALCIUM) 500 MG chewable tablet Chew 1 tablet by mouth daily as needed for heartburn.    Historical Provider, MD  guaiFENesin-codeine (ROBITUSSIN AC) 100-10 MG/5ML syrup Take 5 mLs by mouth 3  (three) times daily as needed for cough. 10/06/12   Hope Bunnie Pion, NP  predniSONE (DELTASONE) 10 MG tablet Take 2 tablets (20 mg total) by mouth 2 (two) times daily. 10/06/12   Hope Bunnie Pion, NP   BP 134/66  Pulse 85  Temp(Src) 98.2 F (36.8 C) (Oral)  Resp 20  Ht 5\' 5"  (1.651 m)  Wt 210 lb (95.255 kg)  BMI 34.95 kg/m2  SpO2 100%  LMP 06/28/2013 Physical Exam  Musculoskeletal:       Right knee: She exhibits normal range of motion, no swelling, no effusion, no ecchymosis, no deformity, no laceration, no erythema and normal alignment.       Feet:    ED Course  Procedures (including critical care time) Labs Review Labs Reviewed - No data to display  Imaging Review No results found.   EKG Interpretation None      MDM   Final diagnoses:  None   Plantar Fasciitis  Presents with pain primarily in the arch of the left foot. She reports her worsens when she stands and walks. She does report an injury 6 months ago when she twisted her ankle, but there is no swelling or malleolus tenderness of the ankle. No imaging necessary for the ankle. Foot x-ray was obtained. No acute abnormalities.  Patient is complaining of right knee pain. Examination does reveal any abnormality, however. There is no evidence of effusion. No concern for infection or septic joint. There does not appear to be any ligamentous instability.  Patient was told that she needs to rest areas as thick as possible. She needs to avoid standing for long periods of time on the left foot. His anti-inflammatory medication when it is painful.   Orpah Greek, MD 06/30/13 [phone]

## 2013-06-30 NOTE — ED Notes (Signed)
Twisted left ankle when she stepped in a hole chasing her grandchild. Pt also reports pain to right knee.

## 2013-06-30 NOTE — ED Notes (Signed)
Patient with no complaints at this time. Respirations even and unlabored. Skin warm/dry. Discharge instructions reviewed with patient at this time. Patient given opportunity to voice concerns/ask questions. Patient discharged at this time and left Emergency Department with steady gait.   

## 2013-06-30 NOTE — Discharge Instructions (Signed)

## 2013-07-30 ENCOUNTER — Encounter (HOSPITAL_COMMUNITY): Payer: Self-pay | Admitting: Emergency Medicine

## 2013-07-30 ENCOUNTER — Emergency Department (HOSPITAL_COMMUNITY)
Admission: EM | Admit: 2013-07-30 | Discharge: 2013-07-30 | Disposition: A | Discharge status: Home / Self Care | Payer: PRIVATE HEALTH INSURANCE | Attending: Emergency Medicine | Admitting: Emergency Medicine

## 2013-07-30 DIAGNOSIS — S7011XA Contusion of right thigh, initial encounter: Secondary | ICD-10-CM

## 2013-07-30 DIAGNOSIS — S7000XA Contusion of unspecified hip, initial encounter: Secondary | ICD-10-CM | POA: Insufficient documentation

## 2013-07-30 DIAGNOSIS — Z8719 Personal history of other diseases of the digestive system: Secondary | ICD-10-CM | POA: Diagnosis not present

## 2013-07-30 DIAGNOSIS — Y9389 Activity, other specified: Secondary | ICD-10-CM | POA: Insufficient documentation

## 2013-07-30 DIAGNOSIS — I10 Essential (primary) hypertension: Secondary | ICD-10-CM | POA: Insufficient documentation

## 2013-07-30 DIAGNOSIS — F17200 Nicotine dependence, unspecified, uncomplicated: Secondary | ICD-10-CM | POA: Diagnosis not present

## 2013-07-30 DIAGNOSIS — Z792 Long term (current) use of antibiotics: Secondary | ICD-10-CM | POA: Diagnosis not present

## 2013-07-30 DIAGNOSIS — W108XXA Fall (on) (from) other stairs and steps, initial encounter: Secondary | ICD-10-CM | POA: Insufficient documentation

## 2013-07-30 DIAGNOSIS — Z791 Long term (current) use of non-steroidal anti-inflammatories (NSAID): Secondary | ICD-10-CM | POA: Insufficient documentation

## 2013-07-30 DIAGNOSIS — IMO0002 Reserved for concepts with insufficient information to code with codable children: Secondary | ICD-10-CM | POA: Diagnosis not present

## 2013-07-30 DIAGNOSIS — S79919A Unspecified injury of unspecified hip, initial encounter: Secondary | ICD-10-CM | POA: Diagnosis present

## 2013-07-30 DIAGNOSIS — S7010XA Contusion of unspecified thigh, initial encounter: Secondary | ICD-10-CM | POA: Insufficient documentation

## 2013-07-30 DIAGNOSIS — Y929 Unspecified place or not applicable: Secondary | ICD-10-CM | POA: Insufficient documentation

## 2013-07-30 DIAGNOSIS — Z79899 Other long term (current) drug therapy: Secondary | ICD-10-CM | POA: Diagnosis not present

## 2013-07-30 DIAGNOSIS — S7001XA Contusion of right hip, initial encounter: Secondary | ICD-10-CM

## 2013-07-30 MED ORDER — ACETAMINOPHEN-CODEINE #3 300-30 MG PO TABS
2.0000 | ORAL_TABLET | Freq: Once | ORAL | Status: AC
  Administered 2013-07-30: 2 via ORAL
  Filled 2013-07-30: qty 2

## 2013-07-30 MED ORDER — ACETAMINOPHEN-CODEINE #3 300-30 MG PO TABS: 1.0000 | ORAL_TABLET | Freq: Four times a day (QID) | ORAL | Status: DC | PRN

## 2013-07-30 MED ORDER — METHOCARBAMOL 500 MG PO TABS: 500.0000 mg | ORAL_TABLET | Freq: Three times a day (TID) | ORAL | Status: DC

## 2013-07-30 MED ORDER — ONDANSETRON HCL 4 MG PO TABS
4.0000 mg | ORAL_TABLET | Freq: Once | ORAL | Status: AC
  Administered 2013-07-30: 4 mg via ORAL
  Filled 2013-07-30: qty 1

## 2013-07-30 MED ORDER — METHOCARBAMOL 500 MG PO TABS
1000.0000 mg | ORAL_TABLET | Freq: Once | ORAL | Status: AC
  Administered 2013-07-30: 1000 mg via ORAL
  Filled 2013-07-30: qty 2

## 2013-07-30 NOTE — ED Notes (Signed)
Fall down 2 step yesterday, c/o pain right hip since then. No relief with ice tx and ibuprofen

## 2013-07-30 NOTE — ED Provider Notes (Signed)
Medical screening examination/treatment/procedure(s) were performed by non-physician practitioner and as supervising physician I was immediately available for consultation/collaboration.   EKG Interpretation None        Fredia Sorrow, MD 07/30/13 2159

## 2013-07-30 NOTE — ED Provider Notes (Signed)
CSN: [id number]     Arrival date & time 07/30/13  2031 History   First MD Initiated Contact with Patient 07/30/13 2126     Chief Complaint  Patient presents with  . Hip Pain     (Consider location/radiation/quality/duration/timing/severity/associated sxs/prior Treatment) Patient is a 48 y.o. female presenting with hip pain. The history is provided by the patient.  Hip Pain This is a new problem. The current episode started yesterday. The problem occurs constantly. The problem has been gradually worsening. Pertinent negatives include no abdominal pain, arthralgias, chest pain, coughing, neck pain, numbness or weakness. Associated symptoms comments: Right hip and thigh pain. The symptoms are aggravated by standing and walking. She has tried NSAIDs for the symptoms. The treatment provided no relief.    Past Medical History  Diagnosis Date  . Rectal bleeding   . H/O colonoscopy   . Hypertension    Past Surgical History  Procedure Laterality Date  . Cesarean section    . Breast lumpectomy    . Dental surgery    . Tubal ligation     Family History  Problem Relation Age of Onset  . Heart disease     History  Substance Use Topics  . Smoking status: Current Every Day Smoker -- 0.50 packs/day    Types: Cigarettes  . Smokeless tobacco: Not on file  . Alcohol Use: No   OB History   Grav Para Term Preterm Abortions TAB SAB Ect Mult Living                 Review of Systems  Constitutional: Negative for activity change.       All ROS Neg except as noted in HPI  HENT: Negative for nosebleeds.   Eyes: Negative for photophobia and discharge.  Respiratory: Negative for cough, shortness of breath and wheezing.   Cardiovascular: Negative for chest pain and palpitations.  Gastrointestinal: Negative for abdominal pain and blood in stool.  Genitourinary: Negative for dysuria, frequency and hematuria.  Musculoskeletal: Negative for arthralgias, back pain and neck pain.  Skin: Negative.    Neurological: Negative for dizziness, seizures, speech difficulty, weakness and numbness.  Psychiatric/Behavioral: Negative for hallucinations and confusion.      Allergies  Bee venom and Tramadol  Home Medications   Prior to Admission medications   Medication Sig Start Date End Date Taking? Authorizing Provider  azithromycin (ZITHROMAX) 250 MG tablet Take 1 tablet (250 mg total) by mouth daily. Take first 2 tablets together, then 1 every day until finished. 10/06/12   Hope Bunnie Pion, NP  calcium carbonate (TUMS - DOSED IN MG ELEMENTAL CALCIUM) 500 MG chewable tablet Chew 1 tablet by mouth daily as needed for heartburn.    Historical Provider, MD  diclofenac (VOLTAREN) 75 MG EC tablet Take 1 tablet (75 mg total) by mouth 2 (two) times daily. 06/30/13   Orpah Greek, MD  guaiFENesin-codeine [facility]) 100-10 MG/5ML syrup Take 5 mLs by mouth 3 (three) times daily as needed for cough. 10/06/12   Hope Bunnie Pion, NP  predniSONE (DELTASONE) 10 MG tablet Take 2 tablets (20 mg total) by mouth 2 (two) times daily. 10/06/12   Hope Bunnie Pion, NP   BP 112/74  Pulse 100  Temp(Src) 98 F (36.7 C) (Oral)  Resp 16  Ht 5\' 7"  (1.702 m)  Wt 210 lb (95.255 kg)  BMI 32.88 kg/m2  SpO2 98%  LMP 06/28/2013 Physical Exam  Nursing note and vitals reviewed. Constitutional: She is oriented to person, place,  and time. She appears well-developed and well-nourished.  Non-toxic appearance.  HENT:  Head: Normocephalic.  Right Ear: Tympanic membrane and external ear normal.  Left Ear: Tympanic membrane and external ear normal.  Eyes: EOM and lids are normal. Pupils are equal, round, and reactive to light.  Neck: Normal range of motion. Neck supple. Carotid bruit is not present.  Cardiovascular: Normal rate, regular rhythm, normal heart sounds, intact distal pulses and normal pulses.   Pulmonary/Chest: Breath sounds normal. No respiratory distress.  Abdominal: Soft. Bowel sounds are normal. There is no  tenderness. There is no guarding.  Musculoskeletal: Normal range of motion.       Right hip: She exhibits tenderness. She exhibits normal strength and no deformity.  Small bruise area at the dorsum of the right thigh.  Lymphadenopathy:       Head (right side): No submandibular adenopathy present.       Head (left side): No submandibular adenopathy present.    She has no cervical adenopathy.  Neurological: She is alert and oriented to person, place, and time. She has normal strength. No cranial nerve deficit or sensory deficit.  Skin: Skin is warm and dry.  Psychiatric: She has a normal mood and affect. Her speech is normal.    ED Course  Procedures (including critical care time) Labs Review Labs Reviewed - No data to display  Imaging Review No results found.   EKG Interpretation None      MDM Patient fell down 2 steps on yesterday and injured the right hip and thigh area. There is no shortening of the lower extremities. There is good range of motion of the right hip, however with some discomfort. I suspect the patient has a bruise and strain of the hip area.  The patient will be treated with Robaxin and Tylenol codeine. The patient is asked to rest her hip is much as possible. She will be referred to orthopedics if not improving.    Final diagnoses:  None    **I have reviewed nursing notes, vital signs, and all appropriate lab and imaging results for this patient.Lenox Ahr, PA-C 07/30/13 2144

## 2013-07-30 NOTE — Discharge Instructions (Signed)
Please history hip is much as possible. Please use ibuprofen 3 times daily with a meal. Please use Robaxin and Tylenol codeine for pain and spasm. These medicines may cause drowsiness, please use with caution. Please see Dr. Aline Brochure for additional evaluation if not improving.

## 2013-08-04 ENCOUNTER — Emergency Department (HOSPITAL_COMMUNITY)
Admission: EM | Admit: 2013-08-04 | Discharge: 2013-08-04 | Disposition: A | Discharge status: Home / Self Care | Payer: PRIVATE HEALTH INSURANCE | Attending: Emergency Medicine | Admitting: Emergency Medicine

## 2013-08-04 ENCOUNTER — Encounter (HOSPITAL_COMMUNITY): Payer: Self-pay | Admitting: Emergency Medicine

## 2013-08-04 DIAGNOSIS — Y929 Unspecified place or not applicable: Secondary | ICD-10-CM | POA: Diagnosis not present

## 2013-08-04 DIAGNOSIS — Y939 Activity, unspecified: Secondary | ICD-10-CM | POA: Diagnosis not present

## 2013-08-04 DIAGNOSIS — Z9889 Other specified postprocedural states: Secondary | ICD-10-CM | POA: Insufficient documentation

## 2013-08-04 DIAGNOSIS — IMO0002 Reserved for concepts with insufficient information to code with codable children: Secondary | ICD-10-CM | POA: Diagnosis not present

## 2013-08-04 DIAGNOSIS — Z885 Allergy status to narcotic agent status: Secondary | ICD-10-CM | POA: Insufficient documentation

## 2013-08-04 DIAGNOSIS — K625 Hemorrhage of anus and rectum: Secondary | ICD-10-CM | POA: Insufficient documentation

## 2013-08-04 DIAGNOSIS — T63461A Toxic effect of venom of wasps, accidental (unintentional), initial encounter: Secondary | ICD-10-CM | POA: Diagnosis not present

## 2013-08-04 DIAGNOSIS — T6391XA Toxic effect of contact with unspecified venomous animal, accidental (unintentional), initial encounter: Secondary | ICD-10-CM | POA: Diagnosis present

## 2013-08-04 DIAGNOSIS — T63441A Toxic effect of venom of bees, accidental (unintentional), initial encounter: Secondary | ICD-10-CM

## 2013-08-04 DIAGNOSIS — I10 Essential (primary) hypertension: Secondary | ICD-10-CM | POA: Insufficient documentation

## 2013-08-04 DIAGNOSIS — F17200 Nicotine dependence, unspecified, uncomplicated: Secondary | ICD-10-CM | POA: Insufficient documentation

## 2013-08-04 MED ORDER — DIPHENHYDRAMINE HCL 25 MG PO TABS: 25.0000 mg | ORAL_TABLET | Freq: Four times a day (QID) | ORAL | Status: DC

## 2013-08-04 MED ORDER — PREDNISONE 50 MG PO TABS
60.0000 mg | ORAL_TABLET | Freq: Once | ORAL | Status: AC
  Administered 2013-08-04: 60 mg via ORAL
  Filled 2013-08-04 (×2): qty 1

## 2013-08-04 MED ORDER — PREDNISONE 20 MG PO TABS: 60.0000 mg | ORAL_TABLET | Freq: Every day | ORAL | Status: DC

## 2013-08-04 MED ORDER — DIPHENHYDRAMINE HCL 50 MG/ML IJ SOLN
50.0000 mg | Freq: Once | INTRAMUSCULAR | Status: AC
  Administered 2013-08-04: 50 mg via INTRAMUSCULAR
  Filled 2013-08-04: qty 1

## 2013-08-04 NOTE — ED Notes (Signed)
Bee sting to rt calf , sl redness, swelling present, No resp distress. Says she usually has a lot of itching. But no other sx.

## 2013-08-04 NOTE — Discharge Instructions (Signed)

## 2013-08-04 NOTE — ED Notes (Signed)
Pt reports was stung by a bee on the RLE a few minutes ago. nad noted. Mild redness and swelling noted to back of right calf. Airway patent.

## 2013-08-04 NOTE — ED Provider Notes (Signed)
CSN: [id number]     Arrival date & time 08/04/13  1456 History   First MD Initiated Contact with Patient 08/04/13 1514     Chief Complaint  Patient presents with  . Insect Bite     (Consider location/radiation/quality/duration/timing/severity/associated sxs/prior Treatment) The history is provided by the patient.    Autumn [name] is a 48 y.o. female presenting for treatment of a bee sting which occurred on her posterior right calf a few minutes prior to arrival.  She reports a history of bee sting reaction which includes all over body itching which developed into hives at sites that she scratches, but does not include swelling or shortness of breath.  She denies either current or history of shortness of breath, facial, lip, and time or throat swelling with bee stings.  She has had no medications prior to arrival for this exposure.  She has been applying ice to the sting site since arrival here with some improvement in pain.  She also notes that she is allergic to epinephrine stating she had a dose of this years ago and it causes increased itching and hives.       Past Medical History  Diagnosis Date  . Rectal bleeding   . H/O colonoscopy   . Hypertension    Past Surgical History  Procedure Laterality Date  . Cesarean section    . Breast lumpectomy    . Dental surgery    . Tubal ligation     Family History  Problem Relation Age of Onset  . Heart disease     History  Substance Use Topics  . Smoking status: Current Every Day Smoker -- 0.50 packs/day    Types: Cigarettes  . Smokeless tobacco: Not on file  . Alcohol Use: No   OB History   Grav Para Term Preterm Abortions TAB SAB Ect Mult Living                 Review of Systems  Constitutional: Negative for fever and chills.  Respiratory: Negative for shortness of breath, wheezing and stridor.   Cardiovascular: Negative for chest pain.  Skin: Positive for wound. Negative for color change and rash.  Neurological:  Negative for numbness.      Allergies  Bee venom and Tramadol  Home Medications   Prior to Admission medications   Medication Sig Start Date End Date Taking? Authorizing Provider  acetaminophen-codeine (TYLENOL #3) 300-30 MG per tablet Take 1-2 tablets by mouth every 6 (six) hours as needed for moderate pain. 07/30/13  Yes Lenox Ahr, PA-C  baclofen (LIORESAL) 10 MG tablet Take 10 mg by mouth 3 (three) times daily. 07/31/13  Yes Historical Provider, MD  methocarbamol (ROBAXIN) 500 MG tablet Take 1 tablet (500 mg total) by mouth 3 (three) times daily. 07/30/13  Yes Lenox Ahr, PA-C  calcium carbonate (TUMS - DOSED IN MG ELEMENTAL CALCIUM) 500 MG chewable tablet Chew 1 tablet by mouth daily as needed for heartburn.    Historical Provider, MD  diphenhydrAMINE (BENADRYL) 25 MG tablet Take 1 tablet (25 mg total) by mouth every 6 (six) hours. 08/04/13   Evalee Jefferson, PA-C  ibuprofen (ADVIL,MOTRIN) 400 MG tablet Take 400 mg by mouth every 6 (six) hours as needed. pain 07/30/13   Historical Provider, MD  predniSONE (DELTASONE) 20 MG tablet Take 3 tablets (60 mg total) by mouth daily. 08/04/13   Evalee Jefferson, PA-C   BP 129/78  Pulse 105  Temp(Src) 99.1 F (37.3 C) (Oral)  Resp 20  Ht 5\' 4"  (1.626 m)  Wt 210 lb (95.255 kg)  BMI 36.03 kg/m2  SpO2 99%  LMP 07/12/2013 Physical Exam  Nursing note and vitals reviewed. Constitutional: She appears well-developed and well-nourished.  HENT:  Head: Normocephalic and atraumatic.  Mouth/Throat: Oropharynx is clear and moist.  No facial, tongue, mouth edema.   Eyes: Conjunctivae are normal.  Neck: Normal range of motion.  Cardiovascular: Normal rate, regular rhythm, normal heart sounds and intact distal pulses.   Pulmonary/Chest: Effort normal and breath sounds normal. No stridor. She has no wheezes. She has no rales.  Abdominal: Soft. Bowel sounds are normal. There is no tenderness.  Musculoskeletal: Normal range of motion.  Neurological: She is  alert.  Skin: Skin is warm and dry.  Slightly raised insect sting right posterior calf with surrounding induration.  No hives or rash appreciated at time of exam.  Psychiatric: She has a normal mood and affect.    ED Course  Procedures (including critical care time) Labs Review Labs Reviewed - No data to display  Imaging Review No results found.   EKG Interpretation None      MDM   Final diagnoses:  Bee sting, accidental or unintentional, initial encounter    Pt was given prednisone, benadryl.  Observed in dept for 2 hours with no defervescense.  Localized sting site actually looks improved after ice application.  No evidence of anaphylactic reaction. Prescribed additional prednisone, benadryl.  Advised return here for any change in sx, cough, sob, wheeze, swelling.  Pt understands and agrees with plan.    Evalee Jefferson, PA-C 08/05/13 [place], PA-C 08/05/13 [phone]

## 2013-08-05 NOTE — ED Provider Notes (Signed)
Medical screening examination/treatment/procedure(s) were performed by non-physician practitioner and as supervising physician I was immediately available for consultation/collaboration.   EKG Interpretation None        Maudry Diego, MD 08/05/13 2134

## 2013-08-07 ENCOUNTER — Emergency Department (HOSPITAL_COMMUNITY)
Admission: EM | Admit: 2013-08-07 | Discharge: 2013-08-07 | Disposition: A | Discharge status: Home / Self Care | Payer: PRIVATE HEALTH INSURANCE | Attending: Emergency Medicine | Admitting: Emergency Medicine

## 2013-08-07 ENCOUNTER — Encounter (HOSPITAL_COMMUNITY): Payer: Self-pay | Admitting: Emergency Medicine

## 2013-08-07 DIAGNOSIS — Y9389 Activity, other specified: Secondary | ICD-10-CM | POA: Diagnosis not present

## 2013-08-07 DIAGNOSIS — Z9889 Other specified postprocedural states: Secondary | ICD-10-CM | POA: Insufficient documentation

## 2013-08-07 DIAGNOSIS — Z8719 Personal history of other diseases of the digestive system: Secondary | ICD-10-CM | POA: Diagnosis not present

## 2013-08-07 DIAGNOSIS — T63444A Toxic effect of venom of bees, undetermined, initial encounter: Secondary | ICD-10-CM

## 2013-08-07 DIAGNOSIS — Z79899 Other long term (current) drug therapy: Secondary | ICD-10-CM | POA: Insufficient documentation

## 2013-08-07 DIAGNOSIS — T63461A Toxic effect of venom of wasps, accidental (unintentional), initial encounter: Secondary | ICD-10-CM | POA: Insufficient documentation

## 2013-08-07 DIAGNOSIS — Y9289 Other specified places as the place of occurrence of the external cause: Secondary | ICD-10-CM | POA: Diagnosis not present

## 2013-08-07 DIAGNOSIS — I10 Essential (primary) hypertension: Secondary | ICD-10-CM | POA: Diagnosis not present

## 2013-08-07 DIAGNOSIS — IMO0002 Reserved for concepts with insufficient information to code with codable children: Secondary | ICD-10-CM | POA: Insufficient documentation

## 2013-08-07 DIAGNOSIS — F17200 Nicotine dependence, unspecified, uncomplicated: Secondary | ICD-10-CM | POA: Diagnosis not present

## 2013-08-07 DIAGNOSIS — T6391XA Toxic effect of contact with unspecified venomous animal, accidental (unintentional), initial encounter: Secondary | ICD-10-CM | POA: Insufficient documentation

## 2013-08-07 MED ORDER — DIPHENHYDRAMINE HCL 25 MG PO CAPS
50.0000 mg | ORAL_CAPSULE | Freq: Once | ORAL | Status: AC
  Administered 2013-08-07: 50 mg via ORAL
  Filled 2013-08-07: qty 2

## 2013-08-07 MED ORDER — IBUPROFEN 400 MG PO TABS
400.0000 mg | ORAL_TABLET | Freq: Once | ORAL | Status: AC
  Administered 2013-08-07: 400 mg via ORAL
  Filled 2013-08-07: qty 1

## 2013-08-07 NOTE — Discharge Instructions (Signed)
Use Benadryl, 25-50 mg, 4 times a day, for itching or swelling. Take ibuprofen, 400 mg, 3 times a day, for pain.    Bee, Wasp, or Hornet Sting Your caregiver has diagnosed you as having an insect sting. An insect sting appears as a red lump in the skin that sometimes has a tiny hole in the center, or it may have a stinger in the center of the wound. The most common stings are from wasps, hornets and bees. Individuals have different reactions to insect stings.  A normal reaction may cause pain, swelling, and redness around the sting site.  A localized allergic reaction may cause swelling and redness that extends beyond the sting site.  A large local reaction may continue to develop over the next 12 to 36 hours.  On occasion, the reactions can be severe (anaphylactic reaction). An anaphylactic reaction may cause wheezing; difficulty breathing; chest pain; fainting; raised, itchy, red patches on the skin; a sick feeling to your stomach (nausea); vomiting; cramping; or diarrhea. If you have had an anaphylactic reaction to an insect sting in the past, you are more likely to have one again. HOME CARE INSTRUCTIONS   With bee stings, a small sac of poison is left in the wound. Brushing across this with something such as a credit card, or anything similar, will help remove this and decrease the amount of the reaction. This same procedure will not help a wasp sting as they do not leave behind a stinger and poison sac.  Apply a cold compress for 10 to 20 minutes every hour for 1 to 2 days, depending on severity, to reduce swelling and itching.  To lessen pain, a paste made of water and baking soda may be rubbed on the bite or sting and left on for 5 minutes.  To relieve itching and swelling, you may use take medication or apply medicated creams or lotions as directed.  Only take over-the-counter or prescription medicines for pain, discomfort, or fever as directed by your caregiver.  Wash the sting  site daily with soap and water. Apply antibiotic ointment on the sting site as directed.  If you suffered a severe reaction:  If you did not require hospitalization, an adult will need to stay with you for 24 hours in case the symptoms return.  You may need to wear a medical bracelet or necklace stating the allergy.  You and your family need to learn when and how to use an anaphylaxis kit or epinephrine injection.  If you have had a severe reaction before, always carry your anaphylaxis kit with you. SEEK MEDICAL CARE IF:   None of the above helps within 2 to 3 days.  The area becomes red, warm, tender, and swollen beyond the area of the bite or sting.  You have an oral temperature above 102 F (38.9 C). SEEK IMMEDIATE MEDICAL CARE IF:  You have symptoms of an allergic reaction which are:  Wheezing.  Difficulty breathing.  Chest pain.  Lightheadedness or fainting.  Itchy, raised, red patches on the skin.  Nausea, vomiting, cramping or diarrhea. ANY OF THESE SYMPTOMS MAY REPRESENT A SERIOUS PROBLEM THAT IS AN EMERGENCY. Do not wait to see if the symptoms will go away. Get medical help right away. Call your local emergency services (911 in [place] DO NOT drive yourself to the hospital. MAKE SURE YOU:   Understand these instructions.  Will watch your condition.  Will get help right away if you are not doing well or get  worse. Document Released: 01/12/2005 Document Revised: 04/06/2011 Document Reviewed: 06/29/2009 [facility] Patient Information 2015 [place], [place]. This information is not intended to replace advice given to you by your health care provider. Make sure you discuss any questions you have with your health care provider.

## 2013-08-07 NOTE — ED Notes (Signed)
Bee sting to neck and rt breast 15 mins prior to arrival. C/o pain to neck. No resp symptoms. Speaking complete sentences without difficulty.

## 2013-08-07 NOTE — ED Provider Notes (Signed)
CSN: [id number]     Arrival date & time 08/07/13  1029 History  This chart was scribed for Richarda Blade, MD by Elby Beck, ED Scribe. This patient was seen in room APA10/APA10 and the patient's care was started at 11:13 AM.   Chief Complaint  Patient presents with  . Insect Bite    The history is provided by the patient. No language interpreter was used.    HPI Comments: Autumn [name] is a 48 y.o. female who presents to the Emergency Department complaining of bee stings to her right back and right chest area that occurred earlier today. She states that her right trapezius muscle has been "tense" since the bee sting occurred earlier today. She states that she is allergic to bee venom. She also states that she suffered a separate bee sting 2 days ago to her right leg. She states that she has never had anaphylactic reactions in response to bee stings. She denies any dyspnea or dysphagia.    Past Medical History  Diagnosis Date  . Rectal bleeding   . H/O colonoscopy   . Hypertension    Past Surgical History  Procedure Laterality Date  . Cesarean section    . Breast lumpectomy    . Dental surgery    . Tubal ligation     Family History  Problem Relation Age of Onset  . Heart disease     History  Substance Use Topics  . Smoking status: Current Every Day Smoker -- 0.50 packs/day    Types: Cigarettes  . Smokeless tobacco: Not on file  . Alcohol Use: No   OB History   Grav Para Term Preterm Abortions TAB SAB Ect Mult Living                 Review of Systems  HENT: Negative for trouble swallowing.   Respiratory: Negative for shortness of breath.   All other systems reviewed and are negative.     Allergies  Bee venom and Tramadol  Home Medications   Prior to Admission medications   Medication Sig Start Date End Date Taking? Authorizing Provider  acetaminophen-codeine (TYLENOL #3) 300-30 MG per tablet Take 1-2 tablets by mouth every 6 (six) hours as needed  for moderate pain. 07/30/13  Yes Lenox Ahr, PA-C  baclofen (LIORESAL) 10 MG tablet Take 10 mg by mouth 3 (three) times daily. 07/31/13  Yes Historical Provider, MD  calcium carbonate (TUMS - DOSED IN MG ELEMENTAL CALCIUM) 500 MG chewable tablet Chew 1 tablet by mouth daily as needed for heartburn.   Yes Historical Provider, MD  diphenhydrAMINE (BENADRYL) 25 MG tablet Take 1 tablet (25 mg total) by mouth every 6 (six) hours. 08/04/13  Yes Evalee Jefferson, PA-C  methocarbamol (ROBAXIN) 500 MG tablet Take 1 tablet (500 mg total) by mouth 3 (three) times daily. 07/30/13  Yes Lenox Ahr, PA-C  predniSONE (DELTASONE) 20 MG tablet Take 3 tablets (60 mg total) by mouth daily. 08/04/13  Yes Evalee Jefferson, PA-C   Triage Vitals: BP 138/98  Pulse 82  Temp(Src) 98 F (36.7 C) (Oral)  Resp 16  SpO2 100%  LMP 07/12/2013  Physical Exam  Nursing note and vitals reviewed. Constitutional: She is oriented to person, place, and time. She appears well-developed and well-nourished.  HENT:  Head: Normocephalic and atraumatic.  No angioedema  Eyes: Conjunctivae and EOM are normal. Pupils are equal, round, and reactive to light.  Neck: Normal range of motion and phonation normal. Neck supple.  Cardiovascular: Normal rate, regular rhythm and intact distal pulses.   Pulmonary/Chest: Effort normal and breath sounds normal. No respiratory distress. She has no wheezes. She exhibits no tenderness.  Abdominal: Soft. She exhibits no distension. There is no tenderness. There is no guarding.  Musculoskeletal: Normal range of motion.  Neurological: She is alert and oriented to person, place, and time. She exhibits normal muscle tone.  Skin: Skin is warm and dry.  2 small welts to right upper back and right breast  Psychiatric: She has a normal mood and affect. Her behavior is normal. Judgment and thought content normal.    ED Course  Procedures (including critical care time)  DIAGNOSTIC STUDIES: Oxygen Saturation is 100%  on RA, normal by my interpretation.    COORDINATION OF CARE: 11:18 AM- Will order Benadryl and Motrin. Pt advised of plan for treatment and pt agrees.  Labs Review Labs Reviewed - No data to display  Imaging Review No results found.   EKG Interpretation None      MDM   Final diagnoses:  Bee sting, undetermined intent, initial encounter    Bee stings. No evidence for serious allergic reaction. No evidence for anaphylaxis  Nursing Notes Reviewed/ Care Coordinated Applicable Imaging Reviewed Interpretation of Laboratory Data incorporated into ED treatment  The patient appears reasonably screened and/or stabilized for discharge and I doubt any other medical condition or other [facility] requiring further screening, evaluation, or treatment in the ED at this time prior to discharge.  Plan: Home Medications- Benadryl, ibuprofen; Home Treatments- cool compresses; return here if the recommended treatment, does not improve the symptoms; Recommended follow up- PCP, when necessary    I personally performed the services described in this documentation, which was scribed in my presence. The recorded information has been reviewed and is accurate.     Richarda Blade, MD 08/07/13 [phone]

## 2013-08-12 ENCOUNTER — Emergency Department (HOSPITAL_COMMUNITY)
Admission: EM | Admit: 2013-08-12 | Discharge: 2013-08-13 | Disposition: A | Discharge status: Home / Self Care | Payer: PRIVATE HEALTH INSURANCE | Attending: Emergency Medicine | Admitting: Emergency Medicine

## 2013-08-12 ENCOUNTER — Emergency Department (HOSPITAL_COMMUNITY): Payer: PRIVATE HEALTH INSURANCE

## 2013-08-12 ENCOUNTER — Encounter (HOSPITAL_COMMUNITY): Payer: Self-pay | Admitting: Emergency Medicine

## 2013-08-12 DIAGNOSIS — R0602 Shortness of breath: Secondary | ICD-10-CM | POA: Diagnosis not present

## 2013-08-12 DIAGNOSIS — Z9889 Other specified postprocedural states: Secondary | ICD-10-CM | POA: Insufficient documentation

## 2013-08-12 DIAGNOSIS — I10 Essential (primary) hypertension: Secondary | ICD-10-CM | POA: Diagnosis not present

## 2013-08-12 DIAGNOSIS — Z79899 Other long term (current) drug therapy: Secondary | ICD-10-CM | POA: Diagnosis not present

## 2013-08-12 DIAGNOSIS — R002 Palpitations: Secondary | ICD-10-CM | POA: Diagnosis present

## 2013-08-12 DIAGNOSIS — Z8719 Personal history of other diseases of the digestive system: Secondary | ICD-10-CM | POA: Insufficient documentation

## 2013-08-12 DIAGNOSIS — IMO0002 Reserved for concepts with insufficient information to code with codable children: Secondary | ICD-10-CM | POA: Diagnosis not present

## 2013-08-12 DIAGNOSIS — F17200 Nicotine dependence, unspecified, uncomplicated: Secondary | ICD-10-CM | POA: Diagnosis not present

## 2013-08-12 LAB — BASIC METABOLIC PANEL
Anion gap: 11 (ref 5–15)
BUN: 14 mg/dL (ref 6–23)
CALCIUM: 9.3 mg/dL (ref 8.4–10.5)
CO2: 25 mEq/L (ref 19–32)
CREATININE: 0.94 mg/dL (ref 0.50–1.10)
Chloride: 103 mEq/L (ref 96–112)
GFR calc Af Amer: 84 mL/min — ABNORMAL LOW (ref >90)
GFR, EST NON AFRICAN AMERICAN: 72 mL/min — AB (ref >90)
GLUCOSE: 114 mg/dL — AB (ref 70–99)
Potassium: 4 mEq/L (ref 3.7–5.3)
SODIUM: 139 meq/L (ref 137–147)

## 2013-08-12 LAB — CBC WITH DIFFERENTIAL/PLATELET
BAND NEUTROPHILS: 0 % (ref 0–10)
BLASTS: 0 %
Basophils Absolute: 0.1 10*3/uL (ref 0.0–0.1)
Basophils Relative: 1 % (ref 0–1)
Eosinophils Absolute: 0.6 10*3/uL (ref 0.0–0.7)
Eosinophils Relative: 5 % (ref 0–5)
HEMATOCRIT: 35.2 % — AB (ref 36.0–46.0)
Hemoglobin: 11.6 g/dL — ABNORMAL LOW (ref 12.0–15.0)
LYMPHS ABS: 4.4 10*3/uL — AB (ref 0.7–4.0)
LYMPHS PCT: 35 % (ref 12–46)
MCH: 23.2 pg — ABNORMAL LOW (ref 26.0–34.0)
MCHC: 33 g/dL (ref 30.0–36.0)
MCV: 70.4 fL — ABNORMAL LOW (ref 78.0–100.0)
MONO ABS: 0.4 10*3/uL (ref 0.1–1.0)
MONOS PCT: 3 % (ref 3–12)
Metamyelocytes Relative: 0 %
Myelocytes: 0 %
NEUTROS ABS: 7 10*3/uL (ref 1.7–7.7)
NEUTROS PCT: 56 % (ref 43–77)
PROMYELOCYTES ABS: 0 %
Platelets: 421 10*3/uL — ABNORMAL HIGH (ref 150–400)
RBC: 5 MIL/uL (ref 3.87–5.11)
RDW: 14.5 % (ref 11.5–15.5)
WBC: 12.5 10*3/uL — AB (ref 4.0–10.5)
nRBC: 0 /100 WBC

## 2013-08-12 LAB — TROPONIN I

## 2013-08-12 MED ORDER — IBUPROFEN 400 MG PO TABS
400.0000 mg | ORAL_TABLET | Freq: Once | ORAL | Status: AC
  Administered 2013-08-12: 400 mg via ORAL
  Filled 2013-08-12: qty 1

## 2013-08-12 NOTE — Discharge Instructions (Signed)

## 2013-08-12 NOTE — ED Provider Notes (Signed)
CSN: [id number]     Arrival date & time 08/12/13  2105 History   None    This chart was scribed for Autumn [name], * by Forrestine Him, ED Scribe. This patient was seen in room APA05/APA05 and the patient's care was started 9:34 PM.   No chief complaint on file.  HPI  HPI Comments: Autumn [name] is a 48 y.o. female with a PMHx of HTN who presents to the Emergency Department complaining of intermittent, moderate heart palpitations onset earlier today while sitting at dialysis treatment with a relative. She also reports mild SOB associated with "fluttering". She denies a history of previous similar episodes. At this time she denies any fever, chills, CP, or abdominal pain. Pt with known allergies to Tramadol. She has no other pertinent past medical history. No other concerns this visit.   Past Medical History  Diagnosis Date  . Rectal bleeding   . H/O colonoscopy   . Hypertension    Past Surgical History  Procedure Laterality Date  . Cesarean section    . Breast lumpectomy    . Dental surgery    . Tubal ligation     Family History  Problem Relation Age of Onset  . Heart disease     History  Substance Use Topics  . Smoking status: Current Every Day Smoker -- 0.50 packs/day    Types: Cigarettes  . Smokeless tobacco: Not on file  . Alcohol Use: No   OB History   Grav Para Term Preterm Abortions TAB SAB Ect Mult Living                 Review of Systems  Constitutional: Negative for fever and chills.  Respiratory: Positive for shortness of breath.   Cardiovascular: Positive for palpitations.  Skin: Negative for rash.  Psychiatric/Behavioral: Negative for confusion.      Allergies  Bee venom and Tramadol  Home Medications   Prior to Admission medications   Medication Sig Start Date End Date Taking? Authorizing Provider  acetaminophen-codeine (TYLENOL #3) 300-30 MG per tablet Take 1-2 tablets by mouth every 6 (six) hours as needed for moderate pain.  07/30/13   Lenox Ahr, PA-C  baclofen (LIORESAL) 10 MG tablet Take 10 mg by mouth 3 (three) times daily. 07/31/13   Historical Provider, MD  calcium carbonate (TUMS - DOSED IN MG ELEMENTAL CALCIUM) 500 MG chewable tablet Chew 1 tablet by mouth daily as needed for heartburn.    Historical Provider, MD  diphenhydrAMINE (BENADRYL) 25 MG tablet Take 1 tablet (25 mg total) by mouth every 6 (six) hours. 08/04/13   Evalee Jefferson, PA-C  methocarbamol (ROBAXIN) 500 MG tablet Take 1 tablet (500 mg total) by mouth 3 (three) times daily. 07/30/13   Lenox Ahr, PA-C  predniSONE (DELTASONE) 20 MG tablet Take 3 tablets (60 mg total) by mouth daily. 08/04/13   Evalee Jefferson, PA-C   Triage Vitals: BP 152/85  Pulse 108  Temp(Src) 99.5 F (37.5 C) (Oral)  Resp 18  Ht 5\' 4"  (1.626 m)  Wt 220 lb (99.791 kg)  BMI 37.74 kg/m2  SpO2 97%  LMP 07/12/2013   Physical Exam  Constitutional: She is oriented to person, place, and time. She appears well-developed and well-nourished. No distress.  HENT:  Head: Normocephalic and atraumatic.  Right Ear: Hearing normal.  Left Ear: Hearing normal.  Nose: Nose normal.  Mouth/Throat: Oropharynx is clear and moist and mucous membranes are normal.  Eyes: Conjunctivae and EOM are normal. Pupils  are equal, round, and reactive to light.  Neck: Normal range of motion. Neck supple.  Cardiovascular: Regular rhythm, S1 normal and S2 normal.  Exam reveals no gallop and no friction rub.   No murmur heard. Pulmonary/Chest: Effort normal and breath sounds normal. No respiratory distress. She exhibits no tenderness.  Abdominal: Soft. Normal appearance and bowel sounds are normal. There is no hepatosplenomegaly. There is no tenderness. There is no rebound, no guarding, no tenderness at McBurney's point and negative Murphy's sign. No hernia.  Musculoskeletal: Normal range of motion.  Neurological: She is alert and oriented to person, place, and time. She has normal strength. No cranial nerve  deficit or sensory deficit. Coordination normal. GCS eye subscore is 4. GCS verbal subscore is 5. GCS motor subscore is 6.  Skin: Skin is warm, dry and intact. No rash noted. No cyanosis.  Psychiatric: She has a normal mood and affect. Her speech is normal and behavior is normal. Thought content normal.    ED Course  Procedures (including critical care time)  DIAGNOSTIC STUDIES: Oxygen Saturation is 97% on RA, Normal by my interpretation.    COORDINATION OF CARE: 9:33 PM- Will give ibuprofen. Will order DG chest 2 view, CBC, BMP, EKG, and Troponin I. Discussed treatment plan with pt at bedside and pt agreed to plan.     Labs Review Labs Reviewed  CBC WITH DIFFERENTIAL - Abnormal; Notable for the following:    WBC 12.5 (*)    Hemoglobin 11.6 (*)    HCT 35.2 (*)    MCV 70.4 (*)    MCH 23.2 (*)    Platelets 421 (*)    Lymphs Abs 4.4 (*)    All other components within normal limits  BASIC METABOLIC PANEL - Abnormal; Notable for the following:    Glucose, Bld 114 (*)    GFR calc non Af Amer 72 (*)    GFR calc Af Amer 84 (*)    All other components within normal limits  TROPONIN I    Imaging Review Dg Chest 2 View  08/12/2013   CLINICAL DATA:  Shortness of breath  EXAM: CHEST  2 VIEW  COMPARISON:  10/06/2012  FINDINGS: The heart size and mediastinal contours are within normal limits. Both lungs are clear. The visualized skeletal structures are unremarkable.  IMPRESSION: No active cardiopulmonary disease.   Electronically Signed   By: Kathreen Devoid   On: 08/12/2013 22:32     EKG Interpretation None      Date: 08/12/2013  Rate: 103  Rhythm: sinus tachycardia  QRS Axis: normal  Intervals: normal  ST/T Wave abnormalities: normal  Conduction Disutrbances:none  Narrative Interpretation:   Old EKG Reviewed: none available     MDM   Final diagnoses:  None  Palpitations    Patient presents to the ER for evaluation of a fluttering sensation in her chest. Symptoms have  been occurring intermittently today. Patient does report that she had some emotional upset prior to onset of the symptoms, but she feels calm now and a fluttering continues. Further occurs intermittently and lasts for a few seconds. She feels slightly short of breath when the flare is there, but no continuous shortness of breath. Patient seemed very anxious upon arrival, improved without any intervention. Heart rate is now in the 80s. Cardiac workup is negative. I recommend that the patient follows up with private care physician, [facility], in the office. If she continues to have palpitations will require outpatient monitoring. She does not require  any inpatient treatment at this time.  I personally performed the services described in this documentation, which was scribed in my presence. The recorded information has been reviewed and is accurate.    Autumn Greek, MD 08/12/13 [phone]

## 2013-08-12 NOTE — ED Notes (Signed)
Pt states she got upset with her daughter and now she feels like her heart is fluttering and has been all day. Pt also c/o headache.

## 2013-11-17 ENCOUNTER — Encounter (HOSPITAL_COMMUNITY): Payer: Self-pay | Admitting: Emergency Medicine

## 2013-11-17 ENCOUNTER — Emergency Department (HOSPITAL_COMMUNITY)
Admission: EM | Admit: 2013-11-17 | Discharge: 2013-11-17 | Disposition: A | Discharge status: Home / Self Care | Payer: PRIVATE HEALTH INSURANCE | Attending: Emergency Medicine | Admitting: Emergency Medicine

## 2013-11-17 ENCOUNTER — Emergency Department (HOSPITAL_COMMUNITY): Payer: PRIVATE HEALTH INSURANCE

## 2013-11-17 DIAGNOSIS — R059 Cough, unspecified: Secondary | ICD-10-CM

## 2013-11-17 DIAGNOSIS — R0789 Other chest pain: Secondary | ICD-10-CM | POA: Diagnosis not present

## 2013-11-17 DIAGNOSIS — J069 Acute upper respiratory infection, unspecified: Secondary | ICD-10-CM | POA: Diagnosis not present

## 2013-11-17 DIAGNOSIS — Z720 Tobacco use: Secondary | ICD-10-CM | POA: Diagnosis not present

## 2013-11-17 DIAGNOSIS — Z8719 Personal history of other diseases of the digestive system: Secondary | ICD-10-CM | POA: Insufficient documentation

## 2013-11-17 DIAGNOSIS — R05 Cough: Secondary | ICD-10-CM

## 2013-11-17 DIAGNOSIS — I10 Essential (primary) hypertension: Secondary | ICD-10-CM | POA: Insufficient documentation

## 2013-11-17 DIAGNOSIS — Z7952 Long term (current) use of systemic steroids: Secondary | ICD-10-CM | POA: Diagnosis not present

## 2013-11-17 DIAGNOSIS — Z79899 Other long term (current) drug therapy: Secondary | ICD-10-CM | POA: Insufficient documentation

## 2013-11-17 DIAGNOSIS — R058 Other specified cough: Secondary | ICD-10-CM

## 2013-11-17 MED ORDER — HYDROCOD POLST-CHLORPHEN POLST 10-8 MG/5ML PO LQCR
5.0000 mL | Freq: Once | ORAL | Status: AC
Start: 2013-11-17 — End: 2013-11-17
  Administered 2013-11-17: 5 mL via ORAL
  Filled 2013-11-17: qty 5

## 2013-11-17 MED ORDER — ALBUTEROL SULFATE HFA 108 (90 BASE) MCG/ACT IN AERS
2.0000 | INHALATION_SPRAY | Freq: Once | RESPIRATORY_TRACT | Status: AC
  Administered 2013-11-17: 2 via RESPIRATORY_TRACT
  Filled 2013-11-17: qty 6.7

## 2013-11-17 MED ORDER — PREDNISONE 10 MG PO TABS: ORAL_TABLET | ORAL | Status: DC

## 2013-11-17 MED ORDER — GUAIFENESIN-CODEINE 100-10 MG/5ML PO SYRP: 10.0000 mL | ORAL_SOLUTION | Freq: Three times a day (TID) | ORAL | Status: DC | PRN

## 2013-11-17 MED ORDER — IBUPROFEN 800 MG PO TABS
800.0000 mg | ORAL_TABLET | Freq: Once | ORAL | Status: AC
  Administered 2013-11-17: 800 mg via ORAL
  Filled 2013-11-17: qty 1

## 2013-11-17 NOTE — Discharge Instructions (Signed)
Cough, Adult  A cough is a reflex. It helps you clear your throat and airways. A cough can help heal your body. A cough can last 2 or 3 weeks (acute) or may last more than 8 weeks (chronic). Some common causes of a cough can include an infection, allergy, or a cold. HOME CARE  Only take medicine as told by your doctor.  If given, take your medicines (antibiotics) as told. Finish them even if you start to feel better.  Use a cold steam vaporizer or humidifier in your home. This can help loosen thick spit (secretions).  Sleep so you are almost sitting up (semi-upright). Use pillows to do this. This helps reduce coughing.  Rest as needed.  Stop smoking if you smoke. GET HELP RIGHT AWAY IF:  You have yellowish-white fluid (pus) in your thick spit.  Your cough gets worse.  Your medicine does not reduce coughing, and you are losing sleep.  You cough up blood.  You have trouble breathing.  Your pain gets worse and medicine does not help.  You have a fever. MAKE SURE YOU:   Understand these instructions.  Will watch your condition.  Will get help right away if you are not doing well or get worse. Document Released: 09/25/2010 Document Revised: 05/29/2013 Document Reviewed: 09/25/2010 [facility] Patient Information 2015 [place], [place]. This information is not intended to replace advice given to you by your health care provider. Make sure you discuss any questions you have with your health care provider.  Viral Infections A virus is a type of germ. Viruses can cause:  Minor sore throats.  Aches and pains.  Headaches.  Runny nose.  Rashes.  Watery eyes.  Tiredness.  Coughs.  Loss of appetite.  Feeling sick to your stomach (nausea).  Throwing up (vomiting).  Watery poop (diarrhea). HOME CARE   Only take medicines as told by your doctor.  Drink enough water and fluids to keep your pee (urine) clear or pale yellow. Sports drinks are a good choice.  Get plenty  of rest and eat healthy. Soups and broths with crackers or rice are fine. GET HELP RIGHT AWAY IF:   You have a very bad headache.  You have shortness of breath.  You have chest pain or neck pain.  You have an unusual rash.  You cannot stop throwing up.  You have watery poop that does not stop.  You cannot keep fluids down.  You or your child has a temperature by mouth above 102 F (38.9 C), not controlled by medicine.  Your baby is older than 3 months with a rectal temperature of 102 F (38.9 C) or higher.  Your baby is 103 months old or younger with a rectal temperature of 100.4 F (38 C) or higher. MAKE SURE YOU:   Understand these instructions.  Will watch this condition.  Will get help right away if you are not doing well or get worse. Document Released: 12/26/2007 Document Revised: 04/06/2011 Document Reviewed: 05/20/2010 [facility] Patient Information 2015 [place], [place]. This information is not intended to replace advice given to you by your health care provider. Make sure you discuss any questions you have with your health care provider.

## 2013-11-17 NOTE — ED Notes (Signed)
Pt co cough and congestion x 1 week, non productive cough, facial pain, sore throat, pressure in chest.

## 2013-11-19 NOTE — ED Provider Notes (Signed)
CSN: [id number]     Arrival date & time 11/17/13  1058 History   First MD Initiated Contact with Patient 11/17/13 1223     Chief Complaint  Patient presents with  . Cough     (Consider location/radiation/quality/duration/timing/severity/associated sxs/prior Treatment) HPI   Autumn [name] is a 48 y.o. female who presents to the Emergency Department complaining of cough and nasal congestion for 1 week. She also complains of facial pain and pressure and sore throat. She states cough has mostly been nonproductive. She complains of tightness to her chest with excessive cough. She has used over-the-counter cold and cough medications without relief. She denies fever, shortness of breath, chest pain, neck pain or stiffness or upper back pain.   Past Medical History  Diagnosis Date  . Rectal bleeding   . H/O colonoscopy   . Hypertension    Past Surgical History  Procedure Laterality Date  . Cesarean section    . Breast lumpectomy    . Dental surgery    . Tubal ligation     Family History  Problem Relation Age of Onset  . Heart disease     History  Substance Use Topics  . Smoking status: Current Every Day Smoker -- 0.50 packs/day    Types: Cigarettes  . Smokeless tobacco: Not on file  . Alcohol Use: No   OB History   Grav Para Term Preterm Abortions TAB SAB Ect Mult Living                 Review of Systems  Constitutional: Negative for fever, chills, activity change and appetite change.  HENT: Positive for congestion, rhinorrhea, sinus pressure, sneezing and sore throat. Negative for facial swelling and trouble swallowing.   Eyes: Negative for visual disturbance.  Respiratory: Positive for cough and chest tightness. Negative for shortness of breath, wheezing and stridor.   Cardiovascular: Negative for chest pain.  Gastrointestinal: Negative for nausea, vomiting and abdominal pain.  Genitourinary: Negative for flank pain.  Musculoskeletal: Negative for neck pain and  neck stiffness.  Skin: Negative.   Neurological: Negative for dizziness, weakness, numbness and headaches.  Hematological: Negative for adenopathy.  Psychiatric/Behavioral: Negative for confusion.  All other systems reviewed and are negative.     Allergies  Bee venom and Tramadol  Home Medications   Prior to Admission medications   Medication Sig Start Date End Date Taking? Authorizing Provider  calcium carbonate (TUMS - DOSED IN MG ELEMENTAL CALCIUM) 500 MG chewable tablet Chew 1 tablet by mouth daily as needed for heartburn.    Historical Provider, MD  guaiFENesin-codeine (ROBITUSSIN AC) 100-10 MG/5ML syrup Take 10 mLs by mouth 3 (three) times daily as needed. 11/17/13   Bandy Honaker L. Lindon Kiel, PA-C  predniSONE (DELTASONE) 10 MG tablet Take 6 tablets day one, 5 tablets day two, 4 tablets day three, 3 tablets day four, 2 tablets day five, then 1 tablet day six 11/17/13   Ayomide Purdy L. Chauntel Windsor, PA-C   BP 134/92  Pulse 90  Temp(Src) 99 F (37.2 C) (Oral)  Resp 17  Ht 5\' 6"  (1.676 m)  Wt 210 lb (95.255 kg)  BMI 33.91 kg/m2  SpO2 100%  LMP 10/28/2013 Physical Exam  Nursing note and vitals reviewed. Constitutional: She is oriented to person, place, and time. She appears well-developed and well-nourished. No distress.  HENT:  Head: Normocephalic and atraumatic.  Right Ear: Tympanic membrane and ear canal normal.  Left Ear: Tympanic membrane and ear canal normal.  Nose: Mucosal edema present. Right  sinus exhibits maxillary sinus tenderness. Left sinus exhibits maxillary sinus tenderness.  Mouth/Throat: Uvula is midline and mucous membranes are normal. No oral lesions. No uvula swelling. Posterior oropharyngeal erythema present. No oropharyngeal exudate, posterior oropharyngeal edema or tonsillar abscesses.  Eyes: EOM are normal. Pupils are equal, round, and reactive to light.  Neck: Normal range of motion, full passive range of motion without pain and phonation normal. Neck supple.   Cardiovascular: Normal rate, regular rhythm, normal heart sounds and intact distal pulses.   No murmur heard. Pulmonary/Chest: Effort normal. No stridor. No respiratory distress. She has wheezes. She has no rales. She exhibits no tenderness.  Coarse lungs sounds bilaterally. Few scattered expiratory wheezes, no rales.  Abdominal: Soft. She exhibits no distension. There is no tenderness. There is no rebound.  Musculoskeletal: Normal range of motion. She exhibits no edema.  Lymphadenopathy:    She has no cervical adenopathy.  Neurological: She is alert and oriented to person, place, and time. She exhibits normal muscle tone. Coordination normal.  Skin: Skin is warm and dry.    ED Course  Procedures (including critical care time) Labs Review Labs Reviewed - No data to display  Imaging Review Dg Chest 2 View  11/17/2013   CLINICAL DATA:  Chest pain.  Cough.  EXAM: CHEST  2 VIEW  COMPARISON:  08/12/2013.  FINDINGS: Mediastinum and hilar structures are normal. No pleural effusion or pneumothorax. Heart size normal . No acute bony abnormality.  IMPRESSION: No active cardiopulmonary disease.   Electronically Signed   By: Marcello Moores  Register   On: 11/17/2013 11:54     EKG Interpretation None      MDM   Final diagnoses:  Cough  URI, acute    Patient is well appearing. Vital signs are stable. Symptoms are likely related to viral URI. No concerning symptoms for PE. Patient agrees to symptomatic treatment with Robitussin-AC and prednisone taper. Advised to encourage fluids Tylenol if needed for fever patient agrees to close follow up with her primary care physician if needed.    Twyla Dais L. Junius Faucett, PA-C 11/19/13 0825

## 2013-11-21 NOTE — ED Provider Notes (Signed)
Medical screening examination/treatment/procedure(s) were performed by non-physician practitioner and as supervising physician I was immediately available for consultation/collaboration.   EKG Interpretation None        Maudry Diego, MD 11/21/13 [phone]

## 2013-12-18 ENCOUNTER — Ambulatory Visit (HOSPITAL_COMMUNITY)
Admission: RE | Admit: 2013-12-18 | Discharge: 2013-12-18 | Disposition: A | Discharge status: Home / Self Care | Payer: PRIVATE HEALTH INSURANCE | Source: Ambulatory Visit | Attending: Family Medicine | Admitting: Family Medicine

## 2013-12-18 ENCOUNTER — Other Ambulatory Visit (HOSPITAL_COMMUNITY): Payer: Self-pay | Admitting: Family Medicine

## 2013-12-18 DIAGNOSIS — M79641 Pain in right hand: Secondary | ICD-10-CM | POA: Diagnosis present

## 2013-12-18 DIAGNOSIS — W230XXA Caught, crushed, jammed, or pinched between moving objects, initial encounter: Secondary | ICD-10-CM | POA: Insufficient documentation

## 2013-12-18 DIAGNOSIS — T148XXA Other injury of unspecified body region, initial encounter: Secondary | ICD-10-CM

## 2013-12-18 DIAGNOSIS — S6981XA Other specified injuries of right wrist, hand and finger(s), initial encounter: Secondary | ICD-10-CM | POA: Insufficient documentation

## 2014-08-10 ENCOUNTER — Emergency Department (HOSPITAL_COMMUNITY)
Admission: EM | Admit: 2014-08-10 | Discharge: 2014-08-10 | Disposition: A | Discharge status: Home / Self Care | Payer: Medicaid Other | Attending: Emergency Medicine | Admitting: Emergency Medicine

## 2014-08-10 ENCOUNTER — Encounter (HOSPITAL_COMMUNITY): Payer: Self-pay | Admitting: *Deleted

## 2014-08-10 DIAGNOSIS — Z79899 Other long term (current) drug therapy: Secondary | ICD-10-CM | POA: Diagnosis not present

## 2014-08-10 DIAGNOSIS — T63481A Toxic effect of venom of other arthropod, accidental (unintentional), initial encounter: Secondary | ICD-10-CM | POA: Diagnosis not present

## 2014-08-10 DIAGNOSIS — Z8719 Personal history of other diseases of the digestive system: Secondary | ICD-10-CM | POA: Insufficient documentation

## 2014-08-10 DIAGNOSIS — I10 Essential (primary) hypertension: Secondary | ICD-10-CM | POA: Diagnosis not present

## 2014-08-10 DIAGNOSIS — R000 Tachycardia, unspecified: Secondary | ICD-10-CM | POA: Insufficient documentation

## 2014-08-10 DIAGNOSIS — Z720 Tobacco use: Secondary | ICD-10-CM | POA: Insufficient documentation

## 2014-08-10 DIAGNOSIS — R2241 Localized swelling, mass and lump, right lower limb: Secondary | ICD-10-CM | POA: Diagnosis present

## 2014-08-10 DIAGNOSIS — Y998 Other external cause status: Secondary | ICD-10-CM | POA: Insufficient documentation

## 2014-08-10 DIAGNOSIS — Y9289 Other specified places as the place of occurrence of the external cause: Secondary | ICD-10-CM | POA: Insufficient documentation

## 2014-08-10 DIAGNOSIS — Y9389 Activity, other specified: Secondary | ICD-10-CM | POA: Insufficient documentation

## 2014-08-10 MED ORDER — KETOROLAC TROMETHAMINE 10 MG PO TABS
10.0000 mg | ORAL_TABLET | Freq: Once | ORAL | Status: AC
  Administered 2014-08-10: 10 mg via ORAL
  Filled 2014-08-10: qty 1

## 2014-08-10 MED ORDER — PREDNISONE 10 MG PO TABS: ORAL_TABLET | ORAL | Status: DC

## 2014-08-10 MED ORDER — DOXYCYCLINE HYCLATE 100 MG PO CAPS: 100.0000 mg | ORAL_CAPSULE | Freq: Two times a day (BID) | ORAL | Status: DC

## 2014-08-10 MED ORDER — DIPHENHYDRAMINE HCL 25 MG PO CAPS
25.0000 mg | ORAL_CAPSULE | Freq: Once | ORAL | Status: AC
  Administered 2014-08-10: 25 mg via ORAL
  Filled 2014-08-10: qty 1

## 2014-08-10 MED ORDER — PREDNISONE 50 MG PO TABS
60.0000 mg | ORAL_TABLET | Freq: Once | ORAL | Status: AC
  Administered 2014-08-10: 60 mg via ORAL
  Filled 2014-08-10 (×2): qty 1

## 2014-08-10 MED ORDER — MELOXICAM 15 MG PO TABS: 15.0000 mg | ORAL_TABLET | Freq: Every day | ORAL | Status: DC

## 2014-08-10 NOTE — ED Provider Notes (Signed)
CSN: [id number]     Arrival date & time 08/10/14  1346 History   First MD Initiated Contact with Patient 08/10/14 1427     Chief Complaint  Patient presents with  . Insect Bite     (Consider location/radiation/quality/duration/timing/severity/associated sxs/prior Treatment) HPI Comments: Patient is a 49 year old female who presents to the emergency department with a complaint of "something bit me on my foot". The patient states that 2 days ago something bit her on her right fifth toe. She also think she may have sustained a bite on the top of the foot. She now has swelling of the top of the foot. She has pain involving the right fifth toe. There's been no open areas, no drainage, no high fever, no red streaks appreciated. The patient is not diabetic. She presents now for additional assessment of this issue.  The history is provided by the patient.    Past Medical History  Diagnosis Date  . Rectal bleeding   . H/O colonoscopy   . Hypertension    Past Surgical History  Procedure Laterality Date  . Cesarean section    . Breast lumpectomy    . Dental surgery    . Tubal ligation     Family History  Problem Relation Age of Onset  . Heart disease     History  Substance Use Topics  . Smoking status: Current Every Day Smoker -- 0.50 packs/day    Types: Cigarettes  . Smokeless tobacco: Not on file  . Alcohol Use: No   OB History    No data available     Review of Systems  Constitutional: Negative for activity change.       All ROS Neg except as noted in HPI  HENT: Negative for nosebleeds.   Eyes: Negative for photophobia and discharge.  Respiratory: Negative for cough, shortness of breath and wheezing.   Cardiovascular: Negative for chest pain and palpitations.  Gastrointestinal: Negative for abdominal pain and blood in stool.  Genitourinary: Negative for dysuria, frequency and hematuria.  Musculoskeletal: Negative for back pain, arthralgias and neck pain.  Skin: Negative.    Neurological: Negative for dizziness, seizures and speech difficulty.  Psychiatric/Behavioral: Negative for hallucinations and confusion.      Allergies  Bee venom and Tramadol  Home Medications   Prior to Admission medications   Medication Sig Start Date End Date Taking? Authorizing Provider  albuterol (PROVENTIL HFA;VENTOLIN HFA) 108 (90 BASE) MCG/ACT inhaler Inhale 2 puffs into the lungs every 6 (six) hours as needed for wheezing or shortness of breath.   Yes Historical Provider, MD  ibuprofen (ADVIL,MOTRIN) 200 MG tablet Take 400 mg by mouth every 8 (eight) hours as needed for mild pain or moderate pain.   Yes Historical Provider, MD  guaiFENesin-codeine (ROBITUSSIN AC) 100-10 MG/5ML syrup Take 10 mLs by mouth 3 (three) times daily as needed. 11/17/13   Tammy Triplett, PA-C  predniSONE (DELTASONE) 10 MG tablet Take 6 tablets day one, 5 tablets day two, 4 tablets day three, 3 tablets day four, 2 tablets day five, then 1 tablet day six Patient not taking: Reported on 08/10/2014 11/17/13   Tammy Triplett, PA-C   BP 140/74 mmHg  Pulse 104  Temp(Src) 98.4 F (36.9 C) (Oral)  Resp 18  Ht 5\' 10"  (1.778 m)  Wt 210 lb (95.255 kg)  BMI 30.13 kg/m2  SpO2 97%  LMP 04/10/2014 Physical Exam  Constitutional: She is oriented to person, place, and time. She appears well-developed and well-nourished.  Non-toxic appearance.  HENT:  Head: Normocephalic.  Right Ear: Tympanic membrane and external ear normal.  Left Ear: Tympanic membrane and external ear normal.  Eyes: EOM and lids are normal. Pupils are equal, round, and reactive to light.  Neck: Normal range of motion. Neck supple. Carotid bruit is not present.  Cardiovascular: Regular rhythm, normal heart sounds, intact distal pulses and normal pulses.  Tachycardia present.   Pulmonary/Chest: Breath sounds normal. No respiratory distress.  Abdominal: Soft. Bowel sounds are normal. There is no tenderness. There is no guarding.   Musculoskeletal: Normal range of motion.  There is good range of motion of the right hip and knee and ankle. There is swelling of the dorsum of the right foot. There is some swelling of the fourth and fifth toes. There no lesions between the toes. There is pain to palpation over the fourth and fifth toe area extending into the tarsal area. The Achilles tendon is intact. Capillary refill is less than 2 seconds. Is no red streaking appreciated. The foot is not hot.  Lymphadenopathy:       Head (right side): No submandibular adenopathy present.       Head (left side): No submandibular adenopathy present.    She has no cervical adenopathy.  Neurological: She is alert and oriented to person, place, and time. She has normal strength. No cranial nerve deficit or sensory deficit.  Skin: Skin is warm and dry.  Psychiatric: She has a normal mood and affect. Her speech is normal.  Nursing note and vitals reviewed.   ED Course  Procedures (including critical care time) Labs Review Labs Reviewed - No data to display  Imaging Review No results found.   EKG Interpretation None      MDM  Patient has sustained a bite or sting to the right foot. Patient will be treated with Benadryl, prednisone, and doxycycline. Patient is to return to the emergency department if any signs of infection.    Final diagnoses:  None    *I have reviewed nursing notes, vital signs, and all appropriate lab and imaging results for this patient.[address] PA-C 08/10/14 1443  Ripley Fraise, MD 08/10/14 1539

## 2014-08-10 NOTE — Discharge Instructions (Signed)
Please keep your foot elevated above your waist. Lesion use your medications as prescribed, until all taken. Please see Dr. Lorriane Shire, or return to the emergency department if not improving. Insect Bite Mosquitoes, flies, fleas, bedbugs, and many other insects can bite. Insect bites are different from insect stings. A sting is when venom is injected into the skin. Some insect bites can transmit infectious diseases. SYMPTOMS  Insect bites usually turn red, swell, and itch for 2 to 4 days. They often go away on their own. TREATMENT  Your caregiver may prescribe antibiotic medicines if a bacterial infection develops in the bite. HOME CARE INSTRUCTIONS  Do not scratch the bite area.  Keep the bite area clean and dry. Wash the bite area thoroughly with soap and water.  Put ice or cool compresses on the bite area.  Put ice in a plastic bag.  Place a towel between your skin and the bag.  Leave the ice on for 20 minutes, 4 times a day for the first 2 to 3 days, or as directed.  You may apply a baking soda paste, cortisone cream, or calamine lotion to the bite area as directed by your caregiver. This can help reduce itching and swelling.  Only take over-the-counter or prescription medicines as directed by your caregiver.  If you are given antibiotics, take them as directed. Finish them even if you start to feel better. You may need a tetanus shot if:  You cannot remember when you had your last tetanus shot.  You have never had a tetanus shot.  The injury broke your skin. If you get a tetanus shot, your arm may swell, get red, and feel warm to the touch. This is common and not a problem. If you need a tetanus shot and you choose not to have one, there is a rare chance of getting tetanus. Sickness from tetanus can be serious. SEEK IMMEDIATE MEDICAL CARE IF:   You have increased pain, redness, or swelling in the bite area.  You see a red line on the skin coming from the bite.  You have a  fever.  You have joint pain.  You have a headache or neck pain.  You have unusual weakness.  You have a rash.  You have chest pain or shortness of breath.  You have abdominal pain, nausea, or vomiting.  You feel unusually tired or sleepy. MAKE SURE YOU:   Understand these instructions.  Will watch your condition.  Will get help right away if you are not doing well or get worse. Document Released: 02/20/2004 Document Revised: 04/06/2011 Document Reviewed: 08/13/2010 [facility] Patient Information 2015 [place], [place]. This information is not intended to replace advice given to you by your health care provider. Make sure you discuss any questions you have with your health care provider.

## 2014-08-10 NOTE — ED Notes (Signed)
Pt states she was bitten by something 2 days ago. Swelling to the top of the foot.

## 2015-04-08 ENCOUNTER — Inpatient Hospital Stay (HOSPITAL_COMMUNITY)
Admission: RE | Admit: 2015-04-08 | Discharge: 2015-04-08 | Disposition: A | Discharge status: Home / Self Care | Payer: Medicaid Other | Source: Ambulatory Visit

## 2015-04-08 NOTE — Pre-Procedure Instructions (Signed)
    Autumn [name]  04/08/2015    Your procedure is scheduled on Friday, March 17.  Report to [facility] Admitting at 8:20AM  Call this number if you have problems the morning of surgery:[phone]   Remember:  Do not eat food or drink liquids after midnight Thursday March 16   Take these medicines the morning of surgery with A SIP OF WATER:  Take if needed:acetaminophen (TYLENOL), meclizine (ANTIVERT), SUMAtriptan (IMITREX).   Do not wear jewelry, make-up or nail polish.  Do not wear lotions, powders, or perfumes.  Do not shave 48 hours prior to surgery.   Do not bring valuables to the hospital.  [facility] is not responsible for any belongings or valuables.  Contacts, dentures or bridgework may not be worn into surgery.  Leave your suitcase in the car.  After surgery it may be brought to your room.  For patients admitted to the hospital, discharge time will be determined by your treatment team.  Patients discharged the day of surgery will not be allowed to drive home.   Name and phone number of your driver:   -  Special instructions:  Review  [facility] - Preparing For Surgery.  Please read over the following fact sheets that you were given. Pain Booklet, Coughing and Deep Breathing and Surgical Site Infection Prevention

## 2015-04-09 ENCOUNTER — Encounter (HOSPITAL_COMMUNITY)
Admission: RE | Admit: 2015-04-09 | Discharge: 2015-04-09 | Disposition: A | Discharge status: Home / Self Care | Payer: Medicare Other | Source: Ambulatory Visit | Attending: Oral Surgery | Admitting: Oral Surgery

## 2015-04-09 ENCOUNTER — Encounter (HOSPITAL_COMMUNITY): Payer: Self-pay

## 2015-04-09 DIAGNOSIS — R000 Tachycardia, unspecified: Secondary | ICD-10-CM | POA: Diagnosis not present

## 2015-04-09 DIAGNOSIS — K219 Gastro-esophageal reflux disease without esophagitis: Secondary | ICD-10-CM | POA: Diagnosis not present

## 2015-04-09 DIAGNOSIS — F17200 Nicotine dependence, unspecified, uncomplicated: Secondary | ICD-10-CM | POA: Diagnosis not present

## 2015-04-09 DIAGNOSIS — Z01812 Encounter for preprocedural laboratory examination: Secondary | ICD-10-CM | POA: Diagnosis not present

## 2015-04-09 DIAGNOSIS — I10 Essential (primary) hypertension: Secondary | ICD-10-CM | POA: Insufficient documentation

## 2015-04-09 DIAGNOSIS — K089 Disorder of teeth and supporting structures, unspecified: Secondary | ICD-10-CM | POA: Diagnosis not present

## 2015-04-09 DIAGNOSIS — Z01818 Encounter for other preprocedural examination: Secondary | ICD-10-CM | POA: Diagnosis present

## 2015-04-09 HISTORY — DX: Adverse effect of unspecified anesthetic, initial encounter: T41.45XA

## 2015-04-09 HISTORY — DX: Unspecified osteoarthritis, unspecified site: M19.90

## 2015-04-09 HISTORY — DX: Anxiety disorder, unspecified: F41.9

## 2015-04-09 HISTORY — DX: Other complications of anesthesia, initial encounter: T88.59XA

## 2015-04-09 HISTORY — DX: Gastro-esophageal reflux disease without esophagitis: K21.9

## 2015-04-09 HISTORY — DX: Reserved for inherently not codable concepts without codable children: IMO0001

## 2015-04-09 LAB — BASIC METABOLIC PANEL
Anion gap: 8 (ref 5–15)
BUN: 7 mg/dL (ref 6–20)
CO2: 23 mmol/L (ref 22–32)
CREATININE: 0.75 mg/dL (ref 0.44–1.00)
Calcium: 9.6 mg/dL (ref 8.9–10.3)
Chloride: 107 mmol/L (ref 101–111)
GFR calc Af Amer: >60 mL/min (ref >60)
GLUCOSE: 131 mg/dL — AB (ref 65–99)
Potassium: 3.5 mmol/L (ref 3.5–5.1)
SODIUM: 138 mmol/L (ref 135–145)

## 2015-04-09 LAB — CBC
HEMATOCRIT: 37.4 % (ref 36.0–46.0)
Hemoglobin: 11.5 g/dL — ABNORMAL LOW (ref 12.0–15.0)
MCH: 21.8 pg — ABNORMAL LOW (ref 26.0–34.0)
MCHC: 30.7 g/dL (ref 30.0–36.0)
MCV: 71 fL — AB (ref 78.0–100.0)
PLATELETS: 411 10*3/uL — AB (ref 150–400)
RBC: 5.27 MIL/uL — ABNORMAL HIGH (ref 3.87–5.11)
RDW: 14.8 % (ref 11.5–15.5)
WBC: 8.3 10*3/uL (ref 4.0–10.5)

## 2015-04-09 LAB — HCG, SERUM, QUALITATIVE: Preg, Serum: NEGATIVE

## 2015-04-09 NOTE — Progress Notes (Signed)
Autumn [name] denies chest pain, she states that she has had palpations in the past and had a cardiac workup- butu does not remember what cardiologist she saw.  Patient reports that the last time she had palpations was over a year ago and each time it has been during a stressful event.  I requested labs from, Dr Denita Lung office.

## 2015-04-09 NOTE — H&P (Signed)
HISTORY AND PHYSICAL  Autumn [name] is a 50 y.o. female patient referred by general dentist for removal remaining lower teeth in preparation for dentures.  No diagnosis found.  Past Medical History  Diagnosis Date  . Rectal bleeding   . H/O colonoscopy   . Hypertension   . Complication of anesthesia     had some hallucations  . Shortness of breath dyspnea     with exertion  . Anxiety     Panic attacks- "cries"  . GERD (gastroesophageal reflux disease)   . Arthritis     No current facility-administered medications for this encounter.   Current Outpatient Prescriptions  Medication Sig Dispense Refill  . acetaminophen (TYLENOL) 500 MG tablet Take 500 mg by mouth 2 (two) times daily as needed for moderate pain.    . meclizine (ANTIVERT) 25 MG tablet Take 25 mg by mouth daily as needed for dizziness.   3  . SUMAtriptan (IMITREX) 100 MG tablet Take 100 mg by mouth daily as needed for migraine.   4   Allergies  Allergen Reactions  . Bee Venom Anaphylaxis  . Tramadol Hives   Active Problems:   * No active hospital problems. *  Vitals: There were no vitals taken for this visit. Lab results: Results for orders placed or performed during the hospital encounter of 04/09/15 (from the past 24 hour(s))  CBC     Status: Abnormal   Collection Time: 04/09/15 10:18 AM  Result Value Ref Range   WBC 8.3 4.0 - 10.5 K/uL   RBC 5.27 (H) 3.87 - 5.11 MIL/uL   Hemoglobin 11.5 (L) 12.0 - 15.0 g/dL   HCT 37.4 36.0 - 46.0 %   MCV 71.0 (L) 78.0 - 100.0 fL   MCH 21.8 (L) 26.0 - 34.0 pg   MCHC 30.7 30.0 - 36.0 g/dL   RDW 14.8 11.5 - 15.5 %   Platelets 411 (H) 150 - 400 K/uL   Radiology Results: No results found. General appearance: alert, cooperative and moderately obese Head: Normocephalic, without obvious abnormality, atraumatic Eyes: negative Nose: Nares normal. Septum midline. Mucosa normal. No drainage or sinus tenderness. Throat: Edentulous maxilla. Pharynx clear. caries and  bone loss remaining lower teeth. Neck: no adenopathy, supple, symmetrical, trachea midline and thyroid not enlarged, symmetric, no tenderness/mass/nodules Resp: clear to auscultation bilaterally Cardio: regular rate and rhythm, S1, S2 normal, no murmur, click, rub or gallop  Assessment: Nonrestorable mandibular teeth 20-25, 28,29.   Plan: Dental extractions with alveoloplasty. General anesthesia. Day surgery.   Gae Bon 04/09/2015

## 2015-04-10 NOTE — Progress Notes (Signed)
Anesthesia Chart Review:  Pt is a 50 year old female scheduled for multiple dental extractions with aleveoloplasty on 04/12/2015 with Dr. Hoyt Koch.   PMH includes:  HTN (no BP meds; BP was 145/82 at PAT), GERD. Current smoker. BMI 40  Preoperative labs reviewed.    EKG 04/09/15: Sinus tachycardia (106 bpm). Possible Left atrial enlargement. Septal infarct, age undetermined. Since last tracing, septal infarct is now present per Dr. Tyrell Antonio interpretation.   Reviewed case with Dr. Glennon Mac. No CV symptoms documented at PAT Will repeat EKG DOS.  If EKG acceptable DOS, I anticipate pt can proceed as scheduled.   Willeen Cass, FNP-BC [facility] Short Stay Surgical [facility]/Anesthesiology Phone: [phone] 04/10/2015 3:36 PM

## 2015-04-12 ENCOUNTER — Ambulatory Visit (HOSPITAL_COMMUNITY)
Admission: RE | Admit: 2015-04-12 | Discharge: 2015-04-12 | Disposition: A | Discharge status: Home / Self Care | Payer: Medicare Other | Source: Ambulatory Visit | Attending: Oral Surgery | Admitting: Oral Surgery

## 2015-04-12 ENCOUNTER — Encounter (HOSPITAL_COMMUNITY): Payer: Self-pay | Admitting: Anesthesiology

## 2015-04-12 ENCOUNTER — Encounter (HOSPITAL_COMMUNITY)
Admission: RE | Disposition: A | Discharge status: Home / Self Care | Payer: Self-pay | Source: Ambulatory Visit | Attending: Oral Surgery

## 2015-04-12 ENCOUNTER — Ambulatory Visit (HOSPITAL_COMMUNITY): Payer: Medicare Other | Admitting: Emergency Medicine

## 2015-04-12 ENCOUNTER — Ambulatory Visit (HOSPITAL_COMMUNITY): Payer: Medicare Other | Admitting: Anesthesiology

## 2015-04-12 DIAGNOSIS — F17200 Nicotine dependence, unspecified, uncomplicated: Secondary | ICD-10-CM | POA: Insufficient documentation

## 2015-04-12 DIAGNOSIS — I10 Essential (primary) hypertension: Secondary | ICD-10-CM | POA: Insufficient documentation

## 2015-04-12 DIAGNOSIS — M1990 Unspecified osteoarthritis, unspecified site: Secondary | ICD-10-CM | POA: Insufficient documentation

## 2015-04-12 DIAGNOSIS — K029 Dental caries, unspecified: Secondary | ICD-10-CM | POA: Diagnosis not present

## 2015-04-12 DIAGNOSIS — K0889 Other specified disorders of teeth and supporting structures: Secondary | ICD-10-CM | POA: Insufficient documentation

## 2015-04-12 DIAGNOSIS — Z79899 Other long term (current) drug therapy: Secondary | ICD-10-CM | POA: Insufficient documentation

## 2015-04-12 HISTORY — PX: MULTIPLE EXTRACTIONS WITH ALVEOLOPLASTY: SHX5342

## 2015-04-12 SURGERY — MULTIPLE EXTRACTION WITH ALVEOLOPLASTY
Anesthesia: General | Site: Mouth | Laterality: Bilateral

## 2015-04-12 MED ORDER — LACTATED RINGERS IV SOLN
INTRAVENOUS | Status: DC
  Administered 2015-04-12: 50 mL/h via INTRAVENOUS

## 2015-04-12 MED ORDER — FENTANYL CITRATE (PF) 100 MCG/2ML IJ SOLN
INTRAMUSCULAR | Status: DC | PRN
  Administered 2015-04-12 (×3): 50 ug via INTRAVENOUS

## 2015-04-12 MED ORDER — FENTANYL CITRATE (PF) 100 MCG/2ML IJ SOLN
25.0000 ug | INTRAMUSCULAR | Status: DC | PRN
  Administered 2015-04-12 (×4): 25 ug via INTRAVENOUS

## 2015-04-12 MED ORDER — ONDANSETRON HCL 4 MG/2ML IJ SOLN: 4.0000 mg | Freq: Four times a day (QID) | INTRAMUSCULAR | Status: DC | PRN

## 2015-04-12 MED ORDER — LIDOCAINE HCL (CARDIAC) 20 MG/ML IV SOLN
INTRAVENOUS | Status: DC | PRN
  Administered 2015-04-12: 60 mg via INTRAVENOUS

## 2015-04-12 MED ORDER — 0.9 % SODIUM CHLORIDE (POUR BTL) OPTIME
TOPICAL | Status: DC | PRN
  Administered 2015-04-12: 1000 mL

## 2015-04-12 MED ORDER — OXYCODONE HCL 5 MG/5ML PO SOLN: 5.0000 mg | Freq: Once | ORAL | Status: DC | PRN

## 2015-04-12 MED ORDER — OXYMETAZOLINE HCL 0.05 % NA SOLN
NASAL | Status: DC | PRN
  Administered 2015-04-12: 1

## 2015-04-12 MED ORDER — ONDANSETRON HCL 4 MG/2ML IJ SOLN
INTRAMUSCULAR | Status: AC
  Filled 2015-04-12: qty 2

## 2015-04-12 MED ORDER — SUCCINYLCHOLINE CHLORIDE 20 MG/ML IJ SOLN
INTRAMUSCULAR | Status: DC | PRN
  Administered 2015-04-12: 120 mg via INTRAVENOUS

## 2015-04-12 MED ORDER — PROPOFOL 10 MG/ML IV BOLUS
INTRAVENOUS | Status: DC | PRN
  Administered 2015-04-12: 200 mg via INTRAVENOUS
  Administered 2015-04-12: 50 mg via INTRAVENOUS

## 2015-04-12 MED ORDER — LIDOCAINE-EPINEPHRINE 2 %-1:100000 IJ SOLN
INTRAMUSCULAR | Status: AC
  Filled 2015-04-12: qty 1

## 2015-04-12 MED ORDER — ONDANSETRON HCL 4 MG/2ML IJ SOLN
INTRAMUSCULAR | Status: DC | PRN
  Administered 2015-04-12: 4 mg via INTRAVENOUS

## 2015-04-12 MED ORDER — FENTANYL CITRATE (PF) 100 MCG/2ML IJ SOLN
INTRAMUSCULAR | Status: AC
  Filled 2015-04-12: qty 2

## 2015-04-12 MED ORDER — FENTANYL CITRATE (PF) 250 MCG/5ML IJ SOLN
INTRAMUSCULAR | Status: AC
  Filled 2015-04-12: qty 5

## 2015-04-12 MED ORDER — OXYMETAZOLINE HCL 0.05 % NA SOLN
NASAL | Status: AC
  Filled 2015-04-12: qty 15

## 2015-04-12 MED ORDER — MIDAZOLAM HCL 2 MG/2ML IJ SOLN
INTRAMUSCULAR | Status: AC
  Filled 2015-04-12: qty 2

## 2015-04-12 MED ORDER — OXYCODONE-ACETAMINOPHEN 5-325 MG PO TABS: 1.0000 | ORAL_TABLET | ORAL | Status: DC | PRN

## 2015-04-12 MED ORDER — OXYCODONE HCL 5 MG PO TABS: 5.0000 mg | ORAL_TABLET | Freq: Once | ORAL | Status: DC | PRN

## 2015-04-12 MED ORDER — PROPOFOL 10 MG/ML IV BOLUS
INTRAVENOUS | Status: AC
  Filled 2015-04-12: qty 20

## 2015-04-12 MED ORDER — LIDOCAINE-EPINEPHRINE 2 %-1:100000 IJ SOLN
INTRAMUSCULAR | Status: DC | PRN
  Administered 2015-04-12: 10 mL via INTRADERMAL

## 2015-04-12 MED ORDER — SUCCINYLCHOLINE CHLORIDE 20 MG/ML IJ SOLN
INTRAMUSCULAR | Status: AC
  Filled 2015-04-12: qty 1

## 2015-04-12 MED ORDER — PROPOFOL 10 MG/ML IV BOLUS
INTRAVENOUS | Status: AC
Start: 2015-04-12 — End: 2015-04-12
  Filled 2015-04-12: qty 20

## 2015-04-12 MED ORDER — LIDOCAINE HCL (CARDIAC) 20 MG/ML IV SOLN
INTRAVENOUS | Status: AC
  Filled 2015-04-12: qty 5

## 2015-04-12 SURGICAL SUPPLY — 28 items
BUR CROSS CUT FISSURE 1.6 (BURR) ×2 IMPLANT
BUR CROSS CUT FISSURE 1.6MM (BURR) ×1
BUR EGG ELITE 4.0 (BURR) ×2 IMPLANT
BUR EGG ELITE 4.0MM (BURR) ×1
CANISTER SUCTION 2500CC (MISCELLANEOUS) ×3 IMPLANT
COVER SURGICAL LIGHT HANDLE (MISCELLANEOUS) ×3 IMPLANT
CRADLE DONUT ADULT HEAD (MISCELLANEOUS) ×3 IMPLANT
DRAPE U-SHAPE 76X120 STRL (DRAPES) ×3 IMPLANT
FLUID NSS /IRRIG 1000 ML XXX (MISCELLANEOUS) ×3 IMPLANT
GAUZE PACKING FOLDED 2  STR (GAUZE/BANDAGES/DRESSINGS) ×2
GAUZE PACKING FOLDED 2 STR (GAUZE/BANDAGES/DRESSINGS) ×1 IMPLANT
GLOVE BIO SURGEON STRL SZ7.5 (GLOVE) ×3 IMPLANT
GLOVE BIOGEL PI IND STRL 7.0 (GLOVE) ×1 IMPLANT
GLOVE BIOGEL PI INDICATOR 7.0 (GLOVE) ×2
GOWN STRL REUS W/ TWL LRG LVL3 (GOWN DISPOSABLE) ×1 IMPLANT
GOWN STRL REUS W/ TWL XL LVL3 (GOWN DISPOSABLE) ×1 IMPLANT
GOWN STRL REUS W/TWL LRG LVL3 (GOWN DISPOSABLE) ×2
GOWN STRL REUS W/TWL XL LVL3 (GOWN DISPOSABLE) ×2
KIT BASIN OR (CUSTOM PROCEDURE TRAY) ×3 IMPLANT
KIT ROOM TURNOVER OR (KITS) ×3 IMPLANT
NEEDLE 22X1 1/2 (OR ONLY) (NEEDLE) ×6 IMPLANT
NS IRRIG 1000ML POUR BTL (IV SOLUTION) ×3 IMPLANT
PAD ARMBOARD 7.5X6 YLW CONV (MISCELLANEOUS) ×3 IMPLANT
SUT CHROMIC 3 0 PS 2 (SUTURE) ×6 IMPLANT
SYR CONTROL 10ML LL (SYRINGE) ×3 IMPLANT
TRAY ENT MC OR (CUSTOM PROCEDURE TRAY) ×3 IMPLANT
TUBING IRRIGATION (MISCELLANEOUS) ×3 IMPLANT
YANKAUER SUCT BULB TIP NO VENT (SUCTIONS) ×3 IMPLANT

## 2015-04-12 NOTE — Anesthesia Procedure Notes (Signed)
Procedure Name: Intubation Date/Time: 04/12/2015 12:10 PM Performed by: Williemae Area B Pre-anesthesia Checklist: Patient identified, Emergency Drugs available, Suction available and Patient being monitored Patient Re-evaluated:Patient Re-evaluated prior to inductionOxygen Delivery Method: Circle system utilized Intubation Type: IV induction Ventilation: Mask ventilation without difficulty Laryngoscope Size: Mac and 3 Grade View: Grade II Nasal Tubes: Right, Nasal prep performed and Magill forceps- large, utilized Tube size: 7.0 mm Number of attempts: 2 (KJ: DVL with good view but active dark red bleeding into pharynx precluded view to finish passing NETT;  Dr. Royce Macadamia accomplished the job.  ) Placement Confirmation: ETT inserted through vocal cords under direct vision,  positive ETCO2 and breath sounds checked- equal and bilateral Tube secured with: Tape (taped across nose) Dental Injury: Teeth and Oropharynx as per pre-operative assessment

## 2015-04-12 NOTE — H&P (Signed)
H&P documentation  -History and Physical Reviewed  -Patient has been re-examined  -No change in the plan of care  Autumn [name] M  

## 2015-04-12 NOTE — Anesthesia Preprocedure Evaluation (Signed)
Anesthesia Evaluation  Patient identified by MRN, date of birth, ID band Patient awake    Reviewed: Allergy & Precautions, NPO status , Patient's Chart, lab work & pertinent test results  Airway Mallampati: II   Neck ROM: full    Dental   Pulmonary shortness of breath, Current Smoker,    breath sounds clear to auscultation       Cardiovascular hypertension,  Rhythm:regular Rate:Normal     Neuro/Psych Anxiety    GI/Hepatic GERD  ,  Endo/Other  Morbid obesity  Renal/GU      Musculoskeletal  (+) Arthritis ,   Abdominal   Peds  Hematology   Anesthesia Other Findings   Reproductive/Obstetrics                             Anesthesia Physical Anesthesia Plan  ASA: II  Anesthesia Plan: General   Post-op Pain Management:    Induction: Intravenous  Airway Management Planned: Nasal ETT  Additional Equipment:   Intra-op Plan:   Post-operative Plan: Extubation in OR  Informed Consent: I have reviewed the patients History and Physical, chart, labs and discussed the procedure including the risks, benefits and alternatives for the proposed anesthesia with the patient or authorized representative who has indicated his/her understanding and acceptance.     Plan Discussed with: CRNA, Anesthesiologist and Surgeon  Anesthesia Plan Comments:         Anesthesia Quick Evaluation

## 2015-04-12 NOTE — Op Note (Signed)
04/12/2015  12:39 PM  PATIENT:  Autumn [name]  49 y.o. female  PRE-OPERATIVE DIAGNOSIS:  NON RESTORABLE TEETH # 20, 21, 22, 23, 24, 25, 28, 29  POST-OPERATIVE DIAGNOSIS:  SAME  PROCEDURE:  Procedure(s): BILATERAL MULTIPLE EXTRACTIONS NUMBERS TWENTY, TWENTY ONE, TWENTY TWO, TWENTY THREE, TWENTY FOUR, TWENTY FIVE, TWENTY EIGHT, TWENTY NINE WITH ALVEOLOPLASTY  SURGEON:  Surgeon(s): Diona Browner, DDS  ANESTHESIA:   local and general  EBL:  minimal  DRAINS: none   SPECIMEN:  No Specimen  COUNTS:  YES  PLAN OF CARE: Discharge to home after PACU  PATIENT DISPOSITION:  PACU - hemodynamically stable.   PROCEDURE DETAILS: Dictation # [id number]  Gae Bon, DMD 04/12/2015 12:39 PM

## 2015-04-12 NOTE — Transfer of Care (Signed)
Immediate Anesthesia Transfer of Care Note  Patient: Autumn [name]  Procedure(s) Performed: Procedure(s): BILATERAL MULTIPLE EXTRACTIONS NUMBERS TWENTY, TWENTY ONE, TWENTY TWO, TWENTY THREE, TWENTY FOUR, TWENTY FIVE, TWENTY EIGHT, TWENTY NINE WITH ALVEOLOPLASTY (Bilateral)  Patient Location: PACU  Anesthesia Type:General  Level of Consciousness: awake, alert , oriented and patient cooperative  Airway & Oxygen Therapy: Patient Spontanous Breathing and Patient connected to face mask  Post-op Assessment: Report given to RN, Post -op Vital signs reviewed and stable and Patient moving all extremities  Post vital signs: Reviewed and stable  Last Vitals:  Filed Vitals:   04/12/15 0849 04/12/15 1259  BP: 170/69 120/74  Pulse: 97 108  Temp: 36.9 C 36.3 C  Resp: 18 20    Complications: No apparent anesthesia complications

## 2015-04-12 NOTE — Op Note (Signed)
NAMESHANIESHA, Autumn [name]         ACCOUNT NO.:  [id number]  MEDICAL RECORD NO.:  [id number]  LOCATION:  [facility]                         FACILITY:  [facility]  PHYSICIAN:  Gae Bon, M.D.  DATE OF BIRTH:  06-May-1965  DATE OF PROCEDURE:  04/12/2015 DATE OF DISCHARGE:                              OPERATIVE REPORT   PREOPERATIVE DIAGNOSIS:  Nonrestorable teeth #20, 21, 22, 23, 24, 25, 28, 29.  POSTOPERATIVE DIAGNOSIS:  Nonrestorable teeth #20, 21, 22, 23, 24, 25, 28, 29.  PROCEDURE:  Extraction of teeth #20, 21, 22, 23, 24, 25, 28, 29. Alveoplasty of right and left mandible.  SURGEON:  Gae Bon, M.D.  ANESTHESIA:  General, nasal intubation.  DESCRIPTION OF PROCEDURE:  The patient was taken to the operating room and placed on the table in supine position.  General anesthesia was administered.  A nasal endotracheal tube was placed and secured.  The eyes were protected and the patient was draped for the procedure.  Time- out was performed.  The posterior pharynx was suctioned and the throat pack was placed.  A 2% lidocaine with 1:100,000 epinephrine was infiltrated in an inferior alveolar block on the right and left sides and buccal anterior mandibular infiltration, total of 10 mL was utilized.  A bite block was placed in the right side of the mouth and a sweetheart retractor was used to retract the tongue.  A #15 blade was used to make an incision around teeth #20, 21, 22, 23, 24, 25 on the buccal and lingual aspects.  The periosteum was reflected and the teeth were elevated with a 301 elevator.  They were then extracted with Asch forceps.  Then, the sockets were curetted.  The periosteum was further reflected to expose the alveolar crest, and an egg-shaped bur and bone file were used to perform an alveoplasty.  Then, the areas were irrigated and closed with 3-0 chromic.  The bite block and sweetheart retractor were repositioned to the other side of the mouth and a 15- blade  was used to make an incision around teeth #28 and 29.  The periosteum was reflected with a periosteal elevator.  The teeth were elevated with a 301 elevator and removed from the mouth with Asch forceps.  Then, the sockets were curetted and the incision on this side was lengthened, approximately 1 cm both proximally and distally to allow for adequate reflection of bone for the alveoplasty.  The periosteum was reflected with a periosteal elevator to expose the alveolar crest in this region and then bony prominence was reduced with alveoplasty technique using the egg-shaped bur and bone file.  Then, the area was irrigated and closed with 3-0 chromic.  The oral cavity was inspected, irrigated, suctioned and then, the throat pack was removed.  The patient was awakened, taken to the recovery room, breathing spontaneously in good condition.  ESTIMATED BLOOD LOSS:  Minimum.  COMPLICATIONS:  None.  SPECIMENS:  None.     Gae Bon, M.D.     SMJ/MEDQ  D:  04/12/2015  T:  04/12/2015  Job:  [id number]

## 2015-04-14 NOTE — Anesthesia Postprocedure Evaluation (Signed)
Anesthesia Post Note  Patient: Autumn [name]  Procedure(s) Performed: Procedure(s) (LRB): BILATERAL MULTIPLE EXTRACTIONS NUMBERS TWENTY, TWENTY ONE, TWENTY TWO, TWENTY THREE, TWENTY FOUR, TWENTY FIVE, TWENTY EIGHT, TWENTY NINE WITH ALVEOLOPLASTY (Bilateral)  Patient location during evaluation: PACU Anesthesia Type: General Level of consciousness: awake and alert and oriented Pain management: pain level controlled Vital Signs Assessment: post-procedure vital signs reviewed and stable Respiratory status: spontaneous breathing, nonlabored ventilation and respiratory function stable Cardiovascular status: blood pressure returned to baseline and stable Postop Assessment: no signs of nausea or vomiting Anesthetic complications: no    Last Vitals:  Filed Vitals:   04/12/15 1400 04/12/15 1429  BP: 122/71   Pulse: 95   Temp: 37 C 36.4 C  Resp: 21     Last Pain:  Filed Vitals:   04/12/15 1503  PainSc: 3                  Estefanny Moler A.

## 2015-04-15 ENCOUNTER — Encounter (HOSPITAL_COMMUNITY): Payer: Self-pay | Admitting: Oral Surgery

## 2015-08-14 ENCOUNTER — Encounter (HOSPITAL_COMMUNITY): Payer: Self-pay | Admitting: Emergency Medicine

## 2015-08-14 ENCOUNTER — Emergency Department (HOSPITAL_COMMUNITY)
Admission: EM | Admit: 2015-08-14 | Discharge: 2015-08-14 | Disposition: A | Discharge status: Home / Self Care | Payer: Medicare Other | Attending: Emergency Medicine | Admitting: Emergency Medicine

## 2015-08-14 DIAGNOSIS — Z79899 Other long term (current) drug therapy: Secondary | ICD-10-CM | POA: Diagnosis not present

## 2015-08-14 DIAGNOSIS — F17210 Nicotine dependence, cigarettes, uncomplicated: Secondary | ICD-10-CM | POA: Diagnosis not present

## 2015-08-14 DIAGNOSIS — I10 Essential (primary) hypertension: Secondary | ICD-10-CM | POA: Insufficient documentation

## 2015-08-14 DIAGNOSIS — R42 Dizziness and giddiness: Secondary | ICD-10-CM | POA: Insufficient documentation

## 2015-08-14 DIAGNOSIS — K625 Hemorrhage of anus and rectum: Secondary | ICD-10-CM | POA: Insufficient documentation

## 2015-08-14 DIAGNOSIS — M1990 Unspecified osteoarthritis, unspecified site: Secondary | ICD-10-CM | POA: Diagnosis not present

## 2015-08-14 DIAGNOSIS — R1084 Generalized abdominal pain: Secondary | ICD-10-CM | POA: Insufficient documentation

## 2015-08-14 DIAGNOSIS — K5900 Constipation, unspecified: Secondary | ICD-10-CM | POA: Diagnosis present

## 2015-08-14 LAB — URINALYSIS, ROUTINE W REFLEX MICROSCOPIC
BILIRUBIN URINE: NEGATIVE
GLUCOSE, UA: NEGATIVE mg/dL
Ketones, ur: NEGATIVE mg/dL
NITRITE: NEGATIVE
PH: 6 (ref 5.0–8.0)
SPECIFIC GRAVITY, URINE: 1.025 (ref 1.005–1.030)

## 2015-08-14 LAB — I-STAT CHEM 8, ED
BUN: 10 mg/dL (ref 6–20)
CALCIUM ION: 1.19 mmol/L (ref 1.13–1.30)
CHLORIDE: 102 mmol/L (ref 101–111)
Creatinine, Ser: 0.8 mg/dL (ref 0.44–1.00)
GLUCOSE: 117 mg/dL — AB (ref 65–99)
HCT: 40 % (ref 36.0–46.0)
Hemoglobin: 13.6 g/dL (ref 12.0–15.0)
Potassium: 4.2 mmol/L (ref 3.5–5.1)
SODIUM: 138 mmol/L (ref 135–145)
TCO2: 24 mmol/L (ref 0–100)

## 2015-08-14 LAB — URINE MICROSCOPIC-ADD ON

## 2015-08-14 LAB — POC URINE PREG, ED: Preg Test, Ur: NEGATIVE

## 2015-08-14 LAB — POC OCCULT BLOOD, ED: FECAL OCCULT BLD: POSITIVE — AB

## 2015-08-14 MED ORDER — ONDANSETRON 8 MG PO TBDP
8.0000 mg | ORAL_TABLET | Freq: Once | ORAL | Status: AC
  Administered 2015-08-14: 8 mg via ORAL
  Filled 2015-08-14: qty 1

## 2015-08-14 MED ORDER — ACETAMINOPHEN 325 MG PO TABS
650.0000 mg | ORAL_TABLET | Freq: Once | ORAL | Status: AC
  Administered 2015-08-14: 650 mg via ORAL
  Filled 2015-08-14: qty 2

## 2015-08-14 NOTE — Discharge Instructions (Signed)
Abdominal (belly) pain can be caused by many things. any cases can be observed and treated at home after initial evaluation in the emergency department. Even though you are being discharged home, abdominal pain can be unpredictable. Therefore, you need a repeated exam if your pain does not resolve, returns, or worsens. Most patients with abdominal pain don't have to be admitted to the hospital or have surgery, but serious problems like appendicitis and gallbladder attacks can start out as nonspecific pain. Many abdominal conditions cannot be diagnosed in one visit, so follow-up evaluations are very important. SEEK IMMEDIATE MEDICAL ATTENTION IF: The pain does not go away or becomes severe, particularly over the next 8-12 hours.  A temperature above 100.41F develops.  Repeated vomiting occurs (multiple episodes).  The pain becomes localized to portions of the abdomen. The right side could possibly be appendicitis. In an adult, the left lower portion of the abdomen could be colitis or diverticulitis.  Return also if you develop chest pain, difficulty breathing, dizziness or fainting, or become confused, poorly responsive, or inconsolable.

## 2015-08-14 NOTE — ED Provider Notes (Signed)
CSN: [id number]     Arrival date & time 08/14/15  [id number] History   First MD Initiated Contact with Patient 08/14/15 0530     Chief Complaint  Patient presents with  . Constipation  . Rectal Bleeding    Patient is a 50 y.o. female presenting with constipation and hematochezia. The history is provided by the patient.  Constipation Severity:  Moderate Timing:  Constant Progression:  Unchanged Chronicity:  Chronic Relieved by:  Nothing Worsened by:  Nothing tried Associated symptoms: abdominal pain and hematochezia   Associated symptoms: no fever   Rectal Bleeding Associated symptoms: abdominal pain and light-headedness   Associated symptoms: no fever   patient presents for multiple complaints:  She has had abdominal pain chronically for "years" She also reports chronic constipation as well She also reports frequent episodes of rectal bleeding with up to 3 colonoscopies without any known cause of rectal bleeding No fever No hematemesis She is not on anticoagulants or NSAID  She also reports she just recently had vaginal bleeding after not having her period for "months"  She reports earlier tonight, she was taking OTC laxatives and she felt lightheaded and her daughter called ambulance to transport her here to the ED No syncope reported  Past Medical History  Diagnosis Date  . Rectal bleeding   . H/O colonoscopy   . Hypertension   . Complication of anesthesia     had some hallucations  . Shortness of breath dyspnea     with exertion  . Anxiety     Panic attacks- "cries"  . GERD (gastroesophageal reflux disease)   . Arthritis    Past Surgical History  Procedure Laterality Date  . Cesarean section      x3  . Dental surgery    . Tubal ligation    . Breast lumpectomy Right   . Colonoscopy    . Multiple extractions with alveoloplasty Bilateral 04/12/2015    Procedure: BILATERAL MULTIPLE EXTRACTIONS NUMBERS TWENTY, TWENTY ONE, TWENTY TWO, TWENTY THREE, TWENTY FOUR, TWENTY  FIVE, TWENTY EIGHT, TWENTY NINE WITH ALVEOLOPLASTY;  Surgeon: Diona Browner, DDS;  Location: [facility];  Service: Oral Surgery;  Laterality: Bilateral;   Family History  Problem Relation Age of Onset  . Heart disease     Social History  Substance Use Topics  . Smoking status: Current Every Day Smoker -- 0.50 packs/day for 31 years    Types: Cigarettes  . Smokeless tobacco: None  . Alcohol Use: No   OB History    No data available     Review of Systems  Constitutional: Negative for fever.  Respiratory: Negative for shortness of breath.   Cardiovascular: Negative for chest pain.  Gastrointestinal: Positive for abdominal pain, constipation, blood in stool and hematochezia.  Genitourinary: Positive for vaginal bleeding.  Neurological: Positive for light-headedness. Negative for syncope.  All other systems reviewed and are negative.     Allergies  Bee venom; Tramadol; and Asa  Home Medications   Prior to Admission medications   Medication Sig Start Date End Date Taking? Authorizing Provider  meclizine (ANTIVERT) 25 MG tablet Take 25 mg by mouth daily as needed for dizziness.  02/27/15   Historical Provider, MD  oxyCODONE-acetaminophen (PERCOCET) 5-325 MG tablet Take 1-2 tablets by mouth every 4 (four) hours as needed for severe pain. 04/12/15   Diona Browner, DDS  SUMAtriptan (IMITREX) 100 MG tablet Take 100 mg by mouth daily as needed for migraine.  03/15/15   Historical Provider, MD   BP  137/82 mmHg  Pulse 90  Temp(Src) 97.9 F (36.6 C) (Oral)  Resp 24  Ht 5\' 5"  (1.651 m)  Wt 108.863 kg  BMI 39.94 kg/m2  SpO2 100%  LMP 08/14/2015 Physical Exam CONSTITUTIONAL: Well developed/well nourished HEAD: Normocephalic/atraumatic EYES: EOMI/PERRL, conjunctiva pink ENMT: Mucous membranes moist NECK: supple no meningeal signs SPINE/BACK:entire spine nontender CV: S1/S2 noted, no murmurs/rubs/gallops noted LUNGS: Lungs are clear to auscultation bilaterally, no apparent  distress ABDOMEN: soft, nontender, no rebound or guarding, bowel sounds noted throughout abdomen Rectal - stool color brown.  No melena.  No stool impaction. No mass noted.  External hemorrhoids noted Chaperone nurse Ginger present for exam GU:no cva tenderness NEURO: Pt is awake/alert/appropriate, moves all extremitiesx4.  No facial droop.   EXTREMITIES: pulses normal/equal, full ROM SKIN: warm, color normal PSYCH: no abnormalities of mood noted, alert and oriented to situation  ED Course  Procedures  Labs Review Labs Reviewed  URINALYSIS, ROUTINE W REFLEX MICROSCOPIC (NOT AT [facility]) - Abnormal; Notable for the following:    APPearance HAZY (*)    Hgb urine dipstick LARGE (*)    Protein, ur TRACE (*)    Leukocytes, UA TRACE (*)    All other components within normal limits  URINE MICROSCOPIC-ADD ON - Abnormal; Notable for the following:    Squamous Epithelial / LPF 0-5 (*)    Bacteria, UA MANY (*)    Casts GRANULAR CAST (*)    All other components within normal limits  POC OCCULT BLOOD, ED - Abnormal; Notable for the following:    Fecal Occult Bld POSITIVE (*)    All other components within normal limits  I-STAT CHEM 8, ED - Abnormal; Notable for the following:    Glucose, Bld 117 (*)    All other components within normal limits  POC URINE PREG, ED    I have personally reviewed and evaluated these  lab results as part of my medical decision-making.  Pt well appearing No signs of acute GI bleed She admits to chronic abdominal pain/constipation and rectal bleeding for "years" She is clinically appropriate and stable for d/c home She has been given referral info to GI clinic   MDM   Final diagnoses:  Generalized abdominal pain  Rectal bleeding    Nursing notes including past medical history and social history reviewed and considered in documentation Labs/vital reviewed myself and considered during evaluation     Ripley Fraise, MD 08/14/15 [phone]

## 2015-08-14 NOTE — ED Notes (Signed)
Pt brought in by EMS for constipation (states it has been "months") and rectal bleeding. Also states she just started her period for "first time in months".

## 2016-08-11 ENCOUNTER — Ambulatory Visit (HOSPITAL_COMMUNITY)
Admission: RE | Admit: 2016-08-11 | Discharge: 2016-08-11 | Disposition: A | Discharge status: Home / Self Care | Payer: Medicare Other | Source: Ambulatory Visit | Attending: Family Medicine | Admitting: Family Medicine

## 2016-08-11 ENCOUNTER — Other Ambulatory Visit (HOSPITAL_COMMUNITY): Payer: Self-pay | Admitting: Family Medicine

## 2016-08-11 DIAGNOSIS — G8929 Other chronic pain: Secondary | ICD-10-CM | POA: Diagnosis present

## 2016-08-11 DIAGNOSIS — R52 Pain, unspecified: Secondary | ICD-10-CM

## 2016-08-11 DIAGNOSIS — M25512 Pain in left shoulder: Secondary | ICD-10-CM | POA: Insufficient documentation

## 2016-08-11 DIAGNOSIS — M19011 Primary osteoarthritis, right shoulder: Secondary | ICD-10-CM | POA: Diagnosis not present

## 2016-08-11 DIAGNOSIS — M25511 Pain in right shoulder: Secondary | ICD-10-CM | POA: Insufficient documentation

## 2016-11-19 ENCOUNTER — Ambulatory Visit (INDEPENDENT_AMBULATORY_CARE_PROVIDER_SITE_OTHER): Payer: Medicare Other | Admitting: Adult Health

## 2016-11-19 ENCOUNTER — Encounter (INDEPENDENT_AMBULATORY_CARE_PROVIDER_SITE_OTHER): Payer: Self-pay

## 2016-11-19 ENCOUNTER — Encounter: Payer: Self-pay | Admitting: Adult Health

## 2016-11-19 VITALS — BP 136/80 | HR 88 | Resp 14 | Ht 66.75 in | Wt 256.2 lb

## 2016-11-19 DIAGNOSIS — N950 Postmenopausal bleeding: Secondary | ICD-10-CM

## 2016-11-19 NOTE — Progress Notes (Signed)
Subjective:     Patient ID: Autumn [name], female   DOB: 09/15/1965, 51 y.o.   MRN: [id number]  HPI Jaidee is a 51 year old black female, in complaining of having a period 11/01/16 after not having one for over a year.She also says it has been a long time since she had a pap smear. PCP is Dr Cindie Laroche.  Review of Systems +vaginal bleeding like a period, when not had one in a period Reviewed past medical,surgical, social and family history. Reviewed medications and allergies.     Objective:   Physical Exam BP 136/80 (BP Location: Left Arm, Patient Position: Sitting, Cuff Size: Large)   Pulse 88   Resp 14   Ht 5' 6.75" (1.695 m)   Wt 256 lb 3.2 oz (116.2 kg)   LMP 11/11/2016 Comment: first period in over 1 year  SpO2 100%   BMI 40.43 kg/m Skin warm and dry. Neck: mid line trachea, normal thyroid, good ROM, no lymphadenopathy noted. Lungs: clear to ausculation bilaterally. Cardiovascular: regular rate and rhythm.   PHQ 2 score 1. Will get [place] to assess uterus, and also appt for pap and physical, and schedule mammogram, she says that has been long time too.  Assessment:     PMB    Plan:     Return in 1 week for GYN [place] and then see me 2-3 days later for pap and physical  Review handout on PMB

## 2016-11-19 NOTE — Patient Instructions (Signed)

## 2016-11-26 ENCOUNTER — Ambulatory Visit (INDEPENDENT_AMBULATORY_CARE_PROVIDER_SITE_OTHER): Payer: Medicare Other

## 2016-11-26 DIAGNOSIS — N950 Postmenopausal bleeding: Secondary | ICD-10-CM | POA: Diagnosis not present

## 2016-11-26 NOTE — Progress Notes (Signed)
PELVIC US TA/TV:enlarged heterogeneous uterus w/mult.fibroids,(#1) fundal subserosal fibroid 4.6 x 3.4 x 3.1 cm,(#2) anterior subserosal fibroid 4 x 2.9 x 4.4 cm,EEC 17 mm,normal ovaries bilat,limited view because of fibroids,no free fluid

## 2016-11-30 ENCOUNTER — Other Ambulatory Visit: Payer: Medicare Other | Admitting: Adult Health

## 2016-12-02 ENCOUNTER — Encounter: Payer: Self-pay | Admitting: Adult Health

## 2016-12-02 ENCOUNTER — Telehealth: Payer: Self-pay | Admitting: Adult Health

## 2016-12-02 DIAGNOSIS — R9389 Abnormal findings on diagnostic imaging of other specified body structures: Secondary | ICD-10-CM | POA: Insufficient documentation

## 2016-12-02 DIAGNOSIS — D250 Submucous leiomyoma of uterus: Secondary | ICD-10-CM

## 2016-12-02 HISTORY — DX: Abnormal findings on diagnostic imaging of other specified body structures: R93.89

## 2016-12-02 HISTORY — DX: Submucous leiomyoma of uterus: D25.0

## 2016-12-02 NOTE — Telephone Encounter (Signed)
Aware that US showed fibroids and thickened endometrium at 7 mm will need endometrial biopsy will make appt, with Dr Elonda Husky

## 2016-12-15 ENCOUNTER — Other Ambulatory Visit: Payer: Medicaid Other | Admitting: Obstetrics & Gynecology

## 2016-12-29 ENCOUNTER — Other Ambulatory Visit: Payer: Medicaid Other | Admitting: Obstetrics & Gynecology

## 2016-12-30 ENCOUNTER — Other Ambulatory Visit: Payer: Medicaid Other | Admitting: Adult Health

## 2017-01-06 ENCOUNTER — Other Ambulatory Visit: Payer: Medicaid Other | Admitting: Adult Health

## 2017-01-07 ENCOUNTER — Other Ambulatory Visit: Payer: Medicaid Other | Admitting: Obstetrics & Gynecology

## 2017-02-04 ENCOUNTER — Ambulatory Visit (INDEPENDENT_AMBULATORY_CARE_PROVIDER_SITE_OTHER): Payer: 59 | Admitting: Obstetrics & Gynecology

## 2017-02-04 ENCOUNTER — Other Ambulatory Visit: Payer: Self-pay | Admitting: Obstetrics & Gynecology

## 2017-02-04 ENCOUNTER — Encounter: Payer: Self-pay | Admitting: Obstetrics & Gynecology

## 2017-02-04 VITALS — BP 148/80 | HR 80 | Ht 66.0 in | Wt 240.0 lb

## 2017-02-04 DIAGNOSIS — Z3202 Encounter for pregnancy test, result negative: Secondary | ICD-10-CM | POA: Diagnosis not present

## 2017-02-04 DIAGNOSIS — N950 Postmenopausal bleeding: Secondary | ICD-10-CM | POA: Diagnosis not present

## 2017-02-04 DIAGNOSIS — R9389 Abnormal findings on diagnostic imaging of other specified body structures: Secondary | ICD-10-CM | POA: Diagnosis not present

## 2017-02-04 LAB — POCT URINE PREGNANCY: PREG TEST UR: NEGATIVE

## 2017-02-04 NOTE — Progress Notes (Signed)
Endometrial Biopsy Procedure Note  Pre-operative Diagnosis: thickened endomtrium in woman with post menopausa bleeding  Post-operative Diagnosis: same  Indications: postmenopausal bleeding  Procedure Details   Urine pregnancy test was not done.  The risks (including infection, bleeding, pain, and uterine perforation) and benefits of the procedure were explained to the patient and Written informed consent was obtained.  Antibiotic prophylaxis against endocarditis was not indicated.   The patient was placed in the dorsal lithotomy position.  Bimanual exam showed the uterus to be in the anteroflexed position.  A Graves' speculum inserted in the vagina, and the cervix prepped with povidone iodine.  Endocervical curettage with a Kevorkian curette was not performed.   A sharp tenaculum was applied to the anterior lip of the cervix for stabilization.  A sterile uterine sound was used to sound the uterus to a depth of 7.5cm.  A Mylex 26mm curette was used to sample the endometrium.  Sample was sent for pathologic examination.  Condition: Stable  Complications: None  Plan:  The patient was advised to call for any fever or for prolonged or severe pain or bleeding. She was advised to use OTC acetaminophen and OTC ibuprofen as needed for mild to moderate pain. She was advised to avoid vaginal intercourse for 48 hours or until the bleeding has completely stopped.  Attending Physician Documentation: I was present for or performed the following: endometrial biopsy  Follow up 1 week to go over the biopsy results

## 2017-02-05 ENCOUNTER — Other Ambulatory Visit: Payer: Medicaid Other | Admitting: Adult Health

## 2017-02-12 ENCOUNTER — Ambulatory Visit: Payer: 59 | Admitting: Obstetrics & Gynecology

## 2017-02-16 ENCOUNTER — Encounter: Payer: Self-pay | Admitting: Obstetrics & Gynecology

## 2017-02-16 ENCOUNTER — Ambulatory Visit (INDEPENDENT_AMBULATORY_CARE_PROVIDER_SITE_OTHER): Payer: 59 | Admitting: Obstetrics & Gynecology

## 2017-02-16 VITALS — BP 140/90 | HR 98 | Ht 66.0 in | Wt 259.0 lb

## 2017-02-16 DIAGNOSIS — R9389 Abnormal findings on diagnostic imaging of other specified body structures: Secondary | ICD-10-CM

## 2017-02-16 DIAGNOSIS — N950 Postmenopausal bleeding: Secondary | ICD-10-CM

## 2017-02-16 MED ORDER — MEDROXYPROGESTERONE ACETATE 10 MG PO TABS: 10.0000 mg | ORAL_TABLET | Freq: Every day | ORAL | 11 refills | Status: DC

## 2017-02-16 NOTE — Progress Notes (Signed)
Follow up appointment for results  Chief Complaint  Patient presents with  . Follow-up    biopsy result    Blood pressure 140/90, pulse 98, height 5\' 6"  (1.676 m), weight 259 lb (117.5 kg).  Atrophic endometrium without hyperplasia    MEDS ordered this encounter: Meds ordered this encounter  Medications  . medroxyPROGESTERone (PROVERA) 10 MG tablet    Sig: Take 1 tablet (10 mg total) by mouth daily.    Dispense:  30 tablet    Refill:  11    Orders for this encounter: No orders of the defined types were placed in this encounter.   Impression:  PMB (postmenopausal bleeding)  Thickened endometrium   Plan: Cyclical provera t provide ongoing endometrial suppression  Follow Up: Return in about 6 months (around 08/16/2017) for Follow up, with Dr Elonda Husky.       Face to face time:  10 minutes  Greater than 50% of the visit time was spent in counseling and coordination of care with the patient.  The summary and outline of the counseling and care coordination is summarized in the note above.   All questions were answered.  Past Medical History:  Diagnosis Date  . Anxiety    Panic attacks- "cries"  . Arthritis   . Complication of anesthesia    had some hallucations  . Fibroids, submucosal 12/02/2016  . GERD (gastroesophageal reflux disease)   . H/O colonoscopy   . Hypertension   . Rectal bleeding   . Shortness of breath dyspnea    with exertion  . Thickened endometrium 12/02/2016    Past Surgical History:  Procedure Laterality Date  . BREAST LUMPECTOMY Right   . CESAREAN SECTION     x3  . COLONOSCOPY    . DENTAL SURGERY    . MULTIPLE EXTRACTIONS WITH ALVEOLOPLASTY Bilateral 04/12/2015   Procedure: BILATERAL MULTIPLE EXTRACTIONS NUMBERS TWENTY, TWENTY ONE, TWENTY TWO, TWENTY THREE, TWENTY FOUR, TWENTY FIVE, TWENTY EIGHT, TWENTY NINE WITH ALVEOLOPLASTY;  Surgeon: Diona Browner, DDS;  Location: [place];  Service: Oral Surgery;  Laterality: Bilateral;  . TUBAL  LIGATION      OB History    Gravida  3   Para  3   Term  3   Preterm      AB      Living  3     SAB      TAB      Ectopic      Multiple      Live Births           Obstetric Comments  Unsure of middle childs dob, child was adopted.         Allergies  Allergen Reactions  . Bee Venom Anaphylaxis  . Tramadol Hives  . Diona Fanti [Aspirin]     Social History   Socioeconomic History  . Marital status: Single    Spouse name: Not on file  . Number of children: Not on file  . Years of education: Not on file  . Highest education level: Not on file  Occupational History  . Not on file  Social Needs  . Financial resource strain: Not on file  . Food insecurity:    Worry: Not on file    Inability: Not on file  . Transportation needs:    Medical: Not on file    Non-medical: Not on file  Tobacco Use  . Smoking status: Current Every Day Smoker    Packs/day: 0.50    Years: 31.00  Pack years: 15.50    Types: Cigarettes  . Smokeless tobacco: Never Used  Substance and Sexual Activity  . Alcohol use: No  . Drug use: No  . Sexual activity: Not Currently    Birth control/protection: Surgical  Lifestyle  . Physical activity:    Days per week: Not on file    Minutes per session: Not on file  . Stress: Not on file  Relationships  . Social connections:    Talks on phone: Not on file    Gets together: Not on file    Attends religious service: Not on file    Active member of club or organization: Not on file    Attends meetings of clubs or organizations: Not on file    Relationship status: Not on file  Other Topics Concern  . Not on file  Social History Narrative  . Not on file    Family History  Adopted: Yes  Problem Relation Age of Onset  . Heart disease Unknown

## 2017-09-01 NOTE — Congregational Nurse Program (Signed)
Congregational Nurse Program Note  Date of Encounter: 09/01/2017  Past Medical History: Past Medical History:  Diagnosis Date  . Anxiety    Panic attacks- "cries"  . Arthritis   . Complication of anesthesia    had some hallucations  . Fibroids, submucosal 12/02/2016  . GERD (gastroesophageal reflux disease)   . H/O colonoscopy   . Hypertension   . Rectal bleeding   . Shortness of breath dyspnea    with exertion  . Thickened endometrium 12/02/2016    Encounter Details: CNP Questionnaire - 09/01/17 1146      Questionnaire   Patient Status  Not Applicable    Race  Black or African American    Location Patient Served At  [employer] [employer]  Not Applicable    Uninsured  Uninsured (NEW 1x/quarter)    Food  No food insecurities    Housing/Utilities  Yes, have permanent housing    Transportation  No transportation needs    Interpersonal Safety  Yes, feel physically and emotionally safe where you currently live    Medication  No medication insecurities    Medical Provider  Yes    Referrals  Not Applicable    ED Visit Averted  Not Applicable    Life-Saving Intervention Made  Not Applicable      Seen at the [employer]. No problems [address] [place], [place] Program [phone]

## 2017-11-19 ENCOUNTER — Other Ambulatory Visit (HOSPITAL_COMMUNITY)
Admission: RE | Admit: 2017-11-19 | Discharge: 2017-11-19 | Disposition: A | Discharge status: Home / Self Care | Payer: 59 | Source: Ambulatory Visit | Attending: Family Medicine | Admitting: Family Medicine

## 2017-11-19 DIAGNOSIS — E559 Vitamin D deficiency, unspecified: Secondary | ICD-10-CM | POA: Diagnosis present

## 2017-11-19 DIAGNOSIS — R5383 Other fatigue: Secondary | ICD-10-CM | POA: Diagnosis present

## 2017-11-19 DIAGNOSIS — I10 Essential (primary) hypertension: Secondary | ICD-10-CM | POA: Diagnosis present

## 2017-11-19 DIAGNOSIS — D510 Vitamin B12 deficiency anemia due to intrinsic factor deficiency: Secondary | ICD-10-CM | POA: Diagnosis present

## 2017-11-19 DIAGNOSIS — E7849 Other hyperlipidemia: Secondary | ICD-10-CM | POA: Insufficient documentation

## 2017-11-19 LAB — LIPID PANEL
Cholesterol: 199 mg/dL (ref 0–200)
HDL: 50 mg/dL (ref >40)
LDL Cholesterol: 113 mg/dL — ABNORMAL HIGH (ref 0–99)
Total CHOL/HDL Ratio: 4 RATIO
Triglycerides: 178 mg/dL — ABNORMAL HIGH (ref <150)
VLDL: 36 mg/dL (ref 0–40)

## 2017-11-19 LAB — CBC
HCT: 45.1 % (ref 36.0–46.0)
Hemoglobin: 13.6 g/dL (ref 12.0–15.0)
MCH: 22.6 pg — ABNORMAL LOW (ref 26.0–34.0)
MCHC: 30.2 g/dL (ref 30.0–36.0)
MCV: 74.9 fL — ABNORMAL LOW (ref 80.0–100.0)
Platelets: 415 10*3/uL — ABNORMAL HIGH (ref 150–400)
RBC: 6.02 MIL/uL — ABNORMAL HIGH (ref 3.87–5.11)
RDW: 15.2 % (ref 11.5–15.5)
WBC: 6.6 10*3/uL (ref 4.0–10.5)
nRBC: 0 % (ref 0.0–0.2)

## 2017-11-19 LAB — COMPREHENSIVE METABOLIC PANEL
ALT: 26 U/L (ref 0–44)
AST: 29 U/L (ref 15–41)
Albumin: 4.3 g/dL (ref 3.5–5.0)
Alkaline Phosphatase: 96 U/L (ref 38–126)
Anion gap: 9 (ref 5–15)
BUN: 8 mg/dL (ref 6–20)
CO2: 25 mmol/L (ref 22–32)
Calcium: 9.7 mg/dL (ref 8.9–10.3)
Chloride: 103 mmol/L (ref 98–111)
Creatinine, Ser: 0.77 mg/dL (ref 0.44–1.00)
GFR calc Af Amer: >60 mL/min (ref >60)
GFR calc non Af Amer: >60 mL/min (ref >60)
Glucose, Bld: 98 mg/dL (ref 70–99)
Potassium: 4.2 mmol/L (ref 3.5–5.1)
Sodium: 137 mmol/L (ref 135–145)
Total Bilirubin: 0.6 mg/dL (ref 0.3–1.2)
Total Protein: 8.3 g/dL — ABNORMAL HIGH (ref 6.5–8.1)

## 2017-11-19 LAB — TSH: TSH: 1.088 u[IU]/mL (ref 0.350–4.500)

## 2017-11-22 LAB — CALCITRIOL (1,25 DI-OH VIT D): Vit D, 1,25-Dihydroxy: 72.3 pg/mL (ref 19.9–79.3)

## 2017-11-30 ENCOUNTER — Ambulatory Visit (INDEPENDENT_AMBULATORY_CARE_PROVIDER_SITE_OTHER): Payer: 59 | Admitting: General Surgery

## 2017-11-30 ENCOUNTER — Encounter: Payer: Self-pay | Admitting: General Surgery

## 2017-11-30 VITALS — BP 168/90 | HR 92 | Temp 98.0°F | Resp 18 | Wt 248.0 lb

## 2017-11-30 DIAGNOSIS — L723 Sebaceous cyst: Secondary | ICD-10-CM

## 2017-11-30 NOTE — Progress Notes (Signed)
[facility] History and Physical  Reason for Referral: Abscess on face  Referring Physician:  Dr. Cindie Laroche   Chief Complaint    Cyst      Autumn [name] is a 52 y.o. female.  HPI: Autumn [name] is a 39 yo who has recently lost her boyfriend 1 month ago, who comes in with complaints of an area on her left cheek that has been there for 6 months. The area has become larger and swollen at times and has drained. She has squeezed out the contents of the mass multiple times, and has noticed that it returns. She reports having some redness around the area when it gets enlarged. She reports having a similar area on the right cheek that was incised and drained in an urgent care office. She has not had other issues with that prior area.  She says that this area on the left cheek is making her uncomfortable and is bothering her.    Past Medical History:  Diagnosis Date  . Anxiety    Panic attacks- "cries"  . Arthritis   . Complication of anesthesia    had some hallucations  . Fibroids, submucosal 12/02/2016  . GERD (gastroesophageal reflux disease)   . H/O colonoscopy   . Hypertension   . Rectal bleeding   . Shortness of breath dyspnea    with exertion  . Thickened endometrium 12/02/2016    Past Surgical History:  Procedure Laterality Date  . BREAST LUMPECTOMY Right   . CESAREAN SECTION     x3  . COLONOSCOPY    . DENTAL SURGERY    . MULTIPLE EXTRACTIONS WITH ALVEOLOPLASTY Bilateral 04/12/2015   Procedure: BILATERAL MULTIPLE EXTRACTIONS NUMBERS TWENTY, TWENTY ONE, TWENTY TWO, TWENTY THREE, TWENTY FOUR, TWENTY FIVE, TWENTY EIGHT, TWENTY NINE WITH ALVEOLOPLASTY;  Surgeon: Diona Browner, DDS;  Location: [place];  Service: Oral Surgery;  Laterality: Bilateral;  . TUBAL LIGATION      Family History  Adopted: Yes  Problem Relation Age of Onset  . Heart disease Unknown     Social History   Tobacco Use  . Smoking status: Current Every Day Smoker    Packs/day:  0.50    Years: 31.00    Pack years: 15.50    Types: Cigarettes  . Smokeless tobacco: Never Used  Substance Use Topics  . Alcohol use: No  . Drug use: No    Medications: I have reviewed the patient's current medications. Allergies as of 11/30/2017      Reactions   Bee Venom Anaphylaxis   Tramadol Hives   Asa [aspirin]       Medication List        Accurate as of 11/30/17  1:07 PM. Always use your most recent med list.          naproxen 250 MG tablet Commonly known as:  NAPROSYN Take 250 mg by mouth 2 (two) times daily with a meal.        ROS:  A comprehensive review of systems was negative except for: Neurological: positive for headaches; Eye pain  Blood pressure (!) 168/90, pulse 92, temperature 98 F (36.7 C), temperature source Temporal, resp. rate 18, weight 248 lb (112.5 kg). Physical Exam  Constitutional: She is oriented to person, place, and time. She appears well-developed and well-nourished.  HENT:  Head: Normocephalic and atraumatic.  1 cm, fluid filled, spongy area on face/ left cheek, mobile  Eyes: Pupils are equal, round, and reactive to light. EOM are normal.  Neck:  Normal range of motion.  Cardiovascular: Normal rate and regular rhythm.  Pulmonary/Chest: Effort normal and breath sounds normal.  Abdominal: Soft. She exhibits no distension. There is no tenderness.  Musculoskeletal: Normal range of motion. She exhibits no edema.  Neurological: She is alert and oriented to person, place, and time.  Skin: Skin is warm and dry.  Psychiatric: She has a normal mood and affect. Her behavior is normal. Judgment and thought content normal.  Vitals reviewed.   Results: None   Assessment & Plan:  Autumn [name] is a 52 y.o. female with what is likely a sebaceous / dermoid cyst on the left cheek. She has had a prior one on the right that was incised at some point. She continues to have issue with it draining and getting large and infected.     -Excision of the cyst from face  -Patient wants to have some sedation with this given the location   All questions were answered to the satisfaction of the patient.  The risk and benefits of excision of cyst from face were discussed including but not limited to bleeding, infection, recurrence, scar on face.  After careful consideration, Autumn [name] has decided to proceed.    Virl Cagey 11/30/2017, 1:07 PM

## 2017-11-30 NOTE — H&P (Signed)
[facility] History and Physical  Reason for Referral: Abscess on face  Referring Physician:  Dr. Cindie Laroche      Chief Complaint    Cyst      Autumn [name] is a 52 y.o. female.  HPI: Autumn [name] is a 67 yo who has recently lost her boyfriend 1 month ago, who comes in with complaints of an area on her left cheek that has been there for 6 months. The area has become larger and swollen at times and has drained. She has squeezed out the contents of the mass multiple times, and has noticed that it returns. She reports having some redness around the area when it gets enlarged. She reports having a similar area on the right cheek that was incised and drained in an urgent care office. She has not had other issues with that prior area.  She says that this area on the left cheek is making her uncomfortable and is bothering her.        Past Medical History:  Diagnosis Date  . Anxiety    Panic attacks- "cries"  . Arthritis   . Complication of anesthesia    had some hallucations  . Fibroids, submucosal 12/02/2016  . GERD (gastroesophageal reflux disease)   . H/O colonoscopy   . Hypertension   . Rectal bleeding   . Shortness of breath dyspnea    with exertion  . Thickened endometrium 12/02/2016         Past Surgical History:  Procedure Laterality Date  . BREAST LUMPECTOMY Right   . CESAREAN SECTION     x3  . COLONOSCOPY    . DENTAL SURGERY    . MULTIPLE EXTRACTIONS WITH ALVEOLOPLASTY Bilateral 04/12/2015   Procedure: BILATERAL MULTIPLE EXTRACTIONS NUMBERS TWENTY, TWENTY ONE, TWENTY TWO, TWENTY THREE, TWENTY FOUR, TWENTY FIVE, TWENTY EIGHT, TWENTY NINE WITH ALVEOLOPLASTY;  Surgeon: Diona Browner, DDS;  Location: [facility];  Service: Oral Surgery;  Laterality: Bilateral;  . TUBAL LIGATION           Family History  Adopted: Yes  Problem Relation Age of Onset  . Heart disease Unknown     Social History        Tobacco  Use  . Smoking status: Current Every Day Smoker    Packs/day: 0.50    Years: 31.00    Pack years: 15.50    Types: Cigarettes  . Smokeless tobacco: Never Used  Substance Use Topics  . Alcohol use: No  . Drug use: No    Medications: I have reviewed the patient's current medications.      Allergies as of 11/30/2017      Reactions   Bee Venom Anaphylaxis   Tramadol Hives   Asa [aspirin]                Medication List            Accurate as of 11/30/17  1:07 PM. Always use your most recent med list.           naproxen 250 MG tablet Commonly known as:  NAPROSYN Take 250 mg by mouth 2 (two) times daily with a meal.        ROS:  A comprehensive review of systems was negative except for: Neurological: positive for headaches; Eye pain  Blood pressure (!) 168/90, pulse 92, temperature 98 F (36.7 C), temperature source Temporal, resp. rate 18, weight 248 lb (112.5 kg). Physical Exam  Constitutional: She is oriented to person, place,  and time. She appears well-developed and well-nourished.  HENT:  Head: Normocephalic and atraumatic.  1 cm, fluid filled, spongy area on face/ left cheek, mobile  Eyes: Pupils are equal, round, and reactive to light. EOM are normal.  Neck: Normal range of motion.  Cardiovascular: Normal rate and regular rhythm.  Pulmonary/Chest: Effort normal and breath sounds normal.  Abdominal: Soft. She exhibits no distension. There is no tenderness.  Musculoskeletal: Normal range of motion. She exhibits no edema.  Neurological: She is alert and oriented to person, place, and time.  Skin: Skin is warm and dry.  Psychiatric: She has a normal mood and affect. Her behavior is normal. Judgment and thought content normal.  Vitals reviewed.   Results: None   Assessment & Plan:  Autumn [name] is a 52 y.o. female with what is likely a sebaceous / dermoid cyst on the left cheek. She has had a prior one on the right that  was incised at some point. She continues to have issue with it draining and getting large and infected.    -Excision of the cyst from face  -Patient wants to have some sedation with this given the location   All questions were answered to the satisfaction of the patient.  The risk and benefits of excision of cyst from face were discussed including but not limited to bleeding, infection, recurrence, scar on face.  After careful consideration, Autumn [name] has decided to proceed.    Virl Cagey 11/30/2017, 1:07 PM

## 2017-11-30 NOTE — Patient Instructions (Signed)
Epidermal Cyst (Sebaceous cyst) An epidermal cyst is a small, painless lump under your skin. It may be called an epidermal inclusion cyst or an infundibular cyst. The cyst contains a grayish-white, bad-smelling substance (keratin). It is important not to pop epidermal cysts yourself. These cysts are usually harmless (benign), but they can get infected. Symptoms of infection may include:  Redness.  Inflammation.  Tenderness.  Warmth.  Fever.  A grayish-white, bad-smelling substance draining from the cyst.  Pus draining from the cyst.  Follow these instructions at home:  Take over-the-counter and prescription medicines only as told by your doctor.  If you were prescribed an antibiotic, use it as told by your doctor. Do not stop using the antibiotic even if you start to feel better.  Keep the area around your cyst clean and dry.  Wear loose, dry clothing.  Do not try to pop your cyst.  Avoid touching your cyst.  Check your cyst every day for signs of infection.  Keep all follow-up visits as told by your doctor. This is important. How is this prevented?  Wear clean, dry, clothing.  Avoid wearing tight clothing.  Keep your skin clean and dry. Shower or take baths every day.  Wash your body with a benzoyl peroxide wash when you shower or bathe. Contact a health care provider if:  Your cyst has symptoms of infection.  Your condition is not improving or is getting worse.  You have a cyst that looks different from other cysts you have had.  You have a fever. Get help right away if:  Redness spreads from the cyst into the surrounding area. This information is not intended to replace advice given to you by your health care provider. Make sure you discuss any questions you have with your health care provider. Document Released: 02/20/2004 Document Revised: 09/11/2015 Document Reviewed: 11/14/2014 Elsevier Interactive Patient Education  [employer].

## 2017-12-01 NOTE — Patient Instructions (Signed)
      ELISANDRA DESHMUKH  12/01/2017     @[email or web address] @   Your procedure is scheduled on 12/06/17.  Report to [facility] at 0930 A.M.  Call this number if you have problems the morning of surgery:  [phone]   Remember:  Do not eat or drink after midnight.  Take these medicines the morning of surgery with A SIP OF WATER none    Do not wear jewelry, make-up or nail polish.  Do not wear lotions, powders, or perfumes, or deodorant.  Do not shave 48 hours prior to surgery.  Men may shave face and neck.  Do not bring valuables to the hospital.  [facility] is not responsible for any belongings or valuables.  Contacts, dentures or bridgework may not be worn into surgery.  Leave your suitcase in the car.  After surgery it may be brought to your room.  For patients admitted to the hospital, discharge time will be determined by your treatment team.  Patients discharged the day of surgery will not be allowed to drive home.   Name and phone number of your driver:  family Special instructions:   Please read over the following fact sheets that you were given. Anesthesia Post-op Instructions and Care and Recovery After Surgery      PATIENT INSTRUCTIONS POST-ANESTHESIA  IMMEDIATELY FOLLOWING SURGERY:  Do not drive or operate machinery for the first twenty four hours after surgery.  Do not make any important decisions for twenty four hours after surgery or while taking narcotic pain medications or sedatives.  If you develop intractable nausea and vomiting or a severe headache please notify your doctor immediately.  FOLLOW-UP:  Please make an appointment with your surgeon as instructed. You do not need to follow up with anesthesia unless specifically instructed to do so.  WOUND CARE INSTRUCTIONS (if applicable):  Keep a dry clean dressing on the anesthesia/puncture wound site if there is drainage.  Once the wound has quit draining you may leave it open to air.  Generally you  should leave the bandage intact for twenty four hours unless there is drainage.  If the epidural site drains for more than 36-48 hours please call the anesthesia department.  QUESTIONS?:  Please feel free to call your physician or the hospital operator if you have any questions, and they will be happy to assist you.

## 2017-12-02 ENCOUNTER — Encounter (HOSPITAL_COMMUNITY)
Admission: RE | Admit: 2017-12-02 | Discharge: 2017-12-02 | Disposition: A | Discharge status: Home / Self Care | Payer: 59 | Source: Ambulatory Visit | Attending: General Surgery | Admitting: General Surgery

## 2017-12-02 ENCOUNTER — Encounter (HOSPITAL_COMMUNITY): Payer: Self-pay

## 2017-12-02 ENCOUNTER — Ambulatory Visit: Payer: 59 | Admitting: General Surgery

## 2017-12-02 DIAGNOSIS — D510 Vitamin B12 deficiency anemia due to intrinsic factor deficiency: Secondary | ICD-10-CM | POA: Insufficient documentation

## 2017-12-02 DIAGNOSIS — E559 Vitamin D deficiency, unspecified: Secondary | ICD-10-CM | POA: Diagnosis not present

## 2017-12-02 DIAGNOSIS — R5383 Other fatigue: Secondary | ICD-10-CM | POA: Insufficient documentation

## 2017-12-02 DIAGNOSIS — I10 Essential (primary) hypertension: Secondary | ICD-10-CM | POA: Diagnosis not present

## 2017-12-02 LAB — CBC WITH DIFFERENTIAL/PLATELET
ABS IMMATURE GRANULOCYTES: 0.02 10*3/uL (ref 0.00–0.07)
BASOS PCT: 1 %
Basophils Absolute: 0.1 10*3/uL (ref 0.0–0.1)
Eosinophils Absolute: 0.2 10*3/uL (ref 0.0–0.5)
Eosinophils Relative: 3 %
HCT: 42.1 % (ref 36.0–46.0)
HEMOGLOBIN: 12.9 g/dL (ref 12.0–15.0)
Immature Granulocytes: 0 %
LYMPHS ABS: 2.6 10*3/uL (ref 0.7–4.0)
Lymphocytes Relative: 34 %
MCH: 22.7 pg — AB (ref 26.0–34.0)
MCHC: 30.6 g/dL (ref 30.0–36.0)
MCV: 74.1 fL — ABNORMAL LOW (ref 80.0–100.0)
MONO ABS: 0.8 10*3/uL (ref 0.1–1.0)
MONOS PCT: 10 %
NEUTROS PCT: 52 %
Neutro Abs: 4 10*3/uL (ref 1.7–7.7)
PLATELETS: 382 10*3/uL (ref 150–400)
RBC: 5.68 MIL/uL — AB (ref 3.87–5.11)
RDW: 14.8 % (ref 11.5–15.5)
WBC: 7.7 10*3/uL (ref 4.0–10.5)
nRBC: 0 % (ref 0.0–0.2)

## 2017-12-02 LAB — BASIC METABOLIC PANEL
Anion gap: 10 (ref 5–15)
BUN: 10 mg/dL (ref 6–20)
CO2: 23 mmol/L (ref 22–32)
CREATININE: 0.98 mg/dL (ref 0.44–1.00)
Calcium: 9.5 mg/dL (ref 8.9–10.3)
Chloride: 106 mmol/L (ref 98–111)
Glucose, Bld: 114 mg/dL — ABNORMAL HIGH (ref 70–99)
POTASSIUM: 3.7 mmol/L (ref 3.5–5.1)
SODIUM: 139 mmol/L (ref 135–145)

## 2017-12-06 ENCOUNTER — Encounter (HOSPITAL_COMMUNITY): Payer: Self-pay

## 2017-12-06 ENCOUNTER — Encounter (HOSPITAL_COMMUNITY)
Admission: RE | Disposition: A | Discharge status: Home / Self Care | Payer: Self-pay | Source: Ambulatory Visit | Attending: General Surgery

## 2017-12-06 ENCOUNTER — Ambulatory Visit (HOSPITAL_COMMUNITY)
Admission: RE | Admit: 2017-12-06 | Discharge: 2017-12-06 | Disposition: A | Discharge status: Home / Self Care | Payer: 59 | Source: Ambulatory Visit | Attending: General Surgery | Admitting: General Surgery

## 2017-12-06 ENCOUNTER — Ambulatory Visit (HOSPITAL_COMMUNITY): Payer: 59 | Admitting: Anesthesiology

## 2017-12-06 DIAGNOSIS — L0201 Cutaneous abscess of face: Secondary | ICD-10-CM | POA: Insufficient documentation

## 2017-12-06 DIAGNOSIS — Z888 Allergy status to other drugs, medicaments and biological substances status: Secondary | ICD-10-CM | POA: Insufficient documentation

## 2017-12-06 DIAGNOSIS — Z886 Allergy status to analgesic agent status: Secondary | ICD-10-CM | POA: Insufficient documentation

## 2017-12-06 DIAGNOSIS — I10 Essential (primary) hypertension: Secondary | ICD-10-CM | POA: Insufficient documentation

## 2017-12-06 DIAGNOSIS — F17210 Nicotine dependence, cigarettes, uncomplicated: Secondary | ICD-10-CM | POA: Insufficient documentation

## 2017-12-06 DIAGNOSIS — L723 Sebaceous cyst: Secondary | ICD-10-CM | POA: Diagnosis not present

## 2017-12-06 DIAGNOSIS — Z79899 Other long term (current) drug therapy: Secondary | ICD-10-CM | POA: Diagnosis not present

## 2017-12-06 DIAGNOSIS — F419 Anxiety disorder, unspecified: Secondary | ICD-10-CM | POA: Insufficient documentation

## 2017-12-06 DIAGNOSIS — K219 Gastro-esophageal reflux disease without esophagitis: Secondary | ICD-10-CM | POA: Diagnosis not present

## 2017-12-06 DIAGNOSIS — L720 Epidermal cyst: Secondary | ICD-10-CM | POA: Diagnosis present

## 2017-12-06 HISTORY — PX: MASS EXCISION: SHX2000

## 2017-12-06 SURGERY — EXCISION MASS
Anesthesia: Monitor Anesthesia Care | Site: Face

## 2017-12-06 MED ORDER — PROPOFOL 500 MG/50ML IV EMUL
INTRAVENOUS | Status: DC | PRN
  Administered 2017-12-06: 45 ug/kg/min via INTRAVENOUS

## 2017-12-06 MED ORDER — MIDAZOLAM HCL 2 MG/2ML IJ SOLN
INTRAMUSCULAR | Status: AC
  Filled 2017-12-06: qty 2

## 2017-12-06 MED ORDER — CHLORHEXIDINE GLUCONATE CLOTH 2 % EX PADS: 6.0000 | MEDICATED_PAD | Freq: Once | CUTANEOUS | Status: DC

## 2017-12-06 MED ORDER — OXYCODONE HCL 5 MG PO TABS: 5.0000 mg | ORAL_TABLET | ORAL | 0 refills | Status: DC | PRN

## 2017-12-06 MED ORDER — LIDOCAINE HCL (PF) 1 % IJ SOLN
INTRAMUSCULAR | Status: DC | PRN
  Administered 2017-12-06: 8 mL

## 2017-12-06 MED ORDER — HYDROMORPHONE HCL 1 MG/ML IJ SOLN: 0.2500 mg | INTRAMUSCULAR | Status: DC | PRN

## 2017-12-06 MED ORDER — DOCUSATE SODIUM 100 MG PO CAPS: 100.0000 mg | ORAL_CAPSULE | Freq: Two times a day (BID) | ORAL | 2 refills | Status: DC

## 2017-12-06 MED ORDER — BUPIVACAINE HCL (PF) 0.5 % IJ SOLN
INTRAMUSCULAR | Status: AC
  Filled 2017-12-06: qty 30

## 2017-12-06 MED ORDER — HYDROCODONE-ACETAMINOPHEN 7.5-325 MG PO TABS: 1.0000 | ORAL_TABLET | Freq: Once | ORAL | Status: DC | PRN

## 2017-12-06 MED ORDER — LIDOCAINE HCL (PF) 1 % IJ SOLN
INTRAMUSCULAR | Status: AC
  Filled 2017-12-06: qty 30

## 2017-12-06 MED ORDER — PROPOFOL 10 MG/ML IV BOLUS
INTRAVENOUS | Status: AC
  Filled 2017-12-06: qty 20

## 2017-12-06 MED ORDER — FENTANYL CITRATE (PF) 100 MCG/2ML IJ SOLN
INTRAMUSCULAR | Status: DC | PRN
  Administered 2017-12-06 (×4): 25 ug via INTRAVENOUS

## 2017-12-06 MED ORDER — MEPERIDINE HCL 50 MG/ML IJ SOLN: 6.2500 mg | INTRAMUSCULAR | Status: DC | PRN

## 2017-12-06 MED ORDER — CEFAZOLIN SODIUM-DEXTROSE 2-4 GM/100ML-% IV SOLN
2.0000 g | INTRAVENOUS | Status: AC
  Administered 2017-12-06: 2 g via INTRAVENOUS
  Filled 2017-12-06: qty 100

## 2017-12-06 MED ORDER — LACTATED RINGERS IV SOLN
INTRAVENOUS | Status: DC
  Administered 2017-12-06: 10:00:00 via INTRAVENOUS

## 2017-12-06 MED ORDER — PROPOFOL 10 MG/ML IV BOLUS
INTRAVENOUS | Status: DC | PRN
  Administered 2017-12-06 (×4): 15 mg via INTRAVENOUS
  Administered 2017-12-06 (×2): 20 mg via INTRAVENOUS
  Administered 2017-12-06: 15 mg via INTRAVENOUS

## 2017-12-06 MED ORDER — LACTATED RINGERS IV SOLN: INTRAVENOUS | Status: DC

## 2017-12-06 MED ORDER — 0.9 % SODIUM CHLORIDE (POUR BTL) OPTIME
TOPICAL | Status: DC | PRN
  Administered 2017-12-06: 1000 mL

## 2017-12-06 MED ORDER — MIDAZOLAM HCL 5 MG/5ML IJ SOLN
INTRAMUSCULAR | Status: DC | PRN
  Administered 2017-12-06 (×2): 2 mg via INTRAVENOUS

## 2017-12-06 MED ORDER — PROMETHAZINE HCL 25 MG/ML IJ SOLN: 6.2500 mg | INTRAMUSCULAR | Status: DC | PRN

## 2017-12-06 SURGICAL SUPPLY — 33 items
CHLORAPREP W/TINT 10.5 ML (MISCELLANEOUS) ×3 IMPLANT
CLOSURE WOUND 1/4 X3 (GAUZE/BANDAGES/DRESSINGS) ×1
CLOTH BEACON ORANGE TIMEOUT ST (SAFETY) ×3 IMPLANT
COVER LIGHT HANDLE STERIS (MISCELLANEOUS) ×6 IMPLANT
DECANTER SPIKE VIAL GLASS SM (MISCELLANEOUS) ×6 IMPLANT
ELECT NEEDLE TIP 2.8 STRL (NEEDLE) ×3 IMPLANT
ELECT REM PT RETURN 9FT ADLT (ELECTROSURGICAL) ×3
ELECTRODE REM PT RTRN 9FT ADLT (ELECTROSURGICAL) ×1 IMPLANT
GLOVE BIO SURGEON STRL SZ 6.5 (GLOVE) ×2 IMPLANT
GLOVE BIO SURGEONS STRL SZ 6.5 (GLOVE) ×1
GLOVE BIOGEL PI IND STRL 6.5 (GLOVE) ×2 IMPLANT
GLOVE BIOGEL PI IND STRL 7.0 (GLOVE) ×1 IMPLANT
GLOVE BIOGEL PI INDICATOR 6.5 (GLOVE) ×4
GLOVE BIOGEL PI INDICATOR 7.0 (GLOVE) ×2
GLOVE SURG SS PI 6.5 STRL IVOR (GLOVE) ×3 IMPLANT
GOWN STRL REUS W/ TWL XL LVL3 (GOWN DISPOSABLE) ×1 IMPLANT
GOWN STRL REUS W/TWL LRG LVL3 (GOWN DISPOSABLE) ×3 IMPLANT
GOWN STRL REUS W/TWL XL LVL3 (GOWN DISPOSABLE) ×2
KIT TURNOVER KIT A (KITS) ×3 IMPLANT
MANIFOLD NEPTUNE II (INSTRUMENTS) ×3 IMPLANT
NEEDLE HYPO 25X1 1.5 SAFETY (NEEDLE) ×3 IMPLANT
NS IRRIG 1000ML POUR BTL (IV SOLUTION) ×3 IMPLANT
PACK MINOR (CUSTOM PROCEDURE TRAY) ×3 IMPLANT
PAD ARMBOARD 7.5X6 YLW CONV (MISCELLANEOUS) ×3 IMPLANT
PAD TELFA 3X4 1S STER (GAUZE/BANDAGES/DRESSINGS) ×3 IMPLANT
SET BASIN LINEN APH (SET/KITS/TRAYS/PACK) ×3 IMPLANT
SPONGE GAUZE 2X2 8PLY STER LF (GAUZE/BANDAGES/DRESSINGS) ×1
SPONGE GAUZE 2X2 8PLY STRL LF (GAUZE/BANDAGES/DRESSINGS) ×2 IMPLANT
STRIP CLOSURE SKIN 1/4X3 (GAUZE/BANDAGES/DRESSINGS) ×2 IMPLANT
SUT MNCRL AB 4-0 PS2 18 (SUTURE) ×3 IMPLANT
SUT VIC AB 3-0 SH 27 (SUTURE) ×2
SUT VIC AB 3-0 SH 27X BRD (SUTURE) ×1 IMPLANT
SYR CONTROL 10ML LL (SYRINGE) ×6 IMPLANT

## 2017-12-06 NOTE — Anesthesia Postprocedure Evaluation (Signed)
Anesthesia Post Note  Patient: Autumn [name]  Procedure(s) Performed: EXCISION 1CM CYST ON FACE (N/A Face)  Patient location during evaluation: PACU Anesthesia Type: MAC Level of consciousness: awake and patient cooperative Pain management: pain level controlled Vital Signs Assessment: post-procedure vital signs reviewed and stable Respiratory status: spontaneous breathing, nonlabored ventilation and respiratory function stable Cardiovascular status: blood pressure returned to baseline Postop Assessment: no apparent nausea or vomiting Anesthetic complications: no     Last Vitals:  Vitals:   12/06/17 0951  BP: (!) 165/94  Pulse: 94  Resp: 16  Temp: 36.7 C  SpO2: 96%    Last Pain:  Vitals:   12/06/17 0951  TempSrc: Oral  PainSc: 0-No pain                 Robynn Marcel J

## 2017-12-06 NOTE — Anesthesia Preprocedure Evaluation (Signed)
Anesthesia Evaluation    History of Anesthesia Complications (+) history of anesthetic complications  Airway Mallampati: II       Dental  (+) Edentulous Upper, Edentulous Lower   Pulmonary shortness of breath, Current Smoker,     + decreased breath sounds      Cardiovascular hypertension, On Medications  Rhythm:regular     Neuro/Psych PSYCHIATRIC DISORDERS Anxiety    GI/Hepatic GERD  ,  Endo/Other    Renal/GU      Musculoskeletal   Abdominal   Peds  Hematology  (+) Blood dyscrasia, anemia ,   Anesthesia Other Findings Morbid obesity Report hallucinations with previous anesthetic?  Reproductive/Obstetrics                             Anesthesia Physical Anesthesia Plan  ASA: III  Anesthesia Plan: MAC   Post-op Pain Management:    Induction:   PONV Risk Score and Plan:   Airway Management Planned:   Additional Equipment:   Intra-op Plan:   Post-operative Plan:   Informed Consent:   Plan Discussed with: Anesthesiologist  Anesthesia Plan Comments:         Anesthesia Quick Evaluation

## 2017-12-06 NOTE — Transfer of Care (Signed)
Immediate Anesthesia Transfer of Care Note  Patient: Autumn [name]  Procedure(s) Performed: EXCISION 1CM CYST ON FACE (N/A Face)  Patient Location: PACU  Anesthesia Type:MAC  Level of Consciousness: awake and patient cooperative  Airway & Oxygen Therapy: Patient Spontanous Breathing  Post-op Assessment: Report given to RN, Post -op Vital signs reviewed and stable and Patient moving all extremities  Post vital signs: Reviewed and stable  Last Vitals:  Vitals Value Taken Time  BP    Temp    Pulse    Resp    SpO2      Last Pain:  Vitals:   12/06/17 0951  TempSrc: Oral  PainSc: 0-No pain         Complications: No apparent anesthesia complications

## 2017-12-06 NOTE — Op Note (Signed)
[facility] Operative Note  12/06/17  Preoperative Diagnosis: Left face cyst    Postoperative Diagnosis: Same   Procedure(s) Performed:  Excision of 1cm cyst    Surgeon: Ria Comment C. Constance Haw, MD   Assistants: No qualified resident was available    Anesthesia: Sedation/ Local     Anesthesiologist:  Dr. Herbie Drape    Specimens: Cyst    Estimated Blood Loss: Minimal   Blood Replacement: None    Complications: None   Wound Class: Contaminated    Operative Indications: Ms. Garmany is a 52 yo with a history of cyst on her face that has been infected in the past. She has had other similar area on the right side. The cyst on the left face was recently infected and became very swollen and tender. She is able to express contents from the cyst.  After a discussion of the risk and benefits of surgery, including but not limited to bleeding, infection, recurrence, skin numbness, she opted to proceed.   Findings:1cm cyst; ruptured    Procedure: The patient was taken to the operating room and placed supine. IV sedation and local anesthetic was given to the patient Intravenous antibiotics were administered per protocol.  The left cheek was prepared and draped in the usual sterile fashion.   An elliptical incision over the cyst was made, and a hemostat was used to encircle the cyst. The cyst contents did rupture but spillage was contained. Electrocautery was used to removed the cyst from the underlying subcutaneous tissue. Deep 3-0 vicryl interrupted sutures were used to close the cavity and 4-0 Prolene interrupted closed the skin. A rolled 2X2 gauze and tegaderm were placed on the cheek.   Final inspection revealed acceptable hemostasis. All counts were correct at the end of the case. The patient was awakened from anesthesia without complication.  The patient went to the PACU in stable condition.   Curlene Labrum, MD [facility] [address] [place], [place] [address] [phone] (office)

## 2017-12-06 NOTE — Interval H&P Note (Signed)
History and Physical Interval Note:  12/06/2017 10:34 AM  Autumn [name]  has presented today for surgery, with the diagnosis of 1 cm cyst on face  The various methods of treatment have been discussed with the patient and family. After consideration of risks, benefits and other options for treatment, the patient has consented to  Procedure(s): EXCISION 1CM CYST ON FACE (N/A) as a surgical intervention .  The patient's history has been reviewed, patient examined, no change in status, stable for surgery.  I have reviewed the patient's chart and labs.  Questions were answered to the patient's satisfaction.    No changes. No questions  Virl Cagey

## 2017-12-06 NOTE — Discharge Instructions (Signed)
Discharge Instructions: Keep the dressing on until 12/09/2017 if possible. Keep the left cheek dry. If the dressing comes off or is saturated, replace with a bandaid daily.  Shower per your regular routine otherwise after 12/09/2017. You can wash the cheek and face after that day with normal soap/ water.  No lotions on the left cheek area. You can place bacitracin on the left cheek starting after 12/09/2017.  Take tylenol and ibuprofen as needed for pain control, alternating every 4-6 hours.  Take Roxicodone for breakthrough pain. Take colace for constipation related to narcotic pain medication. Do not pick at the stitches on the incision. You stitches need to be removed next Tuesday 12/14/2017.    Epidermal Cyst Removal, Care After Refer to this sheet in the next few weeks. These instructions provide you with information about caring for yourself after your procedure. Your health care provider may also give you more specific instructions. Your treatment has been planned according to current medical practices, but problems sometimes occur. Call your health care provider if you have any problems or questions after your procedure. What can I expect after the procedure? After the procedure, it is common to have:  Soreness in the area where your cyst was removed.  Tightness or itching from your skin sutures.  Follow these instructions at home:  Take medicines only as directed by your health care provider.  If you were prescribed an antibiotic medicine, finish all of it even if you start to feel better.  Use antibiotic ointment as directed by your health care provider. Follow the instructions carefully.  There are many different ways to close and cover an incision, including stitches (sutures), skin glue, and adhesive strips. Follow your health care provider's instructions about: ? Incision care. ? Bandage (dressing) changes and removal. ? Incision closure removal.  Keep the bandage  (dressing) dry until your health care provider says that it can be removed.   After your dressing is off, check your incision every day for signs of infection. Watch for: ? Redness, swelling, or pain. ? Fluid, blood, or pus.  You can return to your normal activities. Do not do anything that stretches or puts pressure on your incision.  You can return to your normal diet.  Keep all follow-up visits as directed by your health care provider. This is important. Contact a health care provider if:  You have a fever.  Your incision bleeds.  You have redness, swelling, or pain in the incision area.  You have fluid, blood, or pus coming from your incision.  Your cyst comes back after surgery. This information is not intended to replace advice given to you by your health care provider. Make sure you discuss any questions you have with your health care provider. Document Released: 02/02/2014 Document Revised: 06/20/2015 Document Reviewed: 09/27/2013 Elsevier Interactive Patient Education  2018 [place] Anesthesia, Adult, Care After These instructions provide you with information about caring for yourself after your procedure. Your health care provider may also give you more specific instructions. Your treatment has been planned according to current medical practices, but problems sometimes occur. Call your health care provider if you have any problems or questions after your procedure. What can I expect after the procedure? After the procedure, it is common to have:  Vomiting.  A sore throat.  Mental slowness.  It is common to feel:  Nauseous.  Cold or shivery.  Sleepy.  Tired.  Sore or achy, even in parts of your  body where you did not have surgery.  Follow these instructions at home: For at least 24 hours after the procedure:  Do not: ? Participate in activities where you could fall or become injured. ? Drive. ? Use heavy machinery. ? Drink  alcohol. ? Take sleeping pills or medicines that cause drowsiness. ? Make important decisions or sign legal documents. ? Take care of children on your own.  Rest. Eating and drinking  If you vomit, drink water, juice, or soup when you can drink without vomiting.  Drink enough fluid to keep your urine clear or pale yellow.  Make sure you have little or no nausea before eating solid foods.  Follow the diet recommended by your health care provider. General instructions  Have a responsible adult stay with you until you are awake and alert.  Return to your normal activities as told by your health care provider. Ask your health care provider what activities are safe for you.  Take over-the-counter and prescription medicines only as told by your health care provider.  If you smoke, do not smoke without supervision.  Keep all follow-up visits as told by your health care provider. This is important. Contact a health care provider if:  You continue to have nausea or vomiting at home, and medicines are not helpful.  You cannot drink fluids or start eating again.  You cannot urinate after 8-12 hours.  You develop a skin rash.  You have fever.  You have increasing redness at the site of your procedure. Get help right away if:  You have difficulty breathing.  You have chest pain.  You have unexpected bleeding.  You feel that you are having a life-threatening or urgent problem. This information is not intended to replace advice given to you by your health care provider. Make sure you discuss any questions you have with your health care provider. Document Released: 04/20/2000 Document Revised: 06/17/2015 Document Reviewed: 12/27/2014 Elsevier Interactive Patient Education  [employer].

## 2017-12-07 ENCOUNTER — Encounter (HOSPITAL_COMMUNITY): Payer: Self-pay | Admitting: General Surgery

## 2017-12-14 ENCOUNTER — Encounter: Payer: Self-pay | Admitting: General Surgery

## 2017-12-14 ENCOUNTER — Ambulatory Visit (INDEPENDENT_AMBULATORY_CARE_PROVIDER_SITE_OTHER): Payer: Self-pay | Admitting: General Surgery

## 2017-12-14 VITALS — BP 139/94 | HR 95 | Temp 96.9°F | Resp 20 | Wt 248.2 lb

## 2017-12-14 DIAGNOSIS — L723 Sebaceous cyst: Secondary | ICD-10-CM

## 2017-12-14 MED ORDER — OXYCODONE HCL 5 MG PO TABS: 5.0000 mg | ORAL_TABLET | ORAL | 0 refills | Status: DC | PRN

## 2017-12-14 NOTE — Patient Instructions (Signed)
Strip will fall off in a few days. Shower per your regular routine. You can put bacitracin on the area as needed.

## 2017-12-14 NOTE — Progress Notes (Signed)
[facility] Note   HPI:  52 y.o. Female presents to clinic for post-op follow-up evaluation after excision of a dermoid/ sebaceous cyst. Patient reports doing well without issues but is having pain in the area and requiring narcotics.  Review of Systems:  No fevers or chills All other review of systems: otherwise negative   Pathology: Diagnosis Cyst, excision, 1 cm cyst on face - BENIGN EPIDERMAL (INCLUSION) CYST.  Vital Signs:  BP (!) 139/94 (BP Location: Left Arm, Patient Position: Sitting, Cuff Size: Large)   Pulse 95   Temp (!) 96.9 F (36.1 C) (Temporal)   Resp 20   Wt 248 lb 3.2 oz (112.6 kg)   BMI 41.30 kg/m    Physical Exam:  Physical Exam  Constitutional: She appears well-developed.  HENT:  Head: Normocephalic.  Sutures removed from cheek, no erythema or drainage, steri applied  Eyes: Pupils are equal, round, and reactive to light.    Assessment:  52 y.o. yo Female with cyst removed. Doing well.  Plan:  - 1 last Rx for roxicodone sent in to pharmacy, also encouraged ibuprofen use   - Follow up PRN    All of the above recommendations were discussed with the patient and patient's family, and all of patient's and family's questions were answered to their expressed satisfaction.  Curlene Labrum, MD [facility] [address] [place], [place] [address] [phone] (office)

## 2018-01-11 ENCOUNTER — Other Ambulatory Visit: Payer: Self-pay

## 2018-01-11 ENCOUNTER — Emergency Department (HOSPITAL_COMMUNITY)
Admission: EM | Admit: 2018-01-11 | Discharge: 2018-01-11 | Disposition: A | Discharge status: Home / Self Care | Payer: 59 | Attending: Emergency Medicine | Admitting: Emergency Medicine

## 2018-01-11 ENCOUNTER — Encounter (HOSPITAL_COMMUNITY): Payer: Self-pay | Admitting: Emergency Medicine

## 2018-01-11 DIAGNOSIS — F17210 Nicotine dependence, cigarettes, uncomplicated: Secondary | ICD-10-CM | POA: Diagnosis not present

## 2018-01-11 DIAGNOSIS — K625 Hemorrhage of anus and rectum: Secondary | ICD-10-CM | POA: Diagnosis not present

## 2018-01-11 DIAGNOSIS — I10 Essential (primary) hypertension: Secondary | ICD-10-CM | POA: Diagnosis not present

## 2018-01-11 DIAGNOSIS — F419 Anxiety disorder, unspecified: Secondary | ICD-10-CM | POA: Diagnosis not present

## 2018-01-11 LAB — CBC
HCT: 44.8 % (ref 36.0–46.0)
HEMOGLOBIN: 13.7 g/dL (ref 12.0–15.0)
MCH: 23.1 pg — ABNORMAL LOW (ref 26.0–34.0)
MCHC: 30.6 g/dL (ref 30.0–36.0)
MCV: 75.7 fL — ABNORMAL LOW (ref 80.0–100.0)
NRBC: 0 % (ref 0.0–0.2)
Platelets: 441 10*3/uL — ABNORMAL HIGH (ref 150–400)
RBC: 5.92 MIL/uL — AB (ref 3.87–5.11)
RDW: 14.6 % (ref 11.5–15.5)
WBC: 9.9 10*3/uL (ref 4.0–10.5)

## 2018-01-11 LAB — COMPREHENSIVE METABOLIC PANEL
ALK PHOS: 96 U/L (ref 38–126)
ALT: 23 U/L (ref 0–44)
ANION GAP: 7 (ref 5–15)
AST: 24 U/L (ref 15–41)
Albumin: 4 g/dL (ref 3.5–5.0)
BUN: 13 mg/dL (ref 6–20)
CALCIUM: 9.4 mg/dL (ref 8.9–10.3)
CO2: 24 mmol/L (ref 22–32)
Chloride: 107 mmol/L (ref 98–111)
Creatinine, Ser: 0.87 mg/dL (ref 0.44–1.00)
GFR calc non Af Amer: >60 mL/min (ref >60)
Glucose, Bld: 100 mg/dL — ABNORMAL HIGH (ref 70–99)
POTASSIUM: 3.9 mmol/L (ref 3.5–5.1)
Sodium: 138 mmol/L (ref 135–145)
TOTAL PROTEIN: 8.2 g/dL — AB (ref 6.5–8.1)
Total Bilirubin: 0.4 mg/dL (ref 0.3–1.2)

## 2018-01-11 LAB — I-STAT BETA HCG BLOOD, ED (MC, WL, AP ONLY)

## 2018-01-11 NOTE — Discharge Instructions (Addendum)
Follow up with Dr. Laural Golden or Dr. Oneida Alar in 1-2 weeks for rectal bleeding

## 2018-01-11 NOTE — ED Triage Notes (Signed)
Pt states she was constipated and started taking probiotics with some relief. Pt states she has had trouble with rectal bleeding for 3-4 years and since taking the probiotics she has noticed a larger amount of blood. Pt has had colonoscopies "but they did not find anything."

## 2018-01-11 NOTE — ED Provider Notes (Signed)
www [facility] Provider Note   CSN: [id number] Arrival date & time: 01/11/18  2038     History   Chief Complaint Chief Complaint  Patient presents with  . Abdominal Pain    HPI Autumn [name] is a 52 y.o. female.  Patient complains of rectal bleeding.  Patient has a history of rectal bleeding before and has had a colonoscopy that did not show anything  The history is provided by the patient. No language interpreter was used.  Illness  This is a new problem. The current episode started more than 2 days ago. The problem occurs constantly. The problem has not changed since onset.Pertinent negatives include no chest pain, no abdominal pain and no headaches. Nothing aggravates the symptoms. Nothing relieves the symptoms. She has tried nothing for the symptoms. The treatment provided no relief.    Past Medical History:  Diagnosis Date  . Anxiety    Panic attacks- "cries"  . Arthritis   . Complication of anesthesia    had some hallucations  . Fibroids, submucosal 12/02/2016  . GERD (gastroesophageal reflux disease)   . H/O colonoscopy   . Hypertension   . Rectal bleeding   . Shortness of breath dyspnea    with exertion  . Thickened endometrium 12/02/2016    Patient Active Problem List   Diagnosis Date Noted  . Sebaceous cyst 11/30/2017  . Fibroids, submucosal 12/02/2016  . Thickened endometrium 12/02/2016  . Stiffness of joint, not elsewhere classified, ankle and foot 06/27/2012  . Pain in joint, ankle and foot 06/27/2012  . Difficulty walking 06/27/2012  . Ankle sprain 06/15/2012  . KNEE SPRAIN 09/16/2009  . ANKLE SPRAIN, RIGHT 09/16/2009  . ANEMIA 03/07/2008  . CIGARETTE SMOKER 03/07/2008  . ABDOMINAL BLOATING 03/07/2008  . ABDOMINAL PAIN, LEFT UPPER QUADRANT 03/07/2008  . ABDOMINAL PAIN, LEFT LOWER QUADRANT 03/07/2008  . CONSTIPATION, CHRONIC, HX OF 03/07/2008    Past Surgical History:  Procedure Laterality Date  . BREAST  LUMPECTOMY Right   . CESAREAN SECTION     x3  . COLONOSCOPY    . DENTAL SURGERY    . MASS EXCISION N/A 12/06/2017   Procedure: EXCISION 1CM CYST ON FACE;  Surgeon: Virl Cagey, MD;  Location: [facility];  Service: General;  Laterality: N/A;  . MULTIPLE EXTRACTIONS WITH ALVEOLOPLASTY Bilateral 04/12/2015   Procedure: BILATERAL MULTIPLE EXTRACTIONS NUMBERS TWENTY, TWENTY ONE, TWENTY TWO, TWENTY THREE, TWENTY FOUR, TWENTY FIVE, TWENTY EIGHT, TWENTY NINE WITH ALVEOLOPLASTY;  Surgeon: Diona Browner, DDS;  Location: [facility];  Service: Oral Surgery;  Laterality: Bilateral;  . TUBAL LIGATION       OB History    Gravida  3   Para  3   Term  3   Preterm      AB      Living  3     SAB      TAB      Ectopic      Multiple      Live Births           Obstetric Comments  Unsure of middle childs dob, child was adopted.          Home Medications    Prior to Admission medications   Medication Sig Start Date End Date Taking? Authorizing Provider  docusate sodium (COLACE) 100 MG capsule Take 1 capsule (100 mg total) by mouth 2 (two) times daily. 12/06/17 12/06/18 Yes Virl Cagey, MD  LORazepam (ATIVAN) 1 MG tablet Take  1 mg by mouth at bedtime as needed for anxiety.  11/05/17  Yes [provider]  naproxen (NAPROSYN) 250 MG tablet Take 250 mg by mouth 2 (two) times daily as needed for mild pain or moderate pain.    Yes [provider]  oxyCODONE (ROXICODONE) 5 MG immediate release tablet Take 1 tablet (5 mg total) by mouth every 4 (four) hours as needed. Patient not taking: Reported on 01/11/2018 12/14/17 12/14/18  Virl Cagey, MD    Family History Family History  Adopted: Yes  Problem Relation Age of Onset  . Heart disease Other     Social History Social History   Tobacco Use  . Smoking status: Current Every Day Smoker    Packs/day: 1.00    Years: 31.00    Pack years: 31.00    Types: Cigarettes  . Smokeless tobacco: Never Used    Substance Use Topics  . Alcohol use: Yes    Comment: 3 to 4 drinks per day.  . Drug use: No     Allergies   Bee venom; Tramadol; and Asa [aspirin]   Review of Systems Review of Systems  Constitutional: Negative for appetite change and fatigue.  HENT: Negative for congestion, ear discharge and sinus pressure.   Eyes: Negative for discharge.  Respiratory: Negative for cough.   Cardiovascular: Negative for chest pain.  Gastrointestinal: Negative for abdominal pain and diarrhea.  Genitourinary: Negative for frequency and hematuria.  Musculoskeletal: Negative for back pain.  Skin: Negative for rash.  Neurological: Negative for seizures and headaches.  Psychiatric/Behavioral: Negative for hallucinations.     Physical Exam Updated Vital Signs BP (!) 180/84 (BP Location: Right Arm)   Pulse (!) 114   Temp 98.1 F (36.7 C) (Oral)   Resp 16   Ht 5\' 4"  (1.626 m)   Wt 108.9 kg   SpO2 100%   BMI 41.20 kg/m   Physical Exam Constitutional:      Appearance: She is well-developed.  HENT:     Head: Normocephalic.     Nose: Nose normal.     Mouth/Throat:     Mouth: Mucous membranes are moist.  Eyes:     General: No scleral icterus.    Conjunctiva/sclera: Conjunctivae normal.  Neck:     Musculoskeletal: Neck supple.     Thyroid: No thyromegaly.  Cardiovascular:     Rate and Rhythm: Normal rate and regular rhythm.     Heart sounds: No murmur. No friction rub. No gallop.   Pulmonary:     Breath sounds: No stridor. No wheezing or rales.  Chest:     Chest wall: No tenderness.  Abdominal:     General: There is no distension.     Tenderness: There is no abdominal tenderness. There is no rebound.  Musculoskeletal: Normal range of motion.  Lymphadenopathy:     Cervical: No cervical adenopathy.  Skin:    Findings: No erythema or rash.  Neurological:     Mental Status: She is oriented to person, place, and time.     Motor: No abnormal muscle tone.     Coordination:  Coordination normal.  Psychiatric:        Behavior: Behavior normal.      ED Treatments / Results  Labs (all labs ordered are listed, but only abnormal results are displayed) Labs Reviewed  COMPREHENSIVE METABOLIC PANEL - Abnormal; Notable for the following components:      Result Value   Glucose, Bld 100 (*)    Total  Protein 8.2 (*)    All other components within normal limits  CBC - Abnormal; Notable for the following components:   RBC 5.92 (*)    MCV 75.7 (*)    MCH 23.1 (*)    Platelets 441 (*)    All other components within normal limits  I-STAT BETA HCG BLOOD, ED ([facility], [facility], [facility] ONLY)  POC OCCULT BLOOD, ED    EKG None  Radiology No results found.  Procedures Procedures (including critical care time)  Medications Ordered in ED Medications - No data to display   Initial Impression / Assessment and Plan / ED Course  I have reviewed the triage vital signs and the nursing notes.  Pertinent labs & imaging results that were available during my care of the patient were reviewed by me and considered in my medical decision making (see chart for details).     Patient with the history of rectal bleeding.  Labs unremarkable.  Patient not orthostatic.  She is referred to GI  Final Clinical Impressions(s) / ED Diagnoses   Final diagnoses:  Rectal bleeding    ED Discharge Orders    None       Milton Ferguson, MD 01/11/18 2238

## 2018-01-17 ENCOUNTER — Telehealth: Payer: Self-pay | Admitting: Gastroenterology

## 2018-01-17 ENCOUNTER — Encounter: Payer: Self-pay | Admitting: Gastroenterology

## 2018-01-17 NOTE — Telephone Encounter (Signed)
Yes. Looks like may have seen RMR in 2007.

## 2018-01-17 NOTE — Telephone Encounter (Signed)
Pt seen in the ER. Can we accept her as a new patient? 

## 2018-01-25 NOTE — Telephone Encounter (Signed)
OV made and letter mailed °

## 2018-03-01 ENCOUNTER — Other Ambulatory Visit: Payer: Self-pay

## 2018-03-01 ENCOUNTER — Telehealth: Payer: Self-pay

## 2018-03-01 ENCOUNTER — Encounter

## 2018-03-01 ENCOUNTER — Ambulatory Visit (INDEPENDENT_AMBULATORY_CARE_PROVIDER_SITE_OTHER): Payer: 59 | Admitting: Nurse Practitioner

## 2018-03-01 ENCOUNTER — Encounter: Payer: Self-pay | Admitting: Nurse Practitioner

## 2018-03-01 DIAGNOSIS — K625 Hemorrhage of anus and rectum: Secondary | ICD-10-CM

## 2018-03-01 DIAGNOSIS — R109 Unspecified abdominal pain: Secondary | ICD-10-CM | POA: Insufficient documentation

## 2018-03-01 DIAGNOSIS — K5900 Constipation, unspecified: Secondary | ICD-10-CM | POA: Diagnosis not present

## 2018-03-01 DIAGNOSIS — R1030 Lower abdominal pain, unspecified: Secondary | ICD-10-CM | POA: Diagnosis not present

## 2018-03-01 MED ORDER — CLENPIQ 10-3.5-12 MG-GM -GM/160ML PO SOLN: 1.0000 | Freq: Once | ORAL | 0 refills | Status: AC

## 2018-03-01 NOTE — Assessment & Plan Note (Signed)
Acute on chronic constipation.  She states she is had this for a number years.  She states she goes months and months without a bowel movement.  We will try Linzess at a low dose and option to increase dose or change another agent based on response.  We will also proceed with colonoscopy for rectal bleeding as per above.  Follow-up in 4 months.

## 2018-03-01 NOTE — Progress Notes (Addendum)
REVIEWED-[facility] TCS AFTER JUN 1 DUE TO COVID 19 RESTRICTIONS.  Primary Care Physician:  Lucia Gaskins, MD Primary Gastroenterologist:  Dr. Oneida Alar  Chief Complaint  Patient presents with  . Abdominal Pain    YEARS  . Rectal Bleeding    half of her life, pt doesn't have cycles and is bleeding rectally. last tcs was years ago ar [facility].    HPI:   Autumn [name] is a 53 y.o. female who presents on referral from the emergency department for rectal bleeding and abdominal pain.  The patient was in the emergency department 01/11/2018 where she complained of rectal bleeding with previous colonoscopy that was normal.  It started 2 days prior to her presentation and was ongoing.  Labs were unremarkable and vital signs are stable.  She was discharged and recommended to follow-up with GI.  Of note hemoglobin was 13.7.  No history of colonoscopy found in our system.  Today she states she has had chronic abdominal pain for multiple years.  She also notes that she has had rectal bleeding for many years and has had colonoscopies before.  She states her last colonoscopy was [facility] about 3-4 years, but not sure. She's had bleeding for a number of years. Typically stops with colonoscopy ("it washes it out.") A couple weeks after TCS will have more rectal bleeding. Has bleeding intermittently, last episode of bleeding was yesterday and before that was in December. States significant constipation and will "go months and months without a bowel movement." Has chronic constipation. Has history of hemorrhoids. Has abdominal pain located lower to mid abdomen. Pain doesn't improve after a owel movement. Feels very full. Limits her appetite. States she doesn't have a menstrual cycle since early 7s, started with hot flashes around age [age]. Has never seen a Editor, commissioning. Has had a pelvic exam at the health department "a long time ago." Denies chest pain, dyspnea, dizziness, lightheadedness, syncope, near syncope.  Denies any other upper or lower GI symptoms.  States she has a "new medicine from [place] in a while bottle with red lettering" but doesn't know what it is. States the medication she previously received had her hallucinating.  Past Medical History:  Diagnosis Date  . Anxiety    Panic attacks- "cries"  . Arthritis   . Complication of anesthesia    had some hallucations  . Fibroids, submucosal 12/02/2016  . GERD (gastroesophageal reflux disease)   . H/O colonoscopy   . Hypertension   . Rectal bleeding   . Shortness of breath dyspnea    with exertion  . Thickened endometrium 12/02/2016    Past Surgical History:  Procedure Laterality Date  . BREAST LUMPECTOMY Right   . CESAREAN SECTION     x3  . COLONOSCOPY    . DENTAL SURGERY    . MASS EXCISION N/A 12/06/2017   Procedure: EXCISION 1CM CYST ON FACE;  Surgeon: Virl Cagey, MD;  Location: [facility];  Service: General;  Laterality: N/A;  . MULTIPLE EXTRACTIONS WITH ALVEOLOPLASTY Bilateral 04/12/2015   Procedure: BILATERAL MULTIPLE EXTRACTIONS NUMBERS TWENTY, TWENTY ONE, TWENTY TWO, TWENTY THREE, TWENTY FOUR, TWENTY FIVE, TWENTY EIGHT, TWENTY NINE WITH ALVEOLOPLASTY;  Surgeon: Diona Browner, DDS;  Location: [facility];  Service: Oral Surgery;  Laterality: Bilateral;  . TUBAL LIGATION      Current Outpatient Medications  Medication Sig Dispense Refill  . bisacodyl (DULCOLAX) 5 MG EC tablet Take 5 mg by mouth 2 (two) times daily.    Marland Kitchen docusate sodium (  COLACE) 100 MG capsule Take 1 capsule (100 mg total) by mouth 2 (two) times daily. 60 capsule 2  . LORazepam (ATIVAN) 1 MG tablet Take 1 mg by mouth at bedtime as needed for anxiety.   0  . naproxen (NAPROSYN) 250 MG tablet Take 250 mg by mouth 2 (two) times daily as needed for mild pain or moderate pain.      No current facility-administered medications for this visit.     Allergies as of 03/01/2018 - Review Complete 03/01/2018  Allergen Reaction Noted  . Bee venom  Anaphylaxis 11/04/2010  . Tramadol Hives 11/04/2010  . Asa [aspirin]  08/14/2015  . Peppermint flavor  03/01/2018    Family History  Adopted: Yes  Problem Relation Age of Onset  . Heart disease Other     Social History   Socioeconomic History  . Marital status: Single    Spouse name: Not on file  . Number of children: Not on file  . Years of education: Not on file  . Highest education level: Not on file  Occupational History  . Not on file  Social Needs  . Financial resource strain: Not on file  . Food insecurity:    Worry: Not on file    Inability: Not on file  . Transportation needs:    Medical: Not on file    Non-medical: Not on file  Tobacco Use  . Smoking status: Current Every Day Smoker    Packs/day: 1.00    Years: 31.00    Pack years: 31.00    Types: Cigarettes  . Smokeless tobacco: Never Used  Substance and Sexual Activity  . Alcohol use: Yes    Comment: 3 to 4 drinks per day.  . Drug use: No  . Sexual activity: Not Currently    Birth control/protection: Surgical  Lifestyle  . Physical activity:    Days per week: Not on file    Minutes per session: Not on file  . Stress: Not on file  Relationships  . Social connections:    Talks on phone: Not on file    Gets together: Not on file    Attends religious service: Not on file    Active member of club or organization: Not on file    Attends meetings of clubs or organizations: Not on file    Relationship status: Not on file  . Intimate partner violence:    Fear of current or ex partner: Not on file    Emotionally abused: Not on file    Physically abused: Not on file    Forced sexual activity: Not on file  Other Topics Concern  . Not on file  Social History Narrative  . Not on file    Review of Systems: General: Negative for anorexia, weight loss, fever, chills, fatigue, weakness. Eyes: Negative for vision changes.  ENT: Negative for hoarseness, difficulty swallowing , nasal congestion. CV:  Negative for chest pain, angina, palpitations, dyspnea on exertion, peripheral edema.  Respiratory: Negative for dyspnea at rest, dyspnea on exertion, cough, sputum, wheezing.  GI: See history of present illness. GU:  Negative for dysuria, hematuria, urinary incontinence, urinary frequency, nocturnal urination.  MS: Negative for joint pain, low back pain.  Derm: Negative for rash or itching.  Neuro: Negative for weakness, abnormal sensation, seizure, frequent headaches, memory loss, confusion.  Psych: Negative for anxiety, depression, suicidal ideation, hallucinations.  Endo: Negative for unusual weight change.  Heme: Negative for bruising or bleeding. Allergy: Negative for rash or hives.  Physical Exam: BP (!) 148/89   Pulse 92   Temp 98.3 F (36.8 C) (Oral)   Ht 5\' 7"  (1.702 m)   Wt 254 lb 3.2 oz (115.3 kg)   HC 11" (27.9 cm)   BMI 39.81 kg/m  General:   Alert and oriented. Pleasant and cooperative. Well-nourished and well-developed.  Head:  Normocephalic and atraumatic. Eyes:  Without icterus, sclera clear and conjunctiva pink.  Ears:  Normal auditory acuity. Mouth:  No deformity or lesions, oral mucosa pink.  Throat/Neck:  Supple, without mass or thyromegaly. Cardiovascular:  S1, S2 present without murmurs appreciated. Normal pulses noted. Extremities without clubbing or edema. Respiratory:  Clear to auscultation bilaterally. No wheezes, rales, or rhonchi. No distress.  Gastrointestinal:  +BS, soft, non-tender and non-distended. No HSM noted. No guarding or rebound. No masses appreciated.  Rectal:  Deferred  Musculoskalatal:  Symmetrical without gross deformities. Normal posture. Skin:  Intact without significant lesions or rashes. Neurologic:  Alert and oriented x4;  grossly normal neurologically. Psych:  Alert and cooperative. Normal mood and affect. Heme/Lymph/Immune: No significant cervical adenopathy. No excessive bruising noted.    03/01/2018 11:05  AM   Disclaimer: This note was dictated with voice recognition software. Similar sounding words can inadvertently be transcribed and may not be corrected upon review.

## 2018-03-01 NOTE — Progress Notes (Signed)
CC'D TO PCP °

## 2018-03-01 NOTE — Assessment & Plan Note (Signed)
Patient describes persistent years on abdominal pain.  Of note she also has constipation which states she "does not go the bathroom for many months."  Abdominal pain very likely related to constipation.  She is also having rectal bleeding with known hemorrhoids.  At this point I will give her samples of Linzess 72 mcg daily.  She is to call us with a progress report 1 to 2 weeks and let us know if it is working.  If so, we can fill her medication at the pharmacy.  We can also dose titrate or change to a different agent based on clinical response.  Follow-up in 4 months.

## 2018-03-01 NOTE — Telephone Encounter (Signed)
Tried to call pt to inform of pre-op appt 05/11/18 at 12:45pm, no answer, LMOVM. Letter mailed.

## 2018-03-01 NOTE — Assessment & Plan Note (Signed)
The patient states she is had rectal bleeding for a number of years.  Is intermittent.  It typically occurs about every other month or once a month.  She states known hemorrhoids.  She has chronic constipation which is only managed with stool softeners.  We will try other constipation management as per below to see if this helps with her abdominal pain and rectal bleeding.  We will also schedule her for colonoscopy given her age (87) and rectal bleeding.  She states she is had previous colonoscopies to any pain but I cannot find these anywhere in our system.  We will request them from medical records to ensure.  Follow-up in 4 months.

## 2018-03-01 NOTE — Patient Instructions (Signed)
Your health issues we discussed today were:   Constipation: 1. I am giving you samples of Linzess 72 mcg.  Take this once a day on an empty stomach. 2. Call us in 1 to 2 weeks and let us know if it is helping your constipation and abdominal pain  Abdominal pain: 1. Given the length of constipation and the length of time you have had abdominal pain I feel your constipation is likely contributing to your pain 2. We will manage your constipation with a trial of a new medicine as stated above 3. We will schedule a colonoscopy which will allow [place] to further evaluate as well  Rectal bleeding: 1. We will schedule your colonoscopy for you to evaluate your rectal bleeding 2. I feel improving your constipation will help with your rectal bleeding 3. Further recommendations will follow your colonoscopy   Overall I recommend:  1. Return for follow-up in 4 months 2. Because you have any questions or concerns.  At [facility] we value your feedback. You may receive a survey about your visit today. Please share your experience as we strive to create trusting relationships with our patients to provide genuine, compassionate, quality care.  We appreciate your understanding and patience as we review any laboratory studies, imaging, and other diagnostic tests that are ordered as we care for you. Our office policy is 5 business days for review of these results, and any emergent or urgent results are addressed in a timely manner for your best interest. If you do not hear from our office in 1 week, please contact us.   We also encourage the use of MyChart, which contains your medical information for your review as well. If you are not enrolled in this feature, an access code is on this after visit summary for your convenience. Thank you for allowing [place] to be involved in your care.  It was great to see you today!  I hope you have a great day!!

## 2018-03-08 ENCOUNTER — Telehealth: Payer: Self-pay | Admitting: Gastroenterology

## 2018-03-08 ENCOUNTER — Other Ambulatory Visit (HOSPITAL_COMMUNITY): Payer: Self-pay | Admitting: Family Medicine

## 2018-03-08 DIAGNOSIS — K5900 Constipation, unspecified: Secondary | ICD-10-CM

## 2018-03-08 DIAGNOSIS — R1030 Lower abdominal pain, unspecified: Secondary | ICD-10-CM

## 2018-03-08 NOTE — Telephone Encounter (Signed)
Forwarding to Eric Gill, NP who saw pt in the office.  

## 2018-03-08 NOTE — Telephone Encounter (Signed)
[phone] PATIENT CALLED AND SAID LINZESS SAMPLES WORKED GOOD, WOULD LIKE A PRESCRIPTION SENT TO [facility] [place]

## 2018-03-10 ENCOUNTER — Telehealth: Payer: Self-pay | Admitting: Gastroenterology

## 2018-03-10 MED ORDER — LINACLOTIDE 72 MCG PO CAPS: 72.0000 ug | ORAL_CAPSULE | Freq: Every day | ORAL | 3 refills | Status: DC

## 2018-03-10 NOTE — Addendum Note (Signed)
Addended by: Gordy Levan, Elvena Oyer A on: 03/10/2018 03:47 PM   Modules accepted: Orders

## 2018-03-10 NOTE — Telephone Encounter (Signed)
Rx sent to pharmacy. If he has Pharmacist, community he can pickup a copay card to help out copay costs.

## 2018-03-10 NOTE — Telephone Encounter (Signed)
PT is aware we do not have any Linzess 72 mcg and I have another note to Randall Hiss to send in Rx. I will also call her if we get some samples in next few days since her Rx might require PA.

## 2018-03-10 NOTE — Telephone Encounter (Signed)
Left VM that Rx has been sent in  

## 2018-03-10 NOTE — Telephone Encounter (Signed)
Pt came by front desk asking if she could get samples of Linzess. DS is aware

## 2018-03-17 NOTE — Telephone Encounter (Signed)
I was unable to reach pt by phone, not working. Mailed letter that I have her some samples of Linzess 72 mcg at front.

## 2018-03-24 ENCOUNTER — Telehealth: Payer: Self-pay | Admitting: Gastroenterology

## 2018-03-24 NOTE — Telephone Encounter (Signed)
PT is aware her samples of Linzess are still waiting for her at the front.

## 2018-03-24 NOTE — Telephone Encounter (Signed)
Patient called and left a message about samples  [phone], please call back

## 2018-04-06 ENCOUNTER — Ambulatory Visit: Payer: 59 | Admitting: Gastroenterology

## 2018-04-20 ENCOUNTER — Other Ambulatory Visit (HOSPITAL_COMMUNITY)
Admission: RE | Admit: 2018-04-20 | Discharge: 2018-04-20 | Disposition: A | Discharge status: Home / Self Care | Payer: 59 | Source: Ambulatory Visit | Attending: Nurse Practitioner | Admitting: Nurse Practitioner

## 2018-04-20 ENCOUNTER — Other Ambulatory Visit: Payer: Self-pay

## 2018-04-20 ENCOUNTER — Telehealth: Payer: Self-pay

## 2018-04-20 DIAGNOSIS — K922 Gastrointestinal hemorrhage, unspecified: Secondary | ICD-10-CM | POA: Insufficient documentation

## 2018-04-20 DIAGNOSIS — D649 Anemia, unspecified: Secondary | ICD-10-CM

## 2018-04-20 LAB — CBC WITH DIFFERENTIAL/PLATELET
ABS IMMATURE GRANULOCYTES: 0.01 10*3/uL (ref 0.00–0.07)
Basophils Absolute: 0.1 10*3/uL (ref 0.0–0.1)
Basophils Relative: 1 %
Eosinophils Absolute: 0.3 10*3/uL (ref 0.0–0.5)
Eosinophils Relative: 4 %
HCT: 40.4 % (ref 36.0–46.0)
Hemoglobin: 12.5 g/dL (ref 12.0–15.0)
Immature Granulocytes: 0 %
Lymphocytes Relative: 38 %
Lymphs Abs: 2.7 10*3/uL (ref 0.7–4.0)
MCH: 23.3 pg — ABNORMAL LOW (ref 26.0–34.0)
MCHC: 30.9 g/dL (ref 30.0–36.0)
MCV: 75.4 fL — ABNORMAL LOW (ref 80.0–100.0)
Monocytes Absolute: 0.9 10*3/uL (ref 0.1–1.0)
Monocytes Relative: 13 %
NEUTROS ABS: 3.1 10*3/uL (ref 1.7–7.7)
NEUTROS PCT: 44 %
PLATELETS: 373 10*3/uL (ref 150–400)
RBC: 5.36 MIL/uL — ABNORMAL HIGH (ref 3.87–5.11)
RDW: 14.6 % (ref 11.5–15.5)
WBC: 7.2 10*3/uL (ref 4.0–10.5)
nRBC: 0 % (ref 0.0–0.2)

## 2018-04-20 NOTE — Telephone Encounter (Signed)
Let's do a stat CBC (I'll put the order in). If normal, we can continue to monitor. Further recommendations to follow.  If severe ongoing bleeding, proceed to the ER.

## 2018-04-20 NOTE — Telephone Encounter (Signed)
Call back number is [phone].

## 2018-04-20 NOTE — Telephone Encounter (Signed)
Pt called office, she is scheduled for TCS w/Propofol w/SLF 05/17/18. She is having more rectal bleeding. Blood is dark red. She can sit on toilet and blood will drip without having any BM. Lower abdominal pain at times. No constipation, Linzess 50mcg is helping.

## 2018-04-20 NOTE — Telephone Encounter (Signed)
Called and informed pt, she doesn't want to go to [facility] because they will charge her $10. She rather go to [facility] lab. Lab order faxed to [facility] lab. Advised her to have lab work done as soon as she can.

## 2018-04-27 ENCOUNTER — Telehealth: Payer: Self-pay

## 2018-04-27 DIAGNOSIS — K5900 Constipation, unspecified: Secondary | ICD-10-CM

## 2018-04-27 DIAGNOSIS — R1030 Lower abdominal pain, unspecified: Secondary | ICD-10-CM

## 2018-04-27 MED ORDER — LINACLOTIDE 72 MCG PO CAPS: 72.0000 ug | ORAL_CAPSULE | Freq: Every day | ORAL | 11 refills | Status: DC

## 2018-04-27 NOTE — Telephone Encounter (Signed)
Pt called office requesting Linzess 35mcg samples. No samples available at this time. [facility] [place].

## 2018-04-27 NOTE — Telephone Encounter (Signed)
rx done

## 2018-05-03 ENCOUNTER — Telehealth: Payer: Self-pay

## 2018-05-03 NOTE — Telephone Encounter (Signed)
Pt called office, informed her TCS will need to rescheduled. Will call her when [facility]'s July schedule is available.

## 2018-05-03 NOTE — Telephone Encounter (Signed)
Per [facility]: REVIEWED-[facility] TCS AFTER JUN 1 DUE TO COVID 19 RESTRICTIONS.  Tried to call pt at [phone], unable to complete call d/t calling restrictions. Tried to call [phone] (number for boyfriend per DPR), no answer, LMOVM for return call.

## 2018-05-04 NOTE — Telephone Encounter (Signed)
Called pt, TCS w/Propofol w/SLF rescheduled to 08/23/18 at 1:00pm. LMOVM for endo scheduler.

## 2018-05-04 NOTE — Telephone Encounter (Signed)
Pre-op appt 08/16/18 at 9:00am. Letter mailed with procedure instructions.

## 2018-05-11 ENCOUNTER — Other Ambulatory Visit (HOSPITAL_COMMUNITY): Payer: 59

## 2018-06-01 ENCOUNTER — Telehealth: Payer: Self-pay

## 2018-06-01 NOTE — Telephone Encounter (Signed)
VM received. Pt would like Linzess 72 mcg samples. Samples are ready for pickup, pt is aware.

## 2018-07-04 ENCOUNTER — Other Ambulatory Visit: Payer: Self-pay

## 2018-07-04 ENCOUNTER — Encounter: Payer: Self-pay | Admitting: Nurse Practitioner

## 2018-07-04 ENCOUNTER — Ambulatory Visit (INDEPENDENT_AMBULATORY_CARE_PROVIDER_SITE_OTHER): Payer: 59 | Admitting: Nurse Practitioner

## 2018-07-04 VITALS — BP 140/97 | HR 101 | Temp 98.2°F | Ht 64.0 in | Wt 256.0 lb

## 2018-07-04 DIAGNOSIS — K625 Hemorrhage of anus and rectum: Secondary | ICD-10-CM

## 2018-07-04 DIAGNOSIS — K5900 Constipation, unspecified: Secondary | ICD-10-CM | POA: Diagnosis not present

## 2018-07-04 DIAGNOSIS — R1030 Lower abdominal pain, unspecified: Secondary | ICD-10-CM | POA: Diagnosis not present

## 2018-07-04 NOTE — Progress Notes (Signed)
Referring Provider: Lucia Gaskins, MD Primary Care Physician:  Lucia Gaskins, MD Primary GI:  Dr. Oneida Alar  Chief Complaint  Patient presents with  . Rectal Bleeding    no episode since april  . Constipation    f/u. Almost BM daily    HPI:   Autumn [name] is a 53 y.o. female who presents for follow-up on constipation and rectal bleeding.  Patient was last seen in our office 03/01/2018 for the same as well as lower abdominal pain.  The patient was previously seen in the emergency department complaining of rectal bleeding and normal previous colonoscopy.  At her last visit she noted chronic abdominal pain for numerous years.  Last colonoscopy 3 to 4 years ago, but not sure, had any pain.  Bleeding for a number of years and typically stops with a colonoscopy.  Couple weeks after colonoscopy her rectal bleeding resumes.  It is intermittent.  States significant constipation and will go "months and months without a bowel movement."  History of hemorrhoids and mid to lower abdominal pain that does not improve after a bowel movement.  Feels very full, limited appetite.  Has never seen a gynecologist and notes no menstrual cycle since her early 65s.  Is not sure of all of her medications.  No other GI complaints.  Recommended Linzess 72 mcg with request a progress report in 1 to 2 weeks, colonoscopy, follow-up in 4 months.  Colonoscopy was delayed due to COVID-19/coronavirus pandemic and is now scheduled for 08/23/2018.  She called our office on follow-up indicating Linzess is helping.  Did have more rectal bleeding and recommended a stat CBC.  Precautions given for ER presentation if severe rectal bleeding.  CBC was completed 04/20/2018 which found normal hemoglobin at 12.5.  Today she states she's doing well overall. She has not had a rectal bleeding episode since April. Constipation is much improved and now having a bowel movement almost daily. Abdominal pain is essentially resolved.  Stools are softer/runny with the medication, which is ok for her. No fecal urgency. Denies N/V, melena, fever, chills, unintentional weight loss. Denies URI or flu-like symptoms. Denies loss of sense of taste or smell. Denies chest pain, dyspnea, dizziness, lightheadedness, syncope, near syncope. Denies any other upper or lower GI symptoms.  Past Medical History:  Diagnosis Date  . Anxiety    Panic attacks- "cries"  . Arthritis   . Complication of anesthesia    had some hallucations  . Fibroids, submucosal 12/02/2016  . GERD (gastroesophageal reflux disease)   . H/O colonoscopy   . Hypertension   . Rectal bleeding   . Shortness of breath dyspnea    with exertion  . Thickened endometrium 12/02/2016    Past Surgical History:  Procedure Laterality Date  . BREAST LUMPECTOMY Right   . CESAREAN SECTION     x3  . COLONOSCOPY    . DENTAL SURGERY    . MASS EXCISION N/A 12/06/2017   Procedure: EXCISION 1CM CYST ON FACE;  Surgeon: Virl Cagey, MD;  Location: [facility];  Service: General;  Laterality: N/A;  . MULTIPLE EXTRACTIONS WITH ALVEOLOPLASTY Bilateral 04/12/2015   Procedure: BILATERAL MULTIPLE EXTRACTIONS NUMBERS TWENTY, TWENTY ONE, TWENTY TWO, TWENTY THREE, TWENTY FOUR, TWENTY FIVE, TWENTY EIGHT, TWENTY NINE WITH ALVEOLOPLASTY;  Surgeon: Diona Browner, DDS;  Location: [place];  Service: Oral Surgery;  Laterality: Bilateral;  . TUBAL LIGATION      Current Outpatient Medications  Medication Sig Dispense Refill  . linaclotide (LINZESS) 72  MCG capsule Take 1 capsule (72 mcg total) by mouth daily before breakfast. 30 capsule 11  . LORazepam (ATIVAN) 1 MG tablet Take 1 mg by mouth at bedtime as needed for anxiety.   0  . naproxen (NAPROSYN) 250 MG tablet Take 250 mg by mouth 2 (two) times daily as needed for mild pain or moderate pain.      No current facility-administered medications for this visit.     Allergies as of 07/04/2018 - Review Complete 07/04/2018  Allergen Reaction Noted   . Bee venom Anaphylaxis 11/04/2010  . Tramadol Hives 11/04/2010  . Asa [aspirin]  08/14/2015  . Peppermint flavor  03/01/2018    Family History  Adopted: Yes  Problem Relation Age of Onset  . Heart disease Other   . Colon cancer Neg Hx     Social History   Socioeconomic History  . Marital status: Significant Other    Spouse name: Not on file  . Number of children: Not on file  . Years of education: Not on file  . Highest education level: Not on file  Occupational History  . Not on file  Social Needs  . Financial resource strain: Not on file  . Food insecurity:    Worry: Not on file    Inability: Not on file  . Transportation needs:    Medical: Not on file    Non-medical: Not on file  Tobacco Use  . Smoking status: Current Every Day Smoker    Packs/day: 1.00    Years: 31.00    Pack years: 31.00    Types: Cigarettes  . Smokeless tobacco: Never Used  Substance and Sexual Activity  . Alcohol use: Yes    Comment: 3 to 4 drinks per day.  . Drug use: No  . Sexual activity: Not Currently    Birth control/protection: Surgical  Lifestyle  . Physical activity:    Days per week: Not on file    Minutes per session: Not on file  . Stress: Not on file  Relationships  . Social connections:    Talks on phone: Not on file    Gets together: Not on file    Attends religious service: Not on file    Active member of club or organization: Not on file    Attends meetings of clubs or organizations: Not on file    Relationship status: Not on file  Other Topics Concern  . Not on file  Social History Narrative  . Not on file    Review of Systems: Complete ROS negative except as per HPI.   Physical Exam: BP (!) 140/97   Pulse (!) 101   Temp 98.2 F (36.8 C) (Oral)   Ht 5\' 4"  (1.626 m)   Wt 256 lb (116.1 kg)   BMI 43.94 kg/m  General:   Alert and oriented. Pleasant and cooperative. Well-nourished and well-developed.  Eyes:  Without icterus, sclera clear and conjunctiva  pink.  Ears:  Normal auditory acuity. Cardiovascular:  S1, S2 present without murmurs appreciated. Extremities without clubbing or edema. Respiratory:  Clear to auscultation bilaterally. No wheezes, rales, or rhonchi. No distress.  Gastrointestinal:  +BS, soft, non-tender and non-distended. No HSM noted. No guarding or rebound. No masses appreciated.  Rectal:  Deferred  Musculoskalatal:  Symmetrical without gross deformities. Neurologic:  Alert and oriented x4;  grossly normal neurologically. Psych:  Alert and cooperative. Normal mood and affect. Heme/Lymph/Immune: No excessive bruising noted.    07/04/2018 10:52 AM   Disclaimer: This  note was dictated with voice recognition software. Similar sounding words can inadvertently be transcribed and may not be corrected upon review.

## 2018-07-04 NOTE — Assessment & Plan Note (Signed)
Previously with significant constipation and states she was only having a bowel movement every couple weeks to month.  She was started on Linzess 72 mcg and is found significant relief with this.  She is now having daily bowel movement.  Her stools are soft/pasty, but she is just fine with this.  Recommend she continue her current regimen and call us if she has any worsening or recurrent constipation.  Colonoscopy is upcoming due to rectal bleeding and is upcoming.

## 2018-07-04 NOTE — Assessment & Plan Note (Signed)
Rectal bleeding likely due to constipation.  For caution sake she was scheduled for colonoscopy which is upcoming.  It had to be postponed due to coronavirus/COVID-19 pandemic.  Her rectal bleeding has significantly improved with improvement in her constipation on Linzess 72 mcg.  She does have a colonoscopy coming in next month and a half.  Follow-up in 6 months, call for any worsening or severe bleeding.

## 2018-07-04 NOTE — Patient Instructions (Signed)
Your health issues we discussed today were:   Constipation with abdominal pain and rectal bleeding: 1. I am glad you are feeling better! 2. Continue taking Linzess 72 mcg every day 3. Call us if you have any worsening or recurrent abdominal pain, constipation, rectal bleeding 4. Keep your appointment for your colonoscopy 5. Further recommendations to follow  Overall I recommend:  1. Continue your other current medications 2. Call us if you have any questions or concerns 3. Follow-up in 6 months   Because of recent events of COVID-19 ("Coronavirus"), follow CDC recommendations:  1. Wash your hand frequently 2. Avoid touching your face 3. Stay away from people who are sick 4. If you have symptoms such as fever, cough, shortness of breath then call your healthcare provider for further guidance 5. If you are sick, STAY AT HOME unless otherwise directed by your healthcare provider. 6. Follow directions from state and national officials regarding staying safe   At [facility] we value your feedback. You may receive a survey about your visit today. Please share your experience as we strive to create trusting relationships with our patients to provide genuine, compassionate, quality care.  We appreciate your understanding and patience as we review any laboratory studies, imaging, and other diagnostic tests that are ordered as we care for you. Our office policy is 5 business days for review of these results, and any emergent or urgent results are addressed in a timely manner for your best interest. If you do not hear from our office in 1 week, please contact us.   We also encourage the use of MyChart, which contains your medical information for your review as well. If you are not enrolled in this feature, an access code is on this after visit summary for your convenience. Thank you for allowing [place] to be involved in your care.  It was great to see you today!  I hope you have a  great summer!!

## 2018-07-04 NOTE — Progress Notes (Signed)
cc'd to pcp 

## 2018-07-04 NOTE — Assessment & Plan Note (Signed)
Abdominal pain has significantly improved/essentially resolved along with improvement and resolution of her constipation.  Recommend she continue her current medications including Linzess 72 mcg daily.  Call for any recurrent or worsening symptoms.  Follow-up in 6 months.  Colonoscopy is upcoming.

## 2018-07-19 NOTE — Progress Notes (Signed)
REVIEWED-NO ADDITIONAL RECOMMENDATIONS. 

## 2018-08-15 NOTE — Patient Instructions (Signed)
Autumn [name]  08/15/2018     @PREFPERIOPPHARMACY @   Your procedure is scheduled on  08/23/2018 .  Report to [facility] at  1130   A.M.  Call this number if you have problems the morning of surgery:  [phone]   Remember:  Follow the diet and prep instructions given to you by Dr Nona Dell office.                     Take these medicines the morning of surgery with A SIP OF WATER  labetolol.    Do not wear jewelry, make-up or nail polish.  Do not wear lotions, powders, or perfumes, or deodorant.  Do not shave 48 hours prior to surgery.  Men may shave face and neck.  Do not bring valuables to the hospital.  [facility] is not responsible for any belongings or valuables.  Contacts, dentures or bridgework may not be worn into surgery.  Leave your suitcase in the car.  After surgery it may be brought to your room.  For patients admitted to the hospital, discharge time will be determined by your treatment team.  Patients discharged the day of surgery will not be allowed to drive home.   Name and phone number of your driver:   family Special instructions:  None  Please read over the following fact sheets that you were given. Anesthesia Post-op Instructions and Care and Recovery After Surgery       Colonoscopy, Adult, Care After This sheet gives you information about how to care for yourself after your procedure. Your health care provider may also give you more specific instructions. If you have problems or questions, contact your health care provider. What can I expect after the procedure? After the procedure, it is common to have:  A small amount of blood in your stool for 24 hours after the procedure.  Some gas.  Mild abdominal cramping or bloating. Follow these instructions at home: General instructions  For the first 24 hours after the procedure: ? Do not drive or use machinery. ? Do not sign important documents. ? Do not drink alcohol. ? Do your  regular daily activities at a slower pace than normal. ? Eat soft, easy-to-digest foods.  Take over-the-counter or prescription medicines only as told by your health care provider. Relieving cramping and bloating   Try walking around when you have cramps or feel bloated.  Apply heat to your abdomen as told by your health care provider. Use a heat source that your health care provider recommends, such as a moist heat pack or a heating pad. ? Place a towel between your skin and the heat source. ? Leave the heat on for 20-30 minutes. ? Remove the heat if your skin turns bright red. This is especially important if you are unable to feel pain, heat, or cold. You may have a greater risk of getting burned. Eating and drinking   Drink enough fluid to keep your urine pale yellow.  Resume your normal diet as instructed by your health care provider. Avoid heavy or fried foods that are hard to digest.  Avoid drinking alcohol for as long as instructed by your health care provider. Contact a health care provider if:  You have blood in your stool 2-3 days after the procedure. Get help right away if:  You have more than a small spotting of blood in your stool.  You pass large blood clots in your stool.  Your abdomen is swollen.  You have nausea or vomiting.  You have a fever.  You have increasing abdominal pain that is not relieved with medicine. Summary  After the procedure, it is common to have a small amount of blood in your stool. You may also have mild abdominal cramping and bloating.  For the first 24 hours after the procedure, do not drive or use machinery, sign important documents, or drink alcohol.  Contact your health care provider if you have a lot of blood in your stool, nausea or vomiting, a fever, or increased abdominal pain. This information is not intended to replace advice given to you by your health care provider. Make sure you discuss any questions you have with your  health care provider. Document Released: 08/27/2003 Document Revised: 11/04/2016 Document Reviewed: 03/26/2015 Elsevier Patient Education  2020 [place] After These instructions provide you with information about caring for yourself after your procedure. Your health care provider may also give you more specific instructions. Your treatment has been planned according to current medical practices, but problems sometimes occur. Call your health care provider if you have any problems or questions after your procedure. What can I expect after the procedure? After your procedure, you may:  Feel sleepy for several hours.  Feel clumsy and have poor balance for several hours.  Feel forgetful about what happened after the procedure.  Have poor judgment for several hours.  Feel nauseous or vomit.  Have a sore throat if you had a breathing tube during the procedure. Follow these instructions at home: For at least 24 hours after the procedure:      Have a responsible adult stay with you. It is important to have someone help care for you until you are awake and alert.  Rest as needed.  Do not: ? Participate in activities in which you could fall or become injured. ? Drive. ? Use heavy machinery. ? Drink alcohol. ? Take sleeping pills or medicines that cause drowsiness. ? Make important decisions or sign legal documents. ? Take care of children on your own. Eating and drinking  Follow the diet that is recommended by your health care provider.  If you vomit, drink water, juice, or soup when you can drink without vomiting.  Make sure you have little or no nausea before eating solid foods. General instructions  Take over-the-counter and prescription medicines only as told by your health care provider.  If you have sleep apnea, surgery and certain medicines can increase your risk for breathing problems. Follow instructions from your health care  provider about wearing your sleep device: ? Anytime you are sleeping, including during daytime naps. ? While taking prescription pain medicines, sleeping medicines, or medicines that make you drowsy.  If you smoke, do not smoke without supervision.  Keep all follow-up visits as told by your health care provider. This is important. Contact a health care provider if:  You keep feeling nauseous or you keep vomiting.  You feel light-headed.  You develop a rash.  You have a fever. Get help right away if:  You have trouble breathing. Summary  For several hours after your procedure, you may feel sleepy and have poor judgment.  Have a responsible adult stay with you for at least 24 hours or until you are awake and alert. This information is not intended to replace advice given to you by your health care provider. Make sure you discuss any questions you have with your health care  provider. Document Released: 05/05/2015 Document Revised: 04/12/2017 Document Reviewed: 05/05/2015 Elsevier Patient Education  2020 [employer].

## 2018-08-16 ENCOUNTER — Other Ambulatory Visit: Payer: Self-pay

## 2018-08-16 ENCOUNTER — Encounter (HOSPITAL_COMMUNITY): Payer: Self-pay

## 2018-08-16 ENCOUNTER — Telehealth: Payer: Self-pay

## 2018-08-16 ENCOUNTER — Encounter (HOSPITAL_COMMUNITY)
Admission: RE | Admit: 2018-08-16 | Discharge: 2018-08-16 | Disposition: A | Discharge status: Home / Self Care | Payer: Medicare Other | Source: Ambulatory Visit | Attending: Gastroenterology | Admitting: Gastroenterology

## 2018-08-16 DIAGNOSIS — F419 Anxiety disorder, unspecified: Secondary | ICD-10-CM | POA: Insufficient documentation

## 2018-08-16 DIAGNOSIS — K625 Hemorrhage of anus and rectum: Secondary | ICD-10-CM | POA: Diagnosis not present

## 2018-08-16 DIAGNOSIS — F17210 Nicotine dependence, cigarettes, uncomplicated: Secondary | ICD-10-CM | POA: Insufficient documentation

## 2018-08-16 DIAGNOSIS — Z01812 Encounter for preprocedural laboratory examination: Secondary | ICD-10-CM | POA: Insufficient documentation

## 2018-08-16 DIAGNOSIS — K219 Gastro-esophageal reflux disease without esophagitis: Secondary | ICD-10-CM | POA: Insufficient documentation

## 2018-08-16 DIAGNOSIS — Z79899 Other long term (current) drug therapy: Secondary | ICD-10-CM | POA: Diagnosis not present

## 2018-08-16 DIAGNOSIS — I10 Essential (primary) hypertension: Secondary | ICD-10-CM | POA: Diagnosis not present

## 2018-08-16 LAB — CBC WITH DIFFERENTIAL/PLATELET
Abs Immature Granulocytes: 0.01 10*3/uL (ref 0.00–0.07)
Basophils Absolute: 0.1 10*3/uL (ref 0.0–0.1)
Basophils Relative: 1 %
Eosinophils Absolute: 0.3 10*3/uL (ref 0.0–0.5)
Eosinophils Relative: 5 %
HCT: 40 % (ref 36.0–46.0)
Hemoglobin: 12.5 g/dL (ref 12.0–15.0)
Immature Granulocytes: 0 %
Lymphocytes Relative: 39 %
Lymphs Abs: 2.8 10*3/uL (ref 0.7–4.0)
MCH: 23.5 pg — ABNORMAL LOW (ref 26.0–34.0)
MCHC: 31.3 g/dL (ref 30.0–36.0)
MCV: 75.2 fL — ABNORMAL LOW (ref 80.0–100.0)
Monocytes Absolute: 0.8 10*3/uL (ref 0.1–1.0)
Monocytes Relative: 11 %
Neutro Abs: 3.2 10*3/uL (ref 1.7–7.7)
Neutrophils Relative %: 44 %
Platelets: 340 10*3/uL (ref 150–400)
RBC: 5.32 MIL/uL — ABNORMAL HIGH (ref 3.87–5.11)
RDW: 14.1 % (ref 11.5–15.5)
WBC: 7.2 10*3/uL (ref 4.0–10.5)
nRBC: 0 % (ref 0.0–0.2)

## 2018-08-16 LAB — HCG, QUANTITATIVE, PREGNANCY: hCG, Beta Chain, Quant, S: 1 m[IU]/mL (ref <5)

## 2018-08-16 NOTE — Telephone Encounter (Addendum)
Pt left VM that she was having some bloating and wanted to have a pregnancy test.  I returned her call and told her we do not do pregnancy testing she should discuss with PCP.  She said she also has some abdominal pain in her mid abdomen and she usually wakes up with it and it gets better through the day.  She said she doesn't have a BM everyday, but about every other day. On the days that she does have a BM, they are usually watery and she will have 2-3 and then skip the next day.  She is taking the Linzess 72 ncg daily.  She was at the lab and had to hang up to get blood drawn and said she will call back to complete the discussion.   Pt called back and said she has some nausea occasionally but has vomited a couple of times.  She is scheduled for her colonoscopy on 08/23/2018.  Forwarding to Walden Field, NP who saw pt last in the office.

## 2018-08-17 NOTE — Telephone Encounter (Signed)
Keep her appointment for her colonoscopy in a week. Further recommendations to follow her procedure.  If she's still having significant loose stools, stop Linzess. Call back a couple weeks after her colonoscopy with an update on her symptoms. Make sure she's drinking plenty of fluids to stay hydrated given her diarrhea (and especially with all the heat recently)

## 2018-08-18 NOTE — Telephone Encounter (Signed)
PT is aware.

## 2018-08-19 ENCOUNTER — Other Ambulatory Visit: Payer: Medicare Other

## 2018-08-19 ENCOUNTER — Other Ambulatory Visit: Payer: Self-pay

## 2018-08-19 ENCOUNTER — Other Ambulatory Visit (HOSPITAL_COMMUNITY)
Admission: RE | Admit: 2018-08-19 | Discharge: 2018-08-19 | Disposition: A | Discharge status: Home / Self Care | Payer: Medicare Other | Source: Ambulatory Visit | Attending: Gastroenterology | Admitting: Gastroenterology

## 2018-08-19 DIAGNOSIS — Z20822 Contact with and (suspected) exposure to covid-19: Secondary | ICD-10-CM

## 2018-08-22 ENCOUNTER — Telehealth: Payer: Self-pay

## 2018-08-22 ENCOUNTER — Telehealth: Payer: Self-pay | Admitting: Gastroenterology

## 2018-08-22 LAB — NOVEL CORONAVIRUS, NAA: SARS-CoV-2, NAA: NOT DETECTED

## 2018-08-22 NOTE — Telephone Encounter (Signed)
Called patient. She is confused about her instructions. I advised patient procedure time is 8:45am. She needs to arrive at 7:15am (called carolyn in endo to verify). She will drink 2nd half of prep at 3:45am and npo 5:45am. She voiced understanding

## 2018-08-22 NOTE — Telephone Encounter (Signed)
[place], SHE IS UNSURE WHAT TIME SHE IS SUPPOSED TO GO FOR HER PROCEDURE

## 2018-08-22 NOTE — Telephone Encounter (Signed)
Called pt, TCS for tomorrow moved up to 8:45am. Advised her to start drinking 2nd half of prep tomorrow morning at 3:45am and NPO after 5:45am. Endo scheduler informed.

## 2018-08-23 ENCOUNTER — Other Ambulatory Visit: Payer: Self-pay

## 2018-08-23 ENCOUNTER — Ambulatory Visit (HOSPITAL_COMMUNITY): Payer: Medicare Other | Admitting: Anesthesiology

## 2018-08-23 ENCOUNTER — Encounter (HOSPITAL_COMMUNITY): Payer: Self-pay | Admitting: Gastroenterology

## 2018-08-23 ENCOUNTER — Encounter (HOSPITAL_COMMUNITY)
Admission: RE | Disposition: A | Discharge status: Home / Self Care | Payer: Self-pay | Source: Home / Self Care | Attending: Gastroenterology

## 2018-08-23 ENCOUNTER — Ambulatory Visit (HOSPITAL_COMMUNITY)
Admission: RE | Admit: 2018-08-23 | Discharge: 2018-08-23 | Disposition: A | Discharge status: Home / Self Care | Payer: Medicare Other | Attending: Gastroenterology | Admitting: Gastroenterology

## 2018-08-23 DIAGNOSIS — K621 Rectal polyp: Secondary | ICD-10-CM | POA: Diagnosis not present

## 2018-08-23 DIAGNOSIS — F419 Anxiety disorder, unspecified: Secondary | ICD-10-CM | POA: Diagnosis not present

## 2018-08-23 DIAGNOSIS — Z79899 Other long term (current) drug therapy: Secondary | ICD-10-CM | POA: Insufficient documentation

## 2018-08-23 DIAGNOSIS — K648 Other hemorrhoids: Secondary | ICD-10-CM | POA: Diagnosis not present

## 2018-08-23 DIAGNOSIS — K625 Hemorrhage of anus and rectum: Secondary | ICD-10-CM

## 2018-08-23 DIAGNOSIS — Q438 Other specified congenital malformations of intestine: Secondary | ICD-10-CM | POA: Insufficient documentation

## 2018-08-23 DIAGNOSIS — K219 Gastro-esophageal reflux disease without esophagitis: Secondary | ICD-10-CM | POA: Insufficient documentation

## 2018-08-23 DIAGNOSIS — R1030 Lower abdominal pain, unspecified: Secondary | ICD-10-CM

## 2018-08-23 DIAGNOSIS — M1990 Unspecified osteoarthritis, unspecified site: Secondary | ICD-10-CM | POA: Insufficient documentation

## 2018-08-23 DIAGNOSIS — I10 Essential (primary) hypertension: Secondary | ICD-10-CM | POA: Insufficient documentation

## 2018-08-23 DIAGNOSIS — K644 Residual hemorrhoidal skin tags: Secondary | ICD-10-CM | POA: Diagnosis not present

## 2018-08-23 DIAGNOSIS — F17210 Nicotine dependence, cigarettes, uncomplicated: Secondary | ICD-10-CM | POA: Insufficient documentation

## 2018-08-23 DIAGNOSIS — K635 Polyp of colon: Secondary | ICD-10-CM | POA: Insufficient documentation

## 2018-08-23 DIAGNOSIS — K921 Melena: Secondary | ICD-10-CM | POA: Diagnosis present

## 2018-08-23 HISTORY — PX: COLONOSCOPY WITH PROPOFOL: SHX5780

## 2018-08-23 HISTORY — PX: POLYPECTOMY: SHX5525

## 2018-08-23 SURGERY — COLONOSCOPY WITH PROPOFOL
Anesthesia: General

## 2018-08-23 MED ORDER — MIDAZOLAM HCL 2 MG/2ML IJ SOLN
INTRAMUSCULAR | Status: AC
  Filled 2018-08-23: qty 2

## 2018-08-23 MED ORDER — MIDAZOLAM HCL 2 MG/2ML IJ SOLN: 0.5000 mg | Freq: Once | INTRAMUSCULAR | Status: DC | PRN

## 2018-08-23 MED ORDER — PROMETHAZINE HCL 25 MG/ML IJ SOLN: 6.2500 mg | INTRAMUSCULAR | Status: DC | PRN

## 2018-08-23 MED ORDER — PROPOFOL 10 MG/ML IV BOLUS
INTRAVENOUS | Status: AC
  Filled 2018-08-23: qty 40

## 2018-08-23 MED ORDER — CHLORHEXIDINE GLUCONATE CLOTH 2 % EX PADS: 6.0000 | MEDICATED_PAD | Freq: Once | CUTANEOUS | Status: DC

## 2018-08-23 MED ORDER — PROPOFOL 500 MG/50ML IV EMUL
INTRAVENOUS | Status: DC | PRN
  Administered 2018-08-23: 09:00:00 via INTRAVENOUS
  Administered 2018-08-23: 150 ug/kg/min via INTRAVENOUS

## 2018-08-23 MED ORDER — LACTATED RINGERS IV SOLN
INTRAVENOUS | Status: DC
  Administered 2018-08-23: 08:00:00 via INTRAVENOUS

## 2018-08-23 MED ORDER — MIDAZOLAM HCL 5 MG/5ML IJ SOLN
INTRAMUSCULAR | Status: DC | PRN
  Administered 2018-08-23 (×2): 2 mg via INTRAVENOUS

## 2018-08-23 MED ORDER — PROPOFOL 10 MG/ML IV BOLUS
INTRAVENOUS | Status: DC | PRN
  Administered 2018-08-23: 20 mg via INTRAVENOUS

## 2018-08-23 NOTE — H&P (Signed)
Primary Care Physician:  Lucia Gaskins, MD Primary Gastroenterologist:  Dr. Oneida Alar  Pre-Procedure History & Physical: HPI:  Autumn [name] is a 53 y.o. female here for BRBPR/ABDOMINAL PAIN.  Past Medical History:  Diagnosis Date  . Anxiety    Panic attacks- "cries"  . Arthritis   . Complication of anesthesia    had some hallucations  . Fibroids, submucosal 12/02/2016  . GERD (gastroesophageal reflux disease)   . H/O colonoscopy   . Hypertension   . Rectal bleeding   . Shortness of breath dyspnea    with exertion  . Thickened endometrium 12/02/2016    Past Surgical History:  Procedure Laterality Date  . BREAST LUMPECTOMY Right   . CESAREAN SECTION     x3  . COLONOSCOPY    . DENTAL SURGERY    . MASS EXCISION N/A 12/06/2017   Procedure: EXCISION 1CM CYST ON FACE;  Surgeon: Virl Cagey, MD;  Location: [facility];  Service: General;  Laterality: N/A;  . MULTIPLE EXTRACTIONS WITH ALVEOLOPLASTY Bilateral 04/12/2015   Procedure: BILATERAL MULTIPLE EXTRACTIONS NUMBERS TWENTY, TWENTY ONE, TWENTY TWO, TWENTY THREE, TWENTY FOUR, TWENTY FIVE, TWENTY EIGHT, TWENTY NINE WITH ALVEOLOPLASTY;  Surgeon: Diona Browner, DDS;  Location: [facility];  Service: Oral Surgery;  Laterality: Bilateral;  . TUBAL LIGATION      Prior to Admission medications   Medication Sig Start Date End Date Taking? Authorizing Provider  labetalol (NORMODYNE) 200 MG tablet Take 200 mg by mouth 2 (two) times daily.   Yes [provider]  linaclotide Rolan Lipa) 72 MCG capsule Take 1 capsule (72 mcg total) by mouth daily before breakfast. 04/27/18  Yes Mahala Menghini, PA-C  LORazepam (ATIVAN) 1 MG tablet Take 1 mg by mouth at bedtime.  11/05/17  Yes [provider]  Multiple Vitamin (MULTIVITAMIN PO) Take 1 tablet by mouth daily.   Yes [provider]  naproxen (NAPROSYN) 500 MG tablet Take 500 mg by mouth 2 (two) times a day.    Yes [provider]    Allergies as of 03/01/2018  - Review Complete 03/01/2018  Allergen Reaction Noted  . Bee venom Anaphylaxis 11/04/2010  . Tramadol Hives 11/04/2010  . Asa [aspirin]  08/14/2015  . Peppermint flavor  03/01/2018    Family History  Adopted: Yes  Problem Relation Age of Onset  . Heart disease Other   . Colon cancer Neg Hx     Social History   Socioeconomic History  . Marital status: Significant Other    Spouse name: Not on file  . Number of children: Not on file  . Years of education: Not on file  . Highest education level: Not on file  Occupational History  . Not on file  Social Needs  . Financial resource strain: Not on file  . Food insecurity    Worry: Not on file    Inability: Not on file  . Transportation needs    Medical: Not on file    Non-medical: Not on file  Tobacco Use  . Smoking status: Current Every Day Smoker    Packs/day: 1.00    Years: 31.00    Pack years: 31.00    Types: Cigarettes  . Smokeless tobacco: Never Used  Substance and Sexual Activity  . Alcohol use: Yes    Comment: 3 to 4 drinks per day.  . Drug use: No  . Sexual activity: Not Currently    Birth control/protection: Surgical  Lifestyle  . Physical activity    Days  per week: Not on file    Minutes per session: Not on file  . Stress: Not on file  Relationships  . Social Herbalist on phone: Not on file    Gets together: Not on file    Attends religious service: Not on file    Active member of club or organization: Not on file    Attends meetings of clubs or organizations: Not on file    Relationship status: Not on file  . Intimate partner violence    Fear of current or ex partner: Not on file    Emotionally abused: Not on file    Physically abused: Not on file    Forced sexual activity: Not on file  Other Topics Concern  . Not on file  Social History Narrative  . Not on file    Review of Systems: See HPI, otherwise negative ROS   Physical Exam: BP (!) 169/89   Pulse 80   LMP 11/11/2016  Comment: first period in over 1 year  SpO2 98%  General:   Alert,  pleasant and cooperative in NAD Head:  Normocephalic and atraumatic. Neck:  Supple; Lungs:  Clear throughout to auscultation.    Heart:  Regular rate and rhythm. Abdomen:  Soft, nontender and nondistended. Normal bowel sounds, without guarding, and without rebound.   Neurologic:  Alert and  oriented x4;  grossly normal neurologically.  Impression/Plan:    BRBPR  PLAN: TCS TODAY. DISCUSSED PROCEDURE, BENEFITS, & RISKS: < 1% chance of medication reaction, bleeding, perforation, ASPIRATION, or rupture of spleen/liver requiring surgery to fix it and missed polyps < 1 cm 10-20% of the time.

## 2018-08-23 NOTE — Anesthesia Preprocedure Evaluation (Signed)
Anesthesia Evaluation  Patient identified by MRN, date of birth, ID band Patient awake  General Assessment Comment:States hallucinated ~20 years ago with anesthesia   Reviewed: Allergy & Precautions, NPO status , Patient's Chart, lab work & pertinent test results, reviewed documented beta blocker date and time   History of Anesthesia Complications (+) history of anesthetic complications  Airway Mallampati: I  TM Distance: >3 FB Neck ROM: Full    Dental no notable dental hx. (+) Edentulous Upper, Edentulous Lower   Pulmonary neg pulmonary ROS, Current Smoker,  Smoker States smoked today    Pulmonary exam normal breath sounds clear to auscultation       Cardiovascular Exercise Tolerance: Good hypertension, Pt. on medications and Pt. on home beta blockers negative cardio ROS Normal cardiovascular examI Rhythm:Regular Rate:Normal  Denies CP/MI States she is active    Neuro/Psych Anxiety negative neurological ROS  negative psych ROS   GI/Hepatic Neg liver ROS, neg GERD  ,Denied GERD or meds when questioned    Endo/Other  negative endocrine ROSBMI ~39  Renal/GU negative Renal ROS  negative genitourinary   Musculoskeletal  (+) Arthritis ,   Abdominal   Peds negative pediatric ROS (+)  Hematology negative hematology ROS (+) anemia ,   Anesthesia Other Findings   Reproductive/Obstetrics negative OB ROS                             Anesthesia Physical Anesthesia Plan  ASA: II  Anesthesia Plan: General   Post-op Pain Management:    Induction: Intravenous  PONV Risk Score and Plan: 2 and Treatment may vary due to age or medical condition, Midazolam, Propofol infusion and TIVA  Airway Management Planned: Simple Face Mask and Nasal Cannula  Additional Equipment:   Intra-op Plan:   Post-operative Plan:   Informed Consent: I have reviewed the patients History and Physical, chart, labs  and discussed the procedure including the risks, benefits and alternatives for the proposed anesthesia with the patient or authorized representative who has indicated his/her understanding and acceptance.     Dental advisory given  Plan Discussed with: CRNA  Anesthesia Plan Comments: (Plan Full PPE  Plan GA with GETA as needed -WTP with same  Anxious -on chronic BZDs)        Anesthesia Quick Evaluation

## 2018-08-23 NOTE — OR Nursing (Signed)
Assisted to bathroom to void

## 2018-08-23 NOTE — Discharge Instructions (Signed)
Your RECTAL BLEEDING IS DUE TO internal hemorrhoids.You had 3 small polyps removed.    DRINK WATER TO KEEP YOUR URINE LIGHT YELLOW.  CONTINUE YOUR WEIGHT LOSS EFFORTS. YOUR BODY MASS INDEX IS OVER 40 WHICH MEANS YOU ARE MORBIDLY OBESE. OBESITY IS ASSOCIATED WITH AN INCREASED FOR CIRRHOSIS AND ALL CANCERS, INCLUDING ESOPHAGEAL AND COLON CANCER  AND DECREASES YOUR LIFE EXPECTANCY 10 YEARS. I RECOMMEND YOU READ AND FOLLOW RECOMMENDATIONS OF DR. MARK HYMAN, "10-DAY DETOX DIET". IF YOU TRY AND CANNOT LOSE WEIGHT OVER THE NEXT YEAR, PLEASE CALL FOR A REFERRAL TO THE BARIATRIC WEIGHT LOSS CLINIC. OTHERWISE, FOLLOW A HIGH FIBER DIET. AVOID ITEMS THAT CAUSE BLOATING & GAS. SEE INFO BELOW.   USE PREPARATION H FOUR TIMES  A DAY IF NEEDED TO RELIEVE RECTAL PAIN/PRESSURE/BLEEDING. YOU CAN SEE SURGERY TO FIX YOUR HEMORRHOIDS IF YOUR SYMPTOMS CANNOT BE CONTROLLED WITH CREAMS OF SUPPOSITORIES.     YOUR BIOPSY RESULTS WILL BE BACK IN 5 BUSINESS DAYS.  Next colonoscopy in 5-10 years.    Colonoscopy Care After Read the instructions outlined below and refer to this sheet in the next week. These discharge instructions provide you with general information on caring for yourself after you leave the hospital. While your treatment has been planned according to the most current medical practices available, unavoidable complications occasionally occur. If you have any problems or questions after discharge, call DR. Theta Leaf, [phone].  ACTIVITY  You may resume your regular activity, but move at a slower pace for the next 24 hours.   Take frequent rest periods for the next 24 hours.   Walking will help get rid of the air and reduce the bloated feeling in your belly (abdomen).   No driving for 24 hours (because of the medicine (anesthesia) used during the test).   You may shower.   Do not sign any important legal documents or operate any machinery for 24 hours (because of the anesthesia used during the test).      NUTRITION  Drink plenty of fluids.   You may resume your normal diet as instructed by your doctor.   Begin with a light meal and progress to your normal diet. Heavy or fried foods are harder to digest and may make you feel sick to your stomach (nauseated).   Avoid alcoholic beverages for 24 hours or as instructed.    MEDICATIONS  You may resume your normal medications.   WHAT YOU CAN EXPECT TODAY  Some feelings of bloating in the abdomen.   Passage of more gas than usual.   Spotting of blood in your stool or on the toilet paper  .  IF YOU HAD POLYPS REMOVED DURING THE COLONOSCOPY:  Eat a soft diet IF YOU HAVE NAUSEA, BLOATING, ABDOMINAL PAIN, OR VOMITING.    FINDING OUT THE RESULTS OF YOUR TEST Not all test results are available during your visit. DR. Oneida Alar WILL CALL YOU WITHIN 14 DAYS OF YOUR PROCEDUE WITH YOUR RESULTS. Do not assume everything is normal if you have not heard from DR. Bharat Antillon, CALL HER OFFICE AT [phone].  SEEK IMMEDIATE MEDICAL ATTENTION AND CALL THE OFFICE: [phone] IF:  You have more than a spotting of blood in your stool.   Your belly is swollen (abdominal distention).   You are nauseated or vomiting.   You have a temperature over 101F.   You have abdominal pain or discomfort that is severe or gets worse throughout the day.   High-Fiber Diet A high-fiber diet changes your normal diet  to include more whole grains, legumes, fruits, and vegetables. Changes in the diet involve replacing refined carbohydrates with unrefined foods. The calorie level of the diet is essentially unchanged. The Dietary Reference Intake (recommended amount) for adult males is 38 grams per day. For adult females, it is 25 grams per day. Pregnant and lactating women should consume 28 grams of fiber per day. Fiber is the intact part of a plant that is not broken down during digestion. Functional fiber is fiber that has been isolated from the plant to provide a  beneficial effect in the body.  PURPOSE  Increase stool bulk.   Ease and regulate bowel movements.   Lower cholesterol.   REDUCE RISK OF COLON CANCER  INDICATIONS THAT YOU NEED MORE FIBER  Constipation and hemorrhoids.   Uncomplicated diverticulosis (intestine condition) and irritable bowel syndrome.   Weight management.   As a protective measure against hardening of the arteries (atherosclerosis), diabetes, and cancer.   GUIDELINES FOR INCREASING FIBER IN THE DIET  Start adding fiber to the diet slowly. A gradual increase of about 5 more grams (2 slices of whole-wheat bread, 2 servings of most fruits or vegetables, or 1 bowl of high-fiber cereal) per day is best. Too rapid an increase in fiber may result in constipation, flatulence, and bloating.   Drink enough water and fluids to keep your urine clear or pale yellow. Water, juice, or caffeine-free drinks are recommended. Not drinking enough fluid may cause constipation.   Eat a variety of high-fiber foods rather than one type of fiber.   Try to increase your intake of fiber through using high-fiber foods rather than fiber pills or supplements that contain small amounts of fiber.   The goal is to change the types of food eaten. Do not supplement your present diet with high-fiber foods, but replace foods in your present diet.   INCLUDE A VARIETY OF FIBER SOURCES  Replace refined and processed grains with whole grains, canned fruits with fresh fruits, and incorporate other fiber sources. White rice, white breads, and most bakery goods contain little or no fiber.   Brown whole-grain rice, buckwheat oats, and many fruits and vegetables are all good sources of fiber. These include: broccoli, Brussels sprouts, cabbage, cauliflower, beets, sweet potatoes, white potatoes (skin on), carrots, tomatoes, eggplant, squash, berries, fresh fruits, and dried fruits.   Cereals appear to be the richest source of fiber. Cereal fiber is found in  whole grains and bran. Bran is the fiber-rich outer coat of cereal grain, which is largely removed in refining. In whole-grain cereals, the bran remains. In breakfast cereals, the largest amount of fiber is found in those with "bran" in their names. The fiber content is sometimes indicated on the label.   You may need to include additional fruits and vegetables each day.   In baking, for 1 cup white flour, you may use the following substitutions:   1 cup whole-wheat flour minus 2 tablespoons.   1/2 cup white flour plus 1/2 cup whole-wheat flour.   Polyps, Colon  A polyp is extra tissue that grows inside your body. Colon polyps grow in the large intestine. The large intestine, also called the colon, is part of your digestive system. It is a long, hollow tube at the end of your digestive tract where your body makes and stores stool. Most polyps are not dangerous. They are benign. This means they are not cancerous. But over time, some types of polyps can turn into cancer. Polyps that are  smaller than a pea are usually not harmful. But larger polyps could someday become or may already be cancerous. To be safe, doctors remove all polyps and test them.   WHO GETS POLYPS? Anyone can get polyps, but certain people are more likely than others. You may have a greater chance of getting polyps if:  You are over 50.   You have had polyps before.   Someone in your family has had polyps.   Someone in your family has had cancer of the large intestine.   Find out if someone in your family has had polyps. You may also be more likely to get polyps if you:   Eat a lot of fatty foods   Smoke   Drink alcohol   Do not exercise  Eat too much   PREVENTION There is not one sure way to prevent polyps. You might be able to lower your risk of getting them if you:  Eat more fruits and vegetables and less fatty food.   Do not smoke.   Avoid alcohol.   Exercise every day.   Lose weight if you are  overweight.   Eating more calcium and folate can also lower your risk of getting polyps. Some foods that are rich in calcium are milk, cheese, and broccoli. Some foods that are rich in folate are chickpeas, kidney beans, and spinach.   Hemorrhoids Hemorrhoids are dilated (enlarged) veins around the rectum. Sometimes clots will form in the veins. This makes them swollen and painful. These are called thrombosed hemorrhoids. Causes of hemorrhoids include:  Constipation.   Straining to have a bowel movement.   HEAVY LIFTING  HOME CARE INSTRUCTIONS  Eat a well balanced diet and drink 6 to 8 glasses of water every day to avoid constipation. You may also use a bulk laxative.   Avoid straining to have bowel movements.   Keep anal area dry and clean.   Do not use a donut shaped pillow or sit on the toilet for long periods. This increases blood pooling and pain.   Move your bowels when your body has the urge; this will require less straining and will decrease pain and pressure.

## 2018-08-23 NOTE — Op Note (Signed)
[facility] Patient Name: Autumn [name] Procedure Date: 08/23/2018 8:16 AM MRN: [id number] Date of Birth: Feb 18, 1965 Attending MD: Barney Drain MD, MD CSN: [id number] Age: 53 Admit Type: Outpatient Procedure:                Colonoscopy WITH COLD SNARE POLYPECTOMY Indications:              Hematochezia Providers:                Barney Drain MD, MD Referring MD:             Ralene Bathe. Dondiego, MD Medicines:                Propofol per Anesthesia Complications:            No immediate complications. Estimated Blood Loss:     Estimated blood loss was minimal. Procedure:                Pre-Anesthesia Assessment:                           - Prior to the procedure, a History and Physical                            was performed, and patient medications and                            allergies were reviewed. The patient's tolerance of                            previous anesthesia was also reviewed. The risks                            and benefits of the procedure and the sedation                            options and risks were discussed with the patient.                            All questions were answered, and informed consent                            was obtained. Prior Anticoagulants: The patient has                            taken no previous anticoagulant or antiplatelet                            agents except for NSAID medication. ASA Grade                            Assessment: II - A patient with mild systemic                            disease. After reviewing the risks and benefits,  the patient was deemed in satisfactory condition to                            undergo the procedure. After obtaining informed                            consent, the colonoscope was passed under direct                            vision. Throughout the procedure, the patient's                            blood pressure, pulse, and oxygen saturations were                             monitored continuously. The [id number]-[id number] ([id number])                            scope was introduced through the anus and advanced                            to the 5 cm into the ileum. The colonoscopy was                            somewhat difficult due to a tortuous colon.                            Successful completion of the procedure was aided by                            straightening and shortening the scope to obtain                            bowel loop reduction and COLOWRAP. The patient                            tolerated the procedure well. The quality of the                            bowel preparation was excellent. The terminal                            ileum, ileocecal valve, appendiceal orifice, and                            rectum were photographed. Scope In: 9:03:38 AM Scope Out: 9:21:31 AM Scope Withdrawal Time: 0 hours 15 minutes 36 seconds  Total Procedure Duration: 0 hours 17 minutes 53 seconds  Findings:      The terminal ileum appeared normal.      Three sessile polyps were found in the rectum(2) and sigmoid colon. The       polyps were 2 to 4 mm in size. These polyps were removed with a cold       snare. Resection  and retrieval were complete.      External and internal hemorrhoids were found. The hemorrhoids were       moderate.      The recto-sigmoid colon and sigmoid colon were mildly tortuous. Impression:               - The examined portion of the ileum was normal.                           - Three 2 to 4 mm polyps in the rectum and in the                            sigmoid colon, removed with a cold snare. Resected                            and retrieved.                           - External and internal hemorrhoids.                           - Tortuous LEFT colon. Moderate Sedation:      Per Anesthesia Care Recommendation:           - Patient has a contact number available for                            emergencies. The  signs and symptoms of potential                            delayed complications were discussed with the                            patient. Return to normal activities tomorrow.                            Written discharge instructions were provided to the                            patient.                           - High fiber diet.                           - Continue present medications.                           - Await pathology results.                           - Repeat colonoscopy in 5-10 years for surveillance. Procedure Code(s):        --- Professional ---                           [phone], Colonoscopy, flexible; with removal of  tumor(s), polyp(s), or other lesion(s) by snare                            technique Diagnosis Code(s):        --- Professional ---                           K64.8, Other hemorrhoids                           K62.1, Rectal polyp                           K63.5, Polyp of colon                           K92.1, Melena (includes Hematochezia)                           Q43.8, Other specified congenital malformations of                            intestine CPT copyright 2019 [employer]. All rights reserved. The codes documented in this report are preliminary and upon coder review may  be revised to meet current compliance requirements. Barney Drain, MD Barney Drain MD, MD 08/23/2018 9:34:01 AM This report has been signed electronically. Number of Addenda: 0

## 2018-08-23 NOTE — Transfer of Care (Signed)
Immediate Anesthesia Transfer of Care Note  Patient: Autumn [name]  Procedure(s) Performed: COLONOSCOPY WITH PROPOFOL (N/A ) POLYPECTOMY  Patient Location: PACU  Anesthesia Type:General  Level of Consciousness: awake and patient cooperative  Airway & Oxygen Therapy: Patient Spontanous Breathing and Patient connected to nasal cannula oxygen  Post-op Assessment: Report given to RN, Post -op Vital signs reviewed and stable and Patient moving all extremities X 4  Post vital signs: Reviewed and stable  Last Vitals:  Vitals Value Taken Time  BP    Temp    Pulse    Resp 17 08/23/18 0927  SpO2    Vitals shown include unvalidated device data.  Last Pain:  Vitals:   08/23/18 0743  TempSrc: Oral  PainSc: 0-No pain      Patients Stated Pain Goal: 8 (11/26/26 1188)  Complications: No apparent anesthesia complications

## 2018-08-23 NOTE — Anesthesia Postprocedure Evaluation (Signed)
Anesthesia Post Note  Patient: Autumn [name]  Procedure(s) Performed: COLONOSCOPY WITH PROPOFOL (N/A ) POLYPECTOMY  Patient location during evaluation: PACU Anesthesia Type: General Level of consciousness: awake, oriented and patient cooperative Pain management: pain level controlled Vital Signs Assessment: post-procedure vital signs reviewed and stable Respiratory status: spontaneous breathing, respiratory function stable and nonlabored ventilation Cardiovascular status: blood pressure returned to baseline Postop Assessment: no apparent nausea or vomiting Anesthetic complications: no     Last Vitals:  Vitals:   08/23/18 0743  BP: (!) 169/89  Pulse: 80  SpO2: 98%    Last Pain:  Vitals:   08/23/18 0743  TempSrc: Oral  PainSc: 0-No pain                 [name] J

## 2018-08-24 ENCOUNTER — Telehealth: Payer: Self-pay | Admitting: Gastroenterology

## 2018-08-24 NOTE — Telephone Encounter (Signed)
[phone] PLEASE CALL PATIENT, SHE HAD HER PROCEDURE YESTERDAY AND WANTS TO KNOW IF SHE SHOULD CONTINUE TAKING HER LINZESS

## 2018-08-25 NOTE — Telephone Encounter (Signed)
Pt has been on the Linzess 72 mcg for a while and it has been working. I told her it is fine to stay on it.  FYI to Walden Field, NP who saw pt in the office.

## 2018-08-25 NOTE — Telephone Encounter (Signed)
Noted, no further recommendations. 

## 2018-08-31 ENCOUNTER — Encounter (HOSPITAL_COMMUNITY): Payer: Self-pay | Admitting: Gastroenterology

## 2018-09-06 ENCOUNTER — Other Ambulatory Visit: Payer: Self-pay

## 2018-09-06 ENCOUNTER — Other Ambulatory Visit (HOSPITAL_COMMUNITY)
Admission: RE | Admit: 2018-09-06 | Discharge: 2018-09-06 | Disposition: A | Discharge status: Home / Self Care | Payer: Medicare Other | Source: Ambulatory Visit | Attending: Internal Medicine | Admitting: Internal Medicine

## 2018-09-06 DIAGNOSIS — I110 Hypertensive heart disease with heart failure: Secondary | ICD-10-CM | POA: Diagnosis present

## 2018-09-06 DIAGNOSIS — E1165 Type 2 diabetes mellitus with hyperglycemia: Secondary | ICD-10-CM | POA: Insufficient documentation

## 2018-09-06 DIAGNOSIS — E7849 Other hyperlipidemia: Secondary | ICD-10-CM | POA: Insufficient documentation

## 2018-09-06 DIAGNOSIS — E559 Vitamin D deficiency, unspecified: Secondary | ICD-10-CM | POA: Insufficient documentation

## 2018-09-06 LAB — COMPREHENSIVE METABOLIC PANEL
ALT: 27 U/L (ref 0–44)
AST: 32 U/L (ref 15–41)
Albumin: 3.8 g/dL (ref 3.5–5.0)
Alkaline Phosphatase: 85 U/L (ref 38–126)
Anion gap: 9 (ref 5–15)
BUN: 9 mg/dL (ref 6–20)
CO2: 22 mmol/L (ref 22–32)
Calcium: 9.2 mg/dL (ref 8.9–10.3)
Chloride: 108 mmol/L (ref 98–111)
Creatinine, Ser: 0.8 mg/dL (ref 0.44–1.00)
GFR calc Af Amer: >60 mL/min (ref >60)
GFR calc non Af Amer: >60 mL/min (ref >60)
Glucose, Bld: 116 mg/dL — ABNORMAL HIGH (ref 70–99)
Potassium: 3.7 mmol/L (ref 3.5–5.1)
Sodium: 139 mmol/L (ref 135–145)
Total Bilirubin: 0.5 mg/dL (ref 0.3–1.2)
Total Protein: 7.3 g/dL (ref 6.5–8.1)

## 2018-09-06 LAB — TSH: TSH: 0.802 u[IU]/mL (ref 0.350–4.500)

## 2018-09-06 LAB — HEMOGLOBIN A1C
Hgb A1c MFr Bld: 5.9 % — ABNORMAL HIGH (ref 4.8–5.6)
Mean Plasma Glucose: 122.63 mg/dL

## 2018-09-06 LAB — CBC
HCT: 38.7 % (ref 36.0–46.0)
Hemoglobin: 11.9 g/dL — ABNORMAL LOW (ref 12.0–15.0)
MCH: 23 pg — ABNORMAL LOW (ref 26.0–34.0)
MCHC: 30.7 g/dL (ref 30.0–36.0)
MCV: 74.9 fL — ABNORMAL LOW (ref 80.0–100.0)
Platelets: 361 10*3/uL (ref 150–400)
RBC: 5.17 MIL/uL — ABNORMAL HIGH (ref 3.87–5.11)
RDW: 14 % (ref 11.5–15.5)
WBC: 6.1 10*3/uL (ref 4.0–10.5)
nRBC: 0 % (ref 0.0–0.2)

## 2018-09-06 LAB — LIPID PANEL
Cholesterol: 149 mg/dL (ref 0–200)
HDL: 50 mg/dL (ref >40)
LDL Cholesterol: 87 mg/dL (ref 0–99)
Total CHOL/HDL Ratio: 3 RATIO
Triglycerides: 61 mg/dL (ref <150)
VLDL: 12 mg/dL (ref 0–40)

## 2018-09-06 LAB — T4, FREE: Free T4: 1.07 ng/dL (ref 0.61–1.12)

## 2018-09-07 LAB — VITAMIN D 25 HYDROXY (VIT D DEFICIENCY, FRACTURES): Vit D, 25-Hydroxy: 69.6 ng/mL (ref 30.0–100.0)

## 2018-09-09 ENCOUNTER — Telehealth: Payer: Self-pay | Admitting: *Deleted

## 2018-09-09 NOTE — Telephone Encounter (Signed)
Pt called in wanting to know what she could use for her hemorrhoids.  Pt advised to use Preparation H 4 times a day as needed as recommended on her AVS and path report.  Pt voiced understanding.Marland Kitchen

## 2018-09-13 NOTE — Progress Notes (Signed)
ON RECALL AND CC'D TO PCP °

## 2018-11-02 ENCOUNTER — Telehealth: Payer: Self-pay | Admitting: *Deleted

## 2018-11-02 NOTE — Telephone Encounter (Signed)
PLEASE CALL PT. THE BLOOD IS DUE TO HEMORRHOIDS. USE PREPARATION H FOUR TIMES A DAY FOR 7 DAYS THEN IF NEEDED TO RELIEVE RECTAL PAIN/PRESSURE/BLEEDING. IF LINZESS IS CAUSING LOOSE STOOLS, SHE SHOULD, OPEN LINZESS CAPSULE. PLACE IN 4 TSP WATER. STIR IT FOR 30 SECONDS AND TAKE 3 TSP LIQUID OF LIQUID DAILY. SHE DOES NOT NEED TO DRINK THE GRANULES. THE MEDICINE IS IN THE WATER.

## 2018-11-02 NOTE — Telephone Encounter (Signed)
PT is aware and said she has been using the PREP H 4 times daily since her procedure. She will take the Linzess as recommended. Does she need another cream?

## 2018-11-02 NOTE — Telephone Encounter (Signed)
Pt had procedure on 08/23/2018.  Pt says she has been seeing blood when she wipes after a bm.  She said that she seen blood in the actual stool as well.  Pt reports stomach pains also. [phone]

## 2018-11-02 NOTE — Telephone Encounter (Signed)
Have her see if the changes to her Linzess helps. If she has persistent rectal bleeding despite changes in Linzess we can send in Anusol. Just let me know and I'll send it in.

## 2018-11-02 NOTE — Telephone Encounter (Signed)
PT is aware and will let us know how she does.

## 2018-11-02 NOTE — Telephone Encounter (Signed)
Pt said she is taking Linzess 72 mcg daily and has one good Bm and most of the time another little bit. Her stools are runny since she has been on the Linzess.  She sees a little blood on tissue and some in comode.  The abdominal pain is in her lower stomach all the way across and she just has it when she is having a BM.  Forwarding to Walden Field, NP who last saw her in the office and to Dr. Oneida Alar who did procedure.

## 2018-11-03 NOTE — Telephone Encounter (Signed)
REVIEWED-NO ADDITIONAL RECOMMENDATIONS. 

## 2018-12-26 ENCOUNTER — Other Ambulatory Visit (HOSPITAL_COMMUNITY)
Admission: RE | Admit: 2018-12-26 | Discharge: 2018-12-26 | Disposition: A | Discharge status: Home / Self Care | Payer: Medicare Other | Source: Ambulatory Visit | Attending: Gastroenterology | Admitting: Gastroenterology

## 2018-12-26 ENCOUNTER — Encounter: Payer: Self-pay | Admitting: Gastroenterology

## 2018-12-26 ENCOUNTER — Ambulatory Visit (INDEPENDENT_AMBULATORY_CARE_PROVIDER_SITE_OTHER): Payer: Medicare Other | Admitting: Gastroenterology

## 2018-12-26 ENCOUNTER — Other Ambulatory Visit: Payer: Self-pay

## 2018-12-26 VITALS — BP 148/96 | HR 87 | Temp 96.9°F | Ht 64.0 in | Wt 264.6 lb

## 2018-12-26 DIAGNOSIS — K5900 Constipation, unspecified: Secondary | ICD-10-CM | POA: Diagnosis not present

## 2018-12-26 DIAGNOSIS — K625 Hemorrhage of anus and rectum: Secondary | ICD-10-CM

## 2018-12-26 DIAGNOSIS — R141 Gas pain: Secondary | ICD-10-CM

## 2018-12-26 DIAGNOSIS — R142 Eructation: Secondary | ICD-10-CM

## 2018-12-26 DIAGNOSIS — R143 Flatulence: Secondary | ICD-10-CM | POA: Diagnosis not present

## 2018-12-26 LAB — CBC WITH DIFFERENTIAL/PLATELET
Abs Immature Granulocytes: 0.01 10*3/uL (ref 0.00–0.07)
Basophils Absolute: 0.1 10*3/uL (ref 0.0–0.1)
Basophils Relative: 1 %
Eosinophils Absolute: 0.3 10*3/uL (ref 0.0–0.5)
Eosinophils Relative: 5 %
HCT: 42.3 % (ref 36.0–46.0)
Hemoglobin: 13 g/dL (ref 12.0–15.0)
Immature Granulocytes: 0 %
Lymphocytes Relative: 42 %
Lymphs Abs: 2.7 10*3/uL (ref 0.7–4.0)
MCH: 22.9 pg — ABNORMAL LOW (ref 26.0–34.0)
MCHC: 30.7 g/dL (ref 30.0–36.0)
MCV: 74.6 fL — ABNORMAL LOW (ref 80.0–100.0)
Monocytes Absolute: 0.7 10*3/uL (ref 0.1–1.0)
Monocytes Relative: 10 %
Neutro Abs: 2.7 10*3/uL (ref 1.7–7.7)
Neutrophils Relative %: 42 %
Platelets: 416 10*3/uL — ABNORMAL HIGH (ref 150–400)
RBC: 5.67 MIL/uL — ABNORMAL HIGH (ref 3.87–5.11)
RDW: 14.3 % (ref 11.5–15.5)
WBC: 6.5 10*3/uL (ref 4.0–10.5)
nRBC: 0 % (ref 0.0–0.2)

## 2018-12-26 MED ORDER — HYDROCORTISONE (PERIANAL) 2.5 % EX CREA: 1.0000 "application " | TOPICAL_CREAM | Freq: Four times a day (QID) | CUTANEOUS | 1 refills | Status: AC

## 2018-12-26 NOTE — Progress Notes (Signed)
Hemoglobin within normal limits at 13 and actually improved compared to August 2020.  Her rectal bleeding is not a significant amount to cause any drop in her hemoglobin which is reassuring.

## 2018-12-26 NOTE — Patient Instructions (Addendum)
Please have CBC completed.   I am sending in Anusol cream. Apply this per your rectum 4 times daily for the next 10 days. Please call and let me know how you are doing in 2 weeks.   Regarding abdominal bloating. Avoid foods that cause gas and bloating including broccoli, cabbage, cauliflower, baked beans, and carbonated drinks. See handout below.   You may add a probiotic daily. [employer] colon [facility] or digestive advantage are good choices.   Drink plenty of water to keep urine pale yellow to clear.   Continue Linzess 72 mcg daily.   We will see you back in 1 month. Call if questions or concerns prior.   Aliene Altes, PA-C [facility]       Abdominal Bloating When you have abdominal bloating, your abdomen may feel full, tight, or painful. It may also look bigger than normal or swollen (distended). Common causes of abdominal bloating include:  Swallowing air.  Constipation.  Problems digesting food.  Eating too much.  Irritable bowel syndrome. This is a condition that affects the large intestine.  Lactose intolerance. This is an inability to digest lactose, a natural sugar in dairy products.  Celiac disease. This is a condition that affects the ability to digest gluten, a protein found in some grains.  Gastroparesis. This is a condition that slows down the movement of food in the stomach and small intestine. It is more common in people with diabetes mellitus.  Gastroesophageal reflux disease (GERD). This is a digestive condition that makes stomach acid flow back into the esophagus.  Urinary retention. This means that the body is holding onto urine, and the bladder cannot be emptied all the way. Follow these instructions at home: Eating and drinking  Avoid eating too much.  Try not to swallow air while talking or eating.  Avoid eating while lying down.  Avoid these foods and drinks: ? Foods that cause gas, such as broccoli, cabbage, cauliflower,  and baked beans. ? Carbonated drinks. ? Hard candy. ? Chewing gum. Medicines  Take over-the-counter and prescription medicines only as told by your health care provider.  Take probiotic medicines. These medicines contain live bacteria or yeasts that can help digestion.  Take coated peppermint oil capsules. Activity  Try to exercise regularly. Exercise may help to relieve bloating that is caused by gas and relieve constipation. General instructions  Keep all follow-up visits as told by your health care provider. This is important. Contact a health care provider if:  You have nausea and vomiting.  You have diarrhea.  You have abdominal pain.  You have unusual weight loss or weight gain.  You have severe pain, and medicines do not help. Get help right away if:  You have severe chest pain.  You have trouble breathing.  You have shortness of breath.  You have trouble urinating.  You have darker urine than normal.  You have blood in your stools or have dark, tarry stools. Summary  Abdominal bloating means that the abdomen is swollen.  Common causes of abdominal bloating are swallowing air, constipation, and problems digesting food.  Avoid eating too much and avoid swallowing air.  Avoid foods that cause gas, carbonated drinks, hard candy, and chewing gum. This information is not intended to replace advice given to you by your health care provider. Make sure you discuss any questions you have with your health care provider. Document Released: 02/14/2016 Document Revised: 05/02/2018 Document Reviewed: 02/14/2016 Elsevier Patient Education  2020 [employer].

## 2018-12-26 NOTE — Progress Notes (Signed)
LMOM to call.

## 2018-12-26 NOTE — Assessment & Plan Note (Signed)
Symptoms are well controlled on Linzess 72 mcg daily.  Stools tend to be loose, but patient is fine with this.  Discussed trying Amitiza to see if this would help with her constipation but not cause loose stools; however, patient prefers to stay on Linzess.   Continue Linzess 72 mcg.

## 2018-12-26 NOTE — Progress Notes (Signed)
Referring Provider: Lucia Gaskins, MD Primary Care Physician:  Lucia Gaskins, MD Primary GI Physician: Dr. Oneida Alar  Chief Complaint  Patient presents with   Rectal Bleeding    3 days ago   Rectal Pain    HPI:   Autumn [name] is a 53 y.o. female presenting today with a history of constipation, on Linzess 72 mcg and intermittent rectal bleeding.  She was last seen in our office on 07/04/2018 for follow-up of constipation and rectal bleeding.  Her constipation was much improved with Linzess 72 mcg with stools soft/runny, lower abdominal pain essentially resolved, and no rectal bleeding since April 2020.  Plans to proceed with colonoscopy for evaluation of rectal bleeding.  Colonoscopy on 08/23/2018 with three 2-4 mm polyps in the rectum and sigmoid colon, external and internal hemorrhoids, tortuous left colon.  Pathology with 3 hyperplastic polyps.  Recommendations to follow high-fiber diet and repeat colonoscopy in 10 years.  Per Dr. Oneida Alar, recommendations to read and follow with Dr. Knox Saliva "10-day detox diet."  If unable to lose weight in the next year, call for referral to bariatric weight loss clinic.  Use Preparation H 4 times daily for hemorrhoids. Advised to see surgery if hemorrhoid symptoms not controlled with supportive measures.   Telephone note on 11/02/2018 with patient reporting a little blood on toilet tissue and some in the commode.  Stools are runny on Linzess typically with 1 good BM daily, and lower abdominal pain when having a BM.  She is advised to use Preparation H 4 times daily, open Linzess capsule in place and 4 teaspoons of water and take 3 teaspoons of liquid daily.  If she had persistent rectal bleeding, plans to send Anusol.   Today she states rectal bleeding slowed down for a while after October. After Thanksgiving, she had one day of heavy bleeding with blood in the toilet water. After that, she had a couple days of spotting with blood in the  toilet water and on toilet tissue. Bleeding has now resolved. Last day of rectal bleeding was two days ago. Having rectal pain. "Feels like someone kicked me in by butt." Started at the time of rectal bleeding. Present after having a BM. Using preparation H cream QID since bleeding started. With Linzess, she is having a BM daily. Stools are runny. Typically just 1 BM daily. Feels bloated with Linzess. No abdominal pain.   Lately, she has been feeling "a little off" when taking her medications in the morning. Somewhat of a dizzy feeling. Started 1 month ago. Present in the morning, last about 1 hour and resolves. No pre-syncope.   No nausea or vomiting. No GERD symptoms.   Patient did not want to have rectal exam today.   Past Medical History:  Diagnosis Date   Anxiety    Panic attacks- "cries"   Arthritis    Complication of anesthesia    had some hallucations   Fibroids, submucosal 12/02/2016   GERD (gastroesophageal reflux disease)    H/O colonoscopy    Hypertension    Rectal bleeding    Shortness of breath dyspnea    with exertion   Thickened endometrium 12/02/2016    Past Surgical History:  Procedure Laterality Date   BREAST LUMPECTOMY Right    CESAREAN SECTION     x3   COLONOSCOPY     COLONOSCOPY WITH PROPOFOL N/A 08/23/2018   PROPOFOL;  Surgeon: Danie Binder, MD; three 2-4 mm polyps in the rectum and sigmoid colon,  external and internal hemorrhoids, tortuous left colon.  Pathology with 3 hyperplastic polyps.  Next colonoscopy in 2030.   DENTAL SURGERY     MASS EXCISION N/A 12/06/2017   Procedure: EXCISION 1CM CYST ON FACE;  Surgeon: Virl Cagey, MD;  Location: [facility];  Service: General;  Laterality: N/A;   MULTIPLE EXTRACTIONS WITH ALVEOLOPLASTY Bilateral 04/12/2015   Procedure: BILATERAL MULTIPLE EXTRACTIONS NUMBERS TWENTY, TWENTY ONE, TWENTY TWO, TWENTY THREE, TWENTY FOUR, TWENTY FIVE, TWENTY EIGHT, TWENTY NINE WITH ALVEOLOPLASTY;  Surgeon: Diona Browner, DDS;  Location: [facility];  Service: Oral Surgery;  Laterality: Bilateral;   POLYPECTOMY  08/23/2018   Procedure: POLYPECTOMY;  Surgeon: Danie Binder, MD;  Location: [facility];  Service: Endoscopy;;   TUBAL LIGATION      Current Outpatient Medications  Medication Sig Dispense Refill   labetalol (NORMODYNE) 200 MG tablet Take 200 mg by mouth 2 (two) times daily.     linaclotide (LINZESS) 72 MCG capsule Take 1 capsule (72 mcg total) by mouth daily before breakfast. 30 capsule 11   LORazepam (ATIVAN) 1 MG tablet Take 1 mg by mouth at bedtime.   0   Multiple Vitamin (MULTIVITAMIN PO) Take 1 tablet by mouth daily.     naproxen (NAPROSYN) 500 MG tablet Take 500 mg by mouth 2 (two) times a day.      hydrocortisone (ANUSOL-HC) 2.5 % rectal cream Place 1 application rectally 4 (four) times daily for 10 days. 30 g 1   No current facility-administered medications for this visit.     Allergies as of 12/26/2018 - Review Complete 12/26/2018  Allergen Reaction Noted   Bee venom Anaphylaxis 11/04/2010   Tramadol Hives 11/04/2010   Asa [aspirin] Other (See Comments) 08/14/2015   Latex Itching 12/26/2018   Peppermint flavor Other (See Comments) 03/01/2018    Family History  Adopted: Yes  Problem Relation Age of Onset   Heart disease Other    Colon cancer Neg Hx     Social History   Socioeconomic History   Marital status: Significant Other    Spouse name: Not on file   Number of children: Not on file   Years of education: Not on file   Highest education level: Not on file  Occupational History   Not on file  Social Needs   Financial resource strain: Not on file   Food insecurity    Worry: Not on file    Inability: Not on file   Transportation needs    Medical: Not on file    Non-medical: Not on file  Tobacco Use   Smoking status: Current Every Day Smoker    Packs/day: 1.00    Years: 31.00    Pack years: 31.00    Types: Cigarettes   Smokeless  tobacco: Never Used  Substance and Sexual Activity   Alcohol use: Yes    Comment: occ   Drug use: No   Sexual activity: Not Currently    Birth control/protection: Surgical  Lifestyle   Physical activity    Days per week: Not on file    Minutes per session: Not on file   Stress: Not on file  Relationships   Social connections    Talks on phone: Not on file    Gets together: Not on file    Attends religious service: Not on file    Active member of club or organization: Not on file    Attends meetings of clubs or organizations: Not on file  Relationship status: Not on file  Other Topics Concern   Not on file  Social History Narrative   Not on file    Review of Systems: Gen: Denies fever, chills. Admits to hot flashes.  CV: Denies chest pain, palpitations Resp: Denies dyspnea at rest or cough GI: See HPI Derm: Denies rash Heme: See HPI  Physical Exam: BP (!) 148/96    Pulse 87    Temp (!) 96.9 F (36.1 C) (Temporal)    Ht 5\' 4"  (1.626 m)    Wt 264 lb 9.6 oz (120 kg)    LMP 11/11/2016 Comment: first period in over 1 year   BMI 45.42 kg/m  General:   Alert and oriented. No distress noted. Pleasant and cooperative.  Head:  Normocephalic and atraumatic. Eyes:  Conjuctiva clear without scleral icterus. Heart:  S1, S2 present without murmurs appreciated. Lungs:  Clear to auscultation bilaterally. No wheezes, rales, or rhonchi. No distress.  Abdomen:  +BS, soft, non-tender and non-distended. No rebound or guarding. No HSM or masses noted. Rectal: Patient declined rectal exam today. Msk:  Symmetrical without gross deformities. Normal posture. Extremities:  Without edema. Neurologic:  Alert and  oriented x4 Psych: Normal mood and affect.

## 2018-12-26 NOTE — Assessment & Plan Note (Addendum)
Patient has history of intermittent rectal bleeding with recent colonoscopy for further evaluation of hematochezia on 08/23/2018.  Colonoscopy revealed three hyperplastic polyps, external and internal hemorrhoids, tortuous left colon.  Suspected rectal bleeding secondary to hemorrhoids and advised to use Preparation H 4 times daily.  Advised to see surgery of hemorrhoid symptoms not controlled with supportive measures.  She had a few days of rectal bleeding in October that resolved until 1 day after Thanksgiving.  She reports 1 day of heavy bleeding with blood in the toilet water that was followed by couple days of spotting with blood in toilet water and on toilet tissue.  Bleeding has now resolved with last episode 2 days ago.  Also having some rectal pain that "feels like someone kicked me in my butt."  Rectal pain started at the time of rectal bleeding.  Has been using Preparation H cream 4 times daily since bleeding started.  Bowels are moving daily and are runny with Linzess 72 mcg daily.  Denies abdominal pain.  No unintentional weight loss, nausea, or vomiting.  Patient declined rectal exam.  Suspect rectal bleeding is likely secondary to known hemorrhoids.  Discussed treatment with [facility] cream; however, patient states she would not be able to afford this.  At this time, will treat with Anusol cream 4 times daily x10 days.  Patient is to call in 2 weeks with a progress report. CBC to ensure no significant blood loss.  Continue Linzess 72 mcg daily.  Discussed changing Linzess to Amitiza in light of runny stools; ever, patient desires to continue Linzess. Follow-up in 1 month. Advised patient if her symptoms do not improve, we will need to do a rectal exam at her next office visit, she was agreeable.  Discussed prior recommendations for surgery if hemorrhoid creams do not keep hemorrhoids symptoms well controlled.  Patient stated she will not undergo surgery.

## 2018-12-26 NOTE — Assessment & Plan Note (Signed)
Patient reports abdominal bloating in the setting of Linzess.  Discussed changing Linzess to Amitiza as Linzess also causes runny stools; however, patient desires to continue Linzess at this time.  Advise she avoid foods that cause abdominal bloating and gas.  Handout provided.  She is also advised to start a daily probiotic and drink plenty of water to keep her urine pale yellow to clear.

## 2019-01-04 ENCOUNTER — Ambulatory Visit: Payer: 59 | Admitting: Nurse Practitioner

## 2019-01-09 ENCOUNTER — Telehealth: Payer: Self-pay | Admitting: Gastroenterology

## 2019-01-09 NOTE — Telephone Encounter (Signed)
Spoke with pt. Pt is having sharp knife like pain when she has a BM that's been present since pt had her procedure. The Anusol has held the blood flow decrease and not the pain at rectum when pt has it.

## 2019-01-09 NOTE — Telephone Encounter (Signed)
Called and spoke with pt. Pt is feeling a sharp pain when she has a bowel movement. Pt doesn't have to strain when having a BM. Pt is taking Linzess 72 mcg daily as directed.  Pt is seeing some blood on her tissue when she wipes. Pt states that since she started using the Anusol, the bleeding has slowed down a lot. No blood is seen in the toilet.

## 2019-01-09 NOTE — Telephone Encounter (Signed)
Patient Called stating that she is still having some rectal bleeding and pain after starting the cream that was called in

## 2019-01-09 NOTE — Telephone Encounter (Signed)
Is this pain new? She reported rectal pain at her office visit, "like someone kicked me in my butt." If she is having sharp knife like pain with BMs, it is possible she has an anal fissure. Is she still using Anusol? I had advised she use this for 10 days. Did she have improvement in rectal pain after using Anusol.   I have a note from [facility] stating patient is requesting refill of Anusol. Will need to address the above prior to sending this in as she may need something different.

## 2019-01-09 NOTE — Telephone Encounter (Signed)
Noted. Patient has appointment on 12/30. She needs office visit sooner if possible as we will need to do a rectal exam to evaluate further. She may have an anal fissure causing her sharp rectal pain. This can also cause rectal bleeding.  Anusol will not heal this.

## 2019-01-10 NOTE — Telephone Encounter (Signed)
Noted. Spoke with pt. Apt was moved up to 01/13/2019 @ 10:30.

## 2019-01-12 NOTE — Progress Notes (Signed)
Referring Provider: Lucia Gaskins, MD Primary Care Physician:  Lucia Gaskins, MD Primary GI Physician: Dr. Oneida Alar  Chief Complaint  Patient presents with  . Rectal Bleeding    last episode after thanksgiving  . Rectal Pain    HPI:   CELLIE TASSEY is a 53 y.o. female presenting today with a history of constipation on Linzess 72 mcg and intermittent rectal bleeding.  She underwent colonoscopy on 08/23/2018 for rectal bleeding which revealed three 2-4 mm polyps in the rectum and sigmoid colon, external and internal hemorrhoids, tortuous left colon.  Pathology with 3 hyperplastic polyps.  Repeat colonoscopy in 10 years.    She is presenting today for follow-up of rectal bleeding and rectal pain.   Last seen in our office for follow-up of rectal bleeding, constipation, and concerns about rectal pain which started at the time of rectal bleeding.  Rectal bleeding had resolved 2 days prior to office visit but rectal pain was still present after having a BM.  Linzess was producing 1 runny BM daily.  She endorsed bloating with Linzess.  Discussed changing Linzess to Amitiza but patient declined.  She was advised to avoid foods that cause abdominal bloating and gas, and a daily probiotic and drink plenty of water.  Regarding rectal bleeding and rectal pain, patient declined rectal exam.  Discussed sending in [place] [facility] cream but patient unable to afford.  Sent in Anusol cream for patient to try.  Discussed referral to surgery but patient stated she would not undergo surgery.  Update CBC.  CBC on 12/26/2018 with hemoglobin 13.0.  Patient called on 01/09/2019 reporting sharp rectal pain with bowel movements and blood on toilet tissue when wiping.  Reported Anusol helped the bleeding slowed down a lot but pain was still present.  Requested patient's appointment get moved up as she needed a rectal exam to evaluate further since this was not completed at her last visit.    Today: Last episode of rectal bleeding was 4 days ago. Blood was "dripping" into the toilet around Thanksgiving, but this slowed down significantly after starting the Anusol cream. Rectal pain is continuing. Pain is sharp. Sitting in the chair now pain is sharp. Anusol cream does help the pain after about 1 hour. Using cream 4 times a day. Pain will return around the time she is going to bed. Pain is after a BM. Not specifically during a BM. One loose stool a day with Linzess 72 mcg. May have up to 3 BMs a day. No straining. She is happy with this.  Doesn't want to change the Linzess. She does do sitz baths. On the toilet for a while at times. Legs will get numb. No abdominal pain. No unintentional weight loss. No nausea or vomiting. No reflux symptoms or dysphagia. Doesn't want to have surgery if she doesn't have to.   Past Medical History:  Diagnosis Date  . Anxiety    Panic attacks- "cries"  . Arthritis   . Complication of anesthesia    had some hallucations  . Fibroids, submucosal 12/02/2016  . GERD (gastroesophageal reflux disease)   . H/O colonoscopy   . Hypertension   . Rectal bleeding   . Shortness of breath dyspnea    with exertion  . Thickened endometrium 12/02/2016    Past Surgical History:  Procedure Laterality Date  . BREAST LUMPECTOMY Right   . CESAREAN SECTION     x3  . COLONOSCOPY    . COLONOSCOPY WITH PROPOFOL N/A 08/23/2018  PROPOFOL;  Surgeon: Danie Binder, MD; three 2-4 mm polyps in the rectum and sigmoid colon, external and internal hemorrhoids, tortuous left colon.  Pathology with 3 hyperplastic polyps.  Next colonoscopy in 2030.  [facility] DENTAL SURGERY    . MASS EXCISION N/A 12/06/2017   Procedure: EXCISION 1CM CYST ON FACE;  Surgeon: Virl Cagey, MD;  Location: [facility];  Service: General;  Laterality: N/A;  . MULTIPLE EXTRACTIONS WITH ALVEOLOPLASTY Bilateral 04/12/2015   Procedure: BILATERAL MULTIPLE EXTRACTIONS NUMBERS TWENTY, TWENTY ONE, TWENTY TWO,  TWENTY THREE, TWENTY FOUR, TWENTY FIVE, TWENTY EIGHT, TWENTY NINE WITH ALVEOLOPLASTY;  Surgeon: Diona Browner, DDS;  Location: [place];  Service: Oral Surgery;  Laterality: Bilateral;  . POLYPECTOMY  08/23/2018   Procedure: POLYPECTOMY;  Surgeon: Danie Binder, MD;  Location: [facility];  Service: Endoscopy;;  . TUBAL LIGATION      Current Outpatient Medications  Medication Sig Dispense Refill  . labetalol (NORMODYNE) 200 MG tablet Take 200 mg by mouth 2 (two) times daily.    Marland Kitchen linaclotide (LINZESS) 72 MCG capsule Take 1 capsule (72 mcg total) by mouth daily before breakfast. 30 capsule 11  . LORazepam (ATIVAN) 1 MG tablet Take 1 mg by mouth at bedtime.   0  . Multiple Vitamin (MULTIVITAMIN PO) Take 1 tablet by mouth daily.    . naproxen (NAPROSYN) 500 MG tablet Take 500 mg by mouth 2 (two) times a day.     . Starch (ANUSOL RE) Place rectally as needed.    . Nitroglycerin (RECTIV) 0.4 % OINT Place 1 inch rectally 2 (two) times daily. 30 g 1   No current facility-administered medications for this visit.    Allergies as of 01/13/2019 - Review Complete 01/13/2019  Allergen Reaction Noted  . Bee venom Anaphylaxis 11/04/2010  . Tramadol Hives 11/04/2010  . Asa [aspirin] Other (See Comments) 08/14/2015  . Latex Itching 12/26/2018  . Peppermint flavor Other (See Comments) 03/01/2018    Family History  Adopted: Yes  Problem Relation Age of Onset  . Heart disease Other   . Colon cancer Neg Hx     Social History   Socioeconomic History  . Marital status: Significant Other    Spouse name: Not on file  . Number of children: Not on file  . Years of education: Not on file  . Highest education level: Not on file  Occupational History  . Not on file  Tobacco Use  . Smoking status: Current Every Day Smoker    Packs/day: 1.00    Years: 31.00    Pack years: 31.00    Types: Cigarettes  . Smokeless tobacco: Never Used  Substance and Sexual Activity  . Alcohol use: Yes    Comment:  occ  . Drug use: No  . Sexual activity: Not Currently    Birth control/protection: Surgical  Other Topics Concern  . Not on file  Social History Narrative  . Not on file   Social Determinants of Health   Financial Resource Strain:   . Difficulty of Paying Living Expenses: Not on file  Food Insecurity:   . Worried About Charity fundraiser in the Last Year: Not on file  . Ran Out of Food in the Last Year: Not on file  Transportation Needs:   . Lack of Transportation (Medical): Not on file  . Lack of Transportation (Non-Medical): Not on file  Physical Activity:   . Days of Exercise per Week: Not on file  . Minutes of  Exercise per Session: Not on file  Stress:   . Feeling of Stress : Not on file  Social Connections:   . Frequency of Communication with Friends and Family: Not on file  . Frequency of Social Gatherings with Friends and Family: Not on file  . Attends Religious Services: Not on file  . Active Member of Clubs or Organizations: Not on file  . Attends Archivist Meetings: Not on file  . Marital Status: Not on file    Review of Systems: Gen: Denies fever, chills, lightheadedness, dizziness, presyncope, or syncope. CV: Denies chest pain or palpitations Resp: Denies shortness of breath or cough. GI: See HPI Derm: Denies rash Psych: Denies depression, anxiety.  Heme: See HPI  Physical Exam: BP 125/88   Pulse 93   Temp 97.9 F (36.6 C) (Oral)   Ht 5\' 4"  (1.626 m)   Wt 264 lb (119.7 kg)   LMP 11/11/2016 Comment: first period in over 1 year  BMI 45.32 kg/m  General:   Alert and oriented. No distress noted. Pleasant and cooperative.  Head:  Normocephalic and atraumatic. Eyes:  Conjuctiva clear without scleral icterus. Heart:  S1, S2 present without murmurs appreciated. Lungs:  Clear to auscultation bilaterally. No wheezes, rales, or rhonchi. No distress.  Abdomen:  +BS, soft, non-tender and non-distended. No rebound or guarding. No HSM or masses  noted. Rectal: Nonthrombosed external and internal hemorrhoids.  No obvious anal fissure but she does have pain during internal exam.  No fluctuation to suggest abscess or obvious masses.  No blood on exam. Msk:  Symmetrical without gross deformities. Normal posture. Extremities:  Without edema. Neurologic:  Alert and  oriented x4 Psych: Normal mood and affect.

## 2019-01-13 ENCOUNTER — Ambulatory Visit (INDEPENDENT_AMBULATORY_CARE_PROVIDER_SITE_OTHER): Payer: Medicare Other | Admitting: Gastroenterology

## 2019-01-13 ENCOUNTER — Other Ambulatory Visit: Payer: Self-pay

## 2019-01-13 ENCOUNTER — Encounter: Payer: Self-pay | Admitting: Gastroenterology

## 2019-01-13 VITALS — BP 125/88 | HR 93 | Temp 97.9°F | Ht 64.0 in | Wt 264.0 lb

## 2019-01-13 DIAGNOSIS — K6289 Other specified diseases of anus and rectum: Secondary | ICD-10-CM | POA: Diagnosis not present

## 2019-01-13 DIAGNOSIS — K625 Hemorrhage of anus and rectum: Secondary | ICD-10-CM | POA: Diagnosis not present

## 2019-01-13 MED ORDER — RECTIV 0.4 % RE OINT: 1.0000 [in_us] | TOPICAL_OINTMENT | Freq: Two times a day (BID) | RECTAL | 1 refills | Status: DC

## 2019-01-13 NOTE — Assessment & Plan Note (Signed)
Addressed under rectal pain.  °

## 2019-01-13 NOTE — Patient Instructions (Addendum)
I am sending in Rectiv (nitroglycerine) ointment. You will apply 1 inch per your rectum twice a day for the next 3 weeks. Please call in 2 weeks with an update on your symptoms. If no improvement, we will have to revisit the referral to surgery.   You may pick up over the counter lidocaine cream to help manage pain. This will not heal anything but will help with your symptom of pain.   Stop Anusol cream for now as you are not having any rectal bleeding. You may use this as needed.   Please add benefiber or metamucil daily.   Use sitz baths 3-4 times daily.   Limit toilet time to 2-3 minutes.   Call in 2 weeks with an update on your symptoms. Otherwise, we will follow up in the office in 3 months.   Autumn Altes, PA-C [facility]      Anal Fissure, Adult  An anal fissure is a small tear or crack in the tissue around the opening of the butt (anus). Bleeding from the tear or crack usually stops on its own within a few minutes. The bleeding may happen every time you poop (have a bowel movement) until the tear or crack heals. What are the causes? This condition is usually caused by passing a large or hard poop (stool). Other causes include:  Trouble pooping (constipation).  Passing watery poop (diarrhea).  Inflammatory bowel disease (Crohn's disease or ulcerative colitis).  Childbirth.  Infections.  Anal sex. What are the signs or symptoms? Symptoms of this condition include:  Bleeding from the butt.  Small amounts of blood on your poop. The blood coats the outside of the poop. It is not mixed with the poop.  Small amounts of blood on the toilet paper or in the toilet after you poop.  Pain when passing poop.  Itching or irritation around the opening of the butt. How is this diagnosed? This condition may be diagnosed based on a physical exam. Your doctor may:  Check your butt. A tear can often be seen by checking the area with care.  Check your butt  using a short tube (anoscope). The light in the tube will show any problems in your butt. How is this treated? Treatment for this condition may include:  Treating problems that make it hard for you to pass poop. You may be told to: ? Eat more fiber. ? Drink more fluid. ? Take fiber supplements. ? Take medicines that make poop soft.  Taking sitz baths. This may help to heal the tear.  Using creams and ointments. If your condition gets worse, other treatments may be needed such as:  A shot near the tear or crack (botulinum injection).  Surgery to repair the tear or crack. Follow these instructions at home: Eating and drinking   Avoid bananas and dairy products. These foods can make it hard to poop.  Drink enough fluid to keep your pee (urine) pale yellow.  Eat foods that have a lot of fiber in them, such as: ? Beans. ? Whole grains. ? Fresh fruits. ? Fresh vegetables. General instructions   Take over-the-counter and prescription medicines only as told by your doctor.  Use creams or ointments only as told by your doctor.  Keep the butt area as clean and dry as you can.  Take a warm water bath (sitz bath) as told by your doctor. Do not use soap.  Keep all follow-up visits as told by your doctor. This is important. Contact a  doctor if:  You have more bleeding.  You have a fever.  You have watery poop that is mixed with blood.  You have pain.  Your problem gets worse, not better. Summary  An anal fissure is a small tear or crack in the skin around the opening of the butt (anus).  This condition is usually caused by passing a large or hard poop (stool).  Treatment includes treating the problems that make it hard for you to pass poop.  Follow your doctor's instructions about caring for your condition at home.  Keep all follow-up visits as told by your doctor. This is important. This information is not intended to replace advice given to you by your health care  provider. Make sure you discuss any questions you have with your health care provider. Document Released: 09/10/2010 Document Revised: 06/24/2017 Document Reviewed: 06/24/2017 Elsevier Patient Education  2020 [employer].  Hemorrhoids Hemorrhoids are swollen veins that may develop:  In the butt (rectum). These are called internal hemorrhoids.  Around the opening of the butt (anus). These are called external hemorrhoids. Hemorrhoids can cause pain, itching, or bleeding. Most of the time, they do not cause serious problems. They usually get better with diet changes, lifestyle changes, and other home treatments. What are the causes? This condition may be caused by:  Having trouble pooping (constipation).  Pushing hard (straining) to poop.  Watery poop (diarrhea).  Pregnancy.  Being very overweight (obese).  Sitting for long periods of time.  Heavy lifting or other activity that causes you to strain.  Anal sex.  Riding a bike for a long period of time. What are the signs or symptoms? Symptoms of this condition include:  Pain.  Itching or soreness in the butt.  Bleeding from the butt.  Leaking poop.  Swelling in the area.  One or more lumps around the opening of your butt. How is this diagnosed? A doctor can often diagnose this condition by looking at the affected area. The doctor may also:  Do an exam that involves feeling the area with a gloved hand (digital rectal exam).  Examine the area inside your butt using a small tube (anoscope).  Order blood tests. This may be done if you have lost a lot of blood.  Have you get a test that involves looking inside the colon using a flexible tube with a camera on the end (sigmoidoscopy or colonoscopy). How is this treated? This condition can usually be treated at home. Your doctor may tell you to change what you eat, make lifestyle changes, or try home treatments. If these do not help, procedures can be done to remove the  hemorrhoids or make them smaller. These may involve:  Placing rubber bands at the base of the hemorrhoids to cut off their blood supply.  Injecting medicine into the hemorrhoids to shrink them.  Shining a type of light energy onto the hemorrhoids to cause them to fall off.  Doing surgery to remove the hemorrhoids or cut off their blood supply. Follow these instructions at home: Eating and drinking   Eat foods that have a lot of fiber in them. These include whole grains, beans, nuts, fruits, and vegetables.  Ask your doctor about taking products that have added fiber (fibersupplements).  Reduce the amount of fat in your diet. You can do this by: ? Eating low-fat dairy products. ? Eating less red meat. ? Avoiding processed foods.  Drink enough fluid to keep your pee (urine) pale yellow. Managing pain and  swelling   Take a warm-water bath (sitz bath) for 20 minutes to ease pain. Do this 3-4 times a day. You may do this in a bathtub or using a portable sitz bath that fits over the toilet.  If told, put ice on the painful area. It may be helpful to use ice between your warm baths. ? Put ice in a plastic bag. ? Place a towel between your skin and the bag. ? Leave the ice on for 20 minutes, 2-3 times a day. General instructions  Take over-the-counter and prescription medicines only as told by your doctor. ? Medicated creams and medicines may be used as told.  Exercise often. Ask your doctor how much and what kind of exercise is best for you.  Go to the bathroom when you have the urge to poop. Do not wait.  Avoid pushing too hard when you poop.  Keep your butt dry and clean. Use wet toilet paper or moist towelettes after pooping.  Do not sit on the toilet for a long time.  Keep all follow-up visits as told by your doctor. This is important. Contact a doctor if you:  Have pain and swelling that do not get better with treatment or medicine.  Have trouble pooping.  Cannot  poop.  Have pain or swelling outside the area of the hemorrhoids. Get help right away if you have:  Bleeding that will not stop. Summary  Hemorrhoids are swollen veins in the butt or around the opening of the butt.  They can cause pain, itching, or bleeding.  Eat foods that have a lot of fiber in them. These include whole grains, beans, nuts, fruits, and vegetables.  Take a warm-water bath (sitz bath) for 20 minutes to ease pain. Do this 3-4 times a day. This information is not intended to replace advice given to you by your health care provider. Make sure you discuss any questions you have with your health care provider. Document Released: 10/22/2007 Document Revised: 01/20/2018 Document Reviewed: 06/03/2017 Elsevier Patient Education  [place] Fissure, Adult  An anal fissure is a small tear or crack in the tissue around the opening of the butt (anus). Bleeding from the tear or crack usually stops on its own within a few minutes. The bleeding may happen every time you poop (have a bowel movement) until the tear or crack heals. What are the causes? This condition is usually caused by passing a large or hard poop (stool). Other causes include:  Trouble pooping (constipation).  Passing watery poop (diarrhea).  Inflammatory bowel disease (Crohn's disease or ulcerative colitis).  Childbirth.  Infections.  Anal sex. What are the signs or symptoms? Symptoms of this condition include:  Bleeding from the butt.  Small amounts of blood on your poop. The blood coats the outside of the poop. It is not mixed with the poop.  Small amounts of blood on the toilet paper or in the toilet after you poop.  Pain when passing poop.  Itching or irritation around the opening of the butt. How is this diagnosed? This condition may be diagnosed based on a physical exam. Your doctor may:  Check your butt. A tear can often be seen by checking the area with care.  Check your  butt using a short tube (anoscope). The light in the tube will show any problems in your butt. How is this treated? Treatment for this condition may include:  Treating problems that make it hard for you to pass poop.  You may be told to: ? Eat more fiber. ? Drink more fluid. ? Take fiber supplements. ? Take medicines that make poop soft.  Taking sitz baths. This may help to heal the tear.  Using creams and ointments. If your condition gets worse, other treatments may be needed such as:  A shot near the tear or crack (botulinum injection).  Surgery to repair the tear or crack. Follow these instructions at home: Eating and drinking   Avoid bananas and dairy products. These foods can make it hard to poop.  Drink enough fluid to keep your pee (urine) pale yellow.  Eat foods that have a lot of fiber in them, such as: ? Beans. ? Whole grains. ? Fresh fruits. ? Fresh vegetables. General instructions   Take over-the-counter and prescription medicines only as told by your doctor.  Use creams or ointments only as told by your doctor.  Keep the butt area as clean and dry as you can.  Take a warm water bath (sitz bath) as told by your doctor. Do not use soap.  Keep all follow-up visits as told by your doctor. This is important. Contact a doctor if:  You have more bleeding.  You have a fever.  You have watery poop that is mixed with blood.  You have pain.  Your problem gets worse, not better. Summary  An anal fissure is a small tear or crack in the skin around the opening of the butt (anus).  This condition is usually caused by passing a large or hard poop (stool).  Treatment includes treating the problems that make it hard for you to pass poop.  Follow your doctor's instructions about caring for your condition at home.  Keep all follow-up visits as told by your doctor. This is important. This information is not intended to replace advice given to you by your health  care provider. Make sure you discuss any questions you have with your health care provider. Document Released: 09/10/2010 Document Revised: 06/24/2017 Document Reviewed: 06/24/2017 Elsevier Patient Education  2020 [employer].

## 2019-01-13 NOTE — Assessment & Plan Note (Addendum)
Patient reports ongoing rectal pain since her colonoscopy in July 2020.  Colonoscopy was performed for rectal bleeding and revealed three 2-4 mm polyps in the rectum and sigmoid colon, external and internal hemorrhoids, tortuous left colon.  Pathology with hyperplastic polyps with recommendations to repeat colonoscopy in 10 years.  She was advised to see surgery if hemorrhoid symptoms were not controlled with creams.  Patient was also having ongoing rectal bleeding but at last office visit on 12/26/2018, she was prescribed Anusol which has resolved rectal bleeding; however, sharp rectal pain continues.  Pain after BMs but also between BMs.  Anusol cream does help this some but is not resolving the symptoms.  No constipation on Linzess 72 mcg.  She does admit to being on the toilet for a while at times with her legs going numb.  Rectal exam with non-thrombosed external and internal hemorrhoids.  No obvious anal fissure but she does have pain during internal exam.  No fluctuations to suggest abscess or obvious masses. No blood on exam.  At this time, will empirically treat patient for an anal fissure as we have treated her for hemorrhoids but pain is not improving. Patient wants to avoid surgery. She is unable to afford compounded creams.  We will send in Rectiv twice daily x3 weeks.  Discussed side effects of Rectiv including headache and dizziness/low blood pressure. She was advised to lay down when applying this medication and monitor for symptoms.  Use over-the-counter lidocaine cream for symptomatic relief.   As rectal bleeding has resolved, she is to stop using Anusol cream for now and use as needed.   Add benefiber or metamucil daily.  Sitz baths 3-4 times daily.  Limit toilet time to 2-3 minutes.  Asked for a progress report in 2 weeks.  Discussed if she does not have improvement in symptoms with creams/supportive measures, we will have to revisit the referral to surgery. Follow-up in 3 months

## 2019-01-25 ENCOUNTER — Ambulatory Visit: Payer: Medicare Other | Admitting: Gastroenterology

## 2019-02-22 ENCOUNTER — Telehealth: Payer: Self-pay

## 2019-02-22 DIAGNOSIS — K5900 Constipation, unspecified: Secondary | ICD-10-CM

## 2019-02-22 DIAGNOSIS — R1030 Lower abdominal pain, unspecified: Secondary | ICD-10-CM

## 2019-02-22 MED ORDER — LINACLOTIDE 72 MCG PO CAPS: 72.0000 ug | ORAL_CAPSULE | Freq: Every day | ORAL | 3 refills | Status: DC

## 2019-02-22 NOTE — Telephone Encounter (Signed)
Pt is aware Rx has been sent in.  

## 2019-02-22 NOTE — Telephone Encounter (Signed)
Pt has been getting Linzess 72 mcg locally, but would like to start getting it at [facility] order.  I have entered the [place].  Forwarding to refill box.

## 2019-02-22 NOTE — Addendum Note (Signed)
Addended by: Gordy Levan, Jacquelyne Quarry A on: 02/22/2019 11:10 AM   Modules accepted: Orders

## 2019-02-22 NOTE — Telephone Encounter (Signed)
Rx sent per request. 90 day supply sent to mail order pharmacy.

## 2019-02-22 NOTE — Telephone Encounter (Signed)
Routing to refill box  

## 2019-03-13 ENCOUNTER — Telehealth: Payer: Self-pay | Admitting: Advanced Practice Midwife

## 2019-03-13 NOTE — Telephone Encounter (Signed)

## 2019-03-14 ENCOUNTER — Other Ambulatory Visit (HOSPITAL_COMMUNITY)
Admission: RE | Admit: 2019-03-14 | Discharge: 2019-03-14 | Disposition: A | Discharge status: Home / Self Care | Payer: Medicare HMO | Source: Ambulatory Visit | Attending: Advanced Practice Midwife | Admitting: Advanced Practice Midwife

## 2019-03-14 ENCOUNTER — Other Ambulatory Visit: Payer: Self-pay

## 2019-03-14 ENCOUNTER — Ambulatory Visit (INDEPENDENT_AMBULATORY_CARE_PROVIDER_SITE_OTHER): Payer: Medicare HMO | Admitting: Advanced Practice Midwife

## 2019-03-14 ENCOUNTER — Encounter: Payer: Self-pay | Admitting: Advanced Practice Midwife

## 2019-03-14 VITALS — BP 119/85 | HR 89 | Ht 64.0 in | Wt 262.0 lb

## 2019-03-14 DIAGNOSIS — Z01419 Encounter for gynecological examination (general) (routine) without abnormal findings: Secondary | ICD-10-CM

## 2019-03-14 DIAGNOSIS — Z1151 Encounter for screening for human papillomavirus (HPV): Secondary | ICD-10-CM | POA: Diagnosis not present

## 2019-03-14 DIAGNOSIS — Z780 Asymptomatic menopausal state: Secondary | ICD-10-CM | POA: Insufficient documentation

## 2019-03-14 DIAGNOSIS — Z124 Encounter for screening for malignant neoplasm of cervix: Secondary | ICD-10-CM

## 2019-03-14 MED ORDER — CLOTRIMAZOLE-BETAMETHASONE 1-0.05 % EX CREA: 1.0000 "application " | TOPICAL_CREAM | Freq: Two times a day (BID) | CUTANEOUS | 1 refills | Status: DC

## 2019-03-14 NOTE — Progress Notes (Signed)
Patient ID: GRACYNN WONDERLY, female   DOB: 1965/06/13, 54 y.o.   MRN: [id number]     [facility] EXAMINATION Patient name: BREINA GEER MRN [id number]  Date of birth: 10/24/1965 Chief Complaint:   Gynecologic Exam  History of Present Illness:   ROSSANNA HERDON is a 54 y.o. [id number] African American female being seen today for a routine well-woman exam.  Current complaints: rash on breasts x 2 mos; vag d/c- no itching  PCP: Dr Cindie Laroche      does not desire labs Patient's last menstrual period was 11/11/2016. The current method of family planning is post menopausal status.  Last pap not sure.  Last mammogram: been awhile. Results were: normal. Family h/o breast cancer: No Last colonoscopy: never. Family h/o colorectal cancer: No Past Medical History:  Diagnosis Date  . Anxiety    Panic attacks- "cries"  . Arthritis   . Complication of anesthesia    had some hallucations  . Fibroids, submucosal 12/02/2016  . GERD (gastroesophageal reflux disease)   . H/O colonoscopy   . Hypertension   . Rectal bleeding   . Shortness of breath dyspnea    with exertion  . Thickened endometrium 12/02/2016   Past Surgical History:  Procedure Laterality Date  . BREAST LUMPECTOMY Right   . CESAREAN SECTION     x3  . COLONOSCOPY    . COLONOSCOPY WITH PROPOFOL N/A 08/23/2018   PROPOFOL;  Surgeon: Danie Binder, MD; three 2-4 mm polyps in the rectum and sigmoid colon, external and internal hemorrhoids, tortuous left colon.  Pathology with 3 hyperplastic polyps.  Next colonoscopy in 2030.  Marland Kitchen DENTAL SURGERY    . MASS EXCISION N/A 12/06/2017   Procedure: EXCISION 1CM CYST ON FACE;  Surgeon: Virl Cagey, MD;  Location: [facility];  Service: General;  Laterality: N/A;  . MULTIPLE EXTRACTIONS WITH ALVEOLOPLASTY Bilateral 04/12/2015   Procedure: BILATERAL MULTIPLE EXTRACTIONS NUMBERS TWENTY, TWENTY ONE, TWENTY TWO, TWENTY THREE, TWENTY FOUR, TWENTY FIVE, TWENTY EIGHT, TWENTY NINE WITH  ALVEOLOPLASTY;  Surgeon: Diona Browner, DDS;  Location: [facility];  Service: Oral Surgery;  Laterality: Bilateral;  . POLYPECTOMY  08/23/2018   Procedure: POLYPECTOMY;  Surgeon: Danie Binder, MD;  [facility]: [facility];  Service: Endoscopy;;  . TUBAL LIGATION     OB History    Gravida  3   Para  3   Term  3   Preterm      AB      Living  3     SAB      TAB      Ectopic      Multiple      Live Births           Obstetric Comments  Unsure of middle childs dob, child was adopted.         Allergies  Allergen Reactions  . Bee Venom Anaphylaxis  . Tramadol Hives  . Asa [Aspirin] Other (See Comments)    Nose bleed  . Latex Itching  . Peppermint Flavor Other (See Comments)    Does not like taste   Social History   Socioeconomic History  . Marital status: Significant Other    Spouse name: Not on file  . Number of children: Not on file  . Years of education: Not on file  . Highest education level: Not on file  Occupational History  . Not on file  Tobacco Use  . Smoking status: Current Every Day Smoker  Packs/day: 1.00    Years: 31.00    Pack years: 31.00    Types: Cigarettes  . Smokeless tobacco: Never Used  Substance and Sexual Activity  . Alcohol use: Yes    Comment: occ  . Drug use: No  . Sexual activity: Yes    Birth control/protection: Surgical    Comment: tubal  Other Topics Concern  . Not on file  Social History Narrative  . Not on file   Social Determinants of Health   Financial Resource Strain:   . Difficulty of Paying Living Expenses: Not on file  Food Insecurity:   . Worried About Charity fundraiser in the Last Year: Not on file  . Ran Out of Food in the Last Year: Not on file  Transportation Needs:   . Lack of Transportation (Medical): Not on file  . Lack of Transportation (Non-Medical): Not on file  Physical Activity:   . Days of Exercise per Week: Not on file  . Minutes of Exercise per Session: Not on file  Stress:   .  Feeling of Stress : Not on file  Social Connections:   . Frequency of Communication with Friends and Family: Not on file  . Frequency of Social Gatherings with Friends and Family: Not on file  . Attends Religious Services: Not on file  . Active Member of Clubs or Organizations: Not on file  . Attends Archivist Meetings: Not on file  . Marital Status: Not on file     Review of Systems:   Pertinent items are noted in HPI Denies any headaches, blurred vision, fatigue, shortness of breath, chest pain, abdominal pain, abnormal vaginal discharge/itching/odor/irritation, problems with periods, bowel movements, urination, or intercourse unless otherwise stated above. Pertinent History Reviewed:  Reviewed past medical,surgical, social and family history.  Reviewed problem list, medications and allergies. Physical Assessment:   Vitals:   03/14/19 1021  BP: 119/85  Pulse: 89  Weight: 118.8 kg  Height: 5\' 4"  (1.626 m)  Body mass index is 44.97 kg/m.        Physical Examination:   General appearance - well appearing, and in no distress  Mental status - alert, oriented to person, place, and time  Psych:  She has a normal mood and affect  Skin - warm and dry, normal color, no suspicious lesions noted  Chest - effort normal, all lung fields clear to auscultation bilaterally  Heart - normal rate and regular rhythm  Neck:  midline trachea, no thyromegaly or nodules  Breasts - breasts appear normal, no suspicious masses, no skin or nipple changes or axillary nodes; bilat rash, nonpruritic  Abdomen - soft, nontender, nondistended, no masses or organomegaly; difficult to assess 2/2 habitus  Pelvic - VULVA: normal appearing vulva with no masses, tenderness or lesions  VAGINA: normal appearing vagina with normal color and discharge, no lesions  CERVIX: normal appearing cervix without discharge or lesions, no CMT  Thin prep pap is done with HR HPV cotesting  UTERUS: uterus is felt to be  normal size, shape, consistency and nontender   ADNEXA: No adnexal masses or tenderness noted.  Extremities:  No swelling or varicosities noted  Chaperone: Latisha Cresenzo    No results found for this or any previous visit (from the past 24 hour(s)).  Assessment & Plan:  1) Well-Woman Exam  2) Morbid obesity  3) Dermatitis on bilat breasts> rx Lotrisone    Labs/procedures today: Pap & CV swab  Mammogram ASAP or sooner if problems  Colonoscopy ASAP or sooner if problems  No orders of the defined types were placed in this encounter.   Meds:  Meds ordered this encounter  Medications  . clotrimazole-betamethasone (LOTRISONE) cream    Sig: Apply 1 application topically 2 (two) times daily.    Dispense:  30 g    Refill:  1    Order Specific Question:   Supervising Provider    Answer:   Jonnie Kind [[id number]]    Follow-up: Return in about 1 year (around 03/13/2020).  TASHIEKA OSTHOFF CNM 03/15/2019 11:48 PM

## 2019-03-14 NOTE — Patient Instructions (Signed)

## 2019-03-16 LAB — CERVICOVAGINAL ANCILLARY ONLY
Bacterial Vaginitis (gardnerella): POSITIVE — AB
Candida Glabrata: NEGATIVE
Candida Vaginitis: NEGATIVE
Comment: NEGATIVE
Comment: NEGATIVE
Comment: NEGATIVE

## 2019-03-17 LAB — CYTOLOGY - PAP
Chlamydia: NEGATIVE
Comment: NEGATIVE
Comment: NEGATIVE
Comment: NORMAL
Diagnosis: NEGATIVE
High risk HPV: NEGATIVE
Neisseria Gonorrhea: NEGATIVE

## 2019-03-20 ENCOUNTER — Other Ambulatory Visit: Payer: Self-pay | Admitting: Advanced Practice Midwife

## 2019-03-20 DIAGNOSIS — A5901 Trichomonal vulvovaginitis: Secondary | ICD-10-CM | POA: Insufficient documentation

## 2019-03-20 DIAGNOSIS — B9689 Other specified bacterial agents as the cause of diseases classified elsewhere: Secondary | ICD-10-CM | POA: Insufficient documentation

## 2019-03-20 MED ORDER — METRONIDAZOLE 500 MG PO TABS: 500.0000 mg | ORAL_TABLET | Freq: Two times a day (BID) | ORAL | 0 refills | Status: DC

## 2019-03-22 ENCOUNTER — Telehealth: Payer: Self-pay | Admitting: *Deleted

## 2019-03-22 NOTE — Telephone Encounter (Signed)
Patient made aware of normal pap but positive for trich.  Swab positive for BV.  Will need treatment for both with Metronidazole.  Partner needs treatment as well.  Advised no sex x 1 week or alcohol and will need POC in 3-4 weeks.  Pt states she is having trouble with her car at this time so she call back to ger scheduled for POC.  Medication for partner called in to [facility] for [employer], DOB 08/24/70.

## 2019-03-27 ENCOUNTER — Other Ambulatory Visit: Payer: Self-pay | Admitting: Advanced Practice Midwife

## 2019-04-13 ENCOUNTER — Ambulatory Visit: Payer: Medicare Other | Admitting: Gastroenterology

## 2019-04-14 ENCOUNTER — Ambulatory Visit: Payer: Medicare HMO | Admitting: Gastroenterology

## 2019-04-17 NOTE — Progress Notes (Signed)
Referring Provider: Lucia Gaskins, MD Primary Care Physician:  Lucia Gaskins, MD Primary GI Physician: Dr. Oneida Alar  Chief Complaint  Patient presents with  . Rectal Pain    f/u, doing ok    HPI:   Autumn [name] is a 54 y.o. female presenting today for follow-up of rectal pain and rectal bleeding. History of constipation on Linzess 72 mcg and intermittent rectal bleeding.  She underwent colonoscopy on 08/23/2018 for rectal bleeding which revealed three 2-4 mm polyps in the rectum and sigmoid colon, external and internal hemorrhoids, tortuous left colon.  Pathology with 3 hyperplastic polyps.  Repeat colonoscopy in 10 years.  Advised to see surgery for hemorrhoids if supportive measures are ineffective.   Last seen in our office on 01/13/19 for follow-up of rectal pain and rectal bleeding. She had significant improvement in rectal bleeding after starting Anusol cream; however, rectal pain was continuing.  Pain was sharp, worse after BMs but continued thereafter.  Anusol helps some but not significantly.  Rectal exam with nonthrombosed external and internal hemorrhoids.  No obvious anal fissure but she did have pain during internal exam.  No abscesses or masses appreciated.  Plans to empirically treat for anal fissure as Anusol cream was not providing any significant relief to rectal pain.  Rectiv twice daily prescribed.  Lidocaine cream for symptomatic relief. Advised to stop Anusol cream and use only as needed for rectal bleeding, add daily fiber supplement, sitz baths, limit toilet time, and call with progress report in 2 weeks. Would need to consider surgery referral if no significant improvement with Rectiv.   No progress report received.   Today: Every morning she is having nose bleeds for the last 4 days. Has called her PCP but he hasn't called her back. Denies sinus trouble. No nasal congestion. No fever or chills. Intermittent daily chest pain x4 days.  Started at the same  time as nosebleeds.  Occurs when she is walking a lot. Will resolve after sitting down for about 20 minutes. Both arms will get numb. No pain right now. Associated shortness of breath and lightheadedness. Doesn't know family history. No pain at rest. Intermittent fluttering of her heart has been present for a while.   Rectal pain and rectal bleeding has resolved. Used Rectiv for about 4 weeks and pain resolved. Not using Anusol. Last episode of bleeding has been "a while." BMs daily. No constipation or diarrhea. Linzess is working well. No black stools. No abdominal pain. Will have heartburn with spicy foods/tomato based products. Otherwise, no regular heartburn. Tries to limit salt. No dysphagia. No nausea or vomiting.    Past Medical History:  Diagnosis Date  . Anxiety    Panic attacks- "cries"  . Arthritis   . Complication of anesthesia    had some hallucations  . Fibroids, submucosal 12/02/2016  . GERD (gastroesophageal reflux disease)   . H/O colonoscopy   . Hypertension   . Rectal bleeding   . Shortness of breath dyspnea    with exertion  . Thickened endometrium 12/02/2016    Past Surgical History:  Procedure Laterality Date  . BREAST LUMPECTOMY Right   . CESAREAN SECTION     x3  . COLONOSCOPY    . COLONOSCOPY WITH PROPOFOL N/A 08/23/2018   PROPOFOL;  Surgeon: Danie Binder, MD; three 2-4 mm polyps in the rectum and sigmoid colon, external and internal hemorrhoids, tortuous left colon.  Pathology with 3 hyperplastic polyps.  Next colonoscopy in 2030.  Marland Kitchen DENTAL  SURGERY    . MASS EXCISION N/A 12/06/2017   Procedure: EXCISION 1CM CYST ON FACE;  Surgeon: Virl Cagey, MD;  Location: [facility];  Service: General;  Laterality: N/A;  . MULTIPLE EXTRACTIONS WITH ALVEOLOPLASTY Bilateral 04/12/2015   Procedure: BILATERAL MULTIPLE EXTRACTIONS NUMBERS TWENTY, TWENTY ONE, TWENTY TWO, TWENTY THREE, TWENTY FOUR, TWENTY FIVE, TWENTY EIGHT, TWENTY NINE WITH ALVEOLOPLASTY;  Surgeon: Diona Browner, DDS;  Location: [facility];  Service: Oral Surgery;  Laterality: Bilateral;  . POLYPECTOMY  08/23/2018   Procedure: POLYPECTOMY;  Surgeon: Danie Binder, MD;  [facility];  Service: Endoscopy;;  . TUBAL LIGATION      Current Outpatient Medications  Medication Sig Dispense Refill  . labetalol (NORMODYNE) 200 MG tablet Take 200 mg by mouth 2 (two) times daily.    Marland Kitchen linaclotide (LINZESS) 72 MCG capsule Take 1 capsule (72 mcg total) by mouth daily before breakfast. 90 capsule 3  . LORazepam (ATIVAN) 1 MG tablet Take 1 mg by mouth at bedtime.   0  . Multiple Vitamin (MULTIVITAMIN PO) Take 1 tablet by mouth daily.    . naproxen (NAPROSYN) 500 MG tablet Take 500 mg by mouth 2 (two) times a day.     . clotrimazole-betamethasone (LOTRISONE) cream Apply 1 application topically 2 (two) times daily. (Patient not taking: Reported on 04/19/2019) 30 g 1  . metroNIDAZOLE (FLAGYL) 500 MG tablet Take 1 tablet (500 mg total) by mouth 2 (two) times daily. (Patient not taking: Reported on 04/19/2019) 14 tablet 0  . Nitroglycerin (RECTIV) 0.4 % OINT Place 1 inch rectally 2 (two) times daily. (Patient not taking: Reported on 04/19/2019) 30 g 1   No current facility-administered medications for this visit.    Allergies as of 04/19/2019 - Review Complete 04/19/2019  Allergen Reaction Noted  . Bee venom Anaphylaxis 11/04/2010  . Tramadol Hives 11/04/2010  . Asa [aspirin] Other (See Comments) 08/14/2015  . Latex Itching 12/26/2018  . Peppermint flavor Other (See Comments) 03/01/2018    Family History  Adopted: Yes  Problem Relation Age of Onset  . Heart disease Other   . Colon cancer Neg Hx     Social History   Socioeconomic History  . Marital status: Significant Other    Spouse name: Not on file  . Number of children: Not on file  . Years of education: Not on file  . Highest education level: Not on file  Occupational History  . Not on file  Tobacco Use  . Smoking status: Current  Every Day Smoker    Packs/day: 1.00    Years: 31.00    Pack years: 31.00    Types: Cigarettes  . Smokeless tobacco: Never Used  Substance and Sexual Activity  . Alcohol use: Yes    Comment: occ  . Drug use: No  . Sexual activity: Yes    Birth control/protection: Surgical    Comment: tubal  Other Topics Concern  . Not on file  Social History Narrative  . Not on file   Social Determinants of Health   Financial Resource Strain:   . Difficulty of Paying Living Expenses:   Food Insecurity:   . Worried About Charity fundraiser in the Last Year:   . Arboriculturist in the Last Year:   Transportation Needs:   . Film/video editor (Medical):   Marland Kitchen Lack of Transportation (Non-Medical):   Physical Activity:   . Days of Exercise per Week:   . Minutes of Exercise  per Session:   Stress:   . Feeling of Stress :   Social Connections:   . Frequency of Communication with Friends and Family:   . Frequency of Social Gatherings with Friends and Family:   . Attends Religious Services:   . Active Member of Clubs or Organizations:   . Attends Archivist Meetings:   Marland Kitchen Marital Status:     Review of Systems: Gen: Denies fever, chills, cold or flulike symptoms, or unintentional weight loss. CV: See HPI Resp: Some shortness of breath when laying flat. No cough.  GI: See HPI Derm: Denies rash Heme: See HPI  Physical Exam: BP (!) 144/85   Pulse 77   Temp (!) 97.1 F (36.2 C) (Temporal)   Ht 5\' 4"  (1.626 m)   Wt 265 lb 9.6 oz (120.5 kg)   LMP 11/11/2016   BMI 45.59 kg/m  General:   Alert and oriented. No distress noted. Pleasant and cooperative. Having a nose bleed when I walked in the room. Resolved after a couple minutes of pressure.  Head:  Normocephalic and atraumatic. Eyes:  Conjuctiva clear without scleral icterus. Heart:  S1, S2 present without murmurs appreciated. Lungs:  Clear to auscultation bilaterally. No wheezes, rales, or rhonchi. No distress.  Abdomen:   +BS, soft, non-tender and non-distended. No rebound or guarding. No HSM or masses noted. Msk:  Symmetrical without gross deformities. Normal posture. Extremities:  Without edema. Neurologic:  Alert and  oriented x4 Psych: Normal mood and affect.

## 2019-04-19 ENCOUNTER — Encounter: Payer: Self-pay | Admitting: Gastroenterology

## 2019-04-19 ENCOUNTER — Other Ambulatory Visit: Payer: Self-pay

## 2019-04-19 ENCOUNTER — Ambulatory Visit (INDEPENDENT_AMBULATORY_CARE_PROVIDER_SITE_OTHER): Payer: Medicare HMO | Admitting: Gastroenterology

## 2019-04-19 VITALS — BP 144/85 | HR 77 | Temp 97.1°F | Ht 64.0 in | Wt 265.6 lb

## 2019-04-19 DIAGNOSIS — R079 Chest pain, unspecified: Secondary | ICD-10-CM

## 2019-04-19 DIAGNOSIS — K625 Hemorrhage of anus and rectum: Secondary | ICD-10-CM | POA: Diagnosis not present

## 2019-04-19 DIAGNOSIS — K6289 Other specified diseases of anus and rectum: Secondary | ICD-10-CM | POA: Diagnosis not present

## 2019-04-19 DIAGNOSIS — K5900 Constipation, unspecified: Secondary | ICD-10-CM

## 2019-04-19 NOTE — Assessment & Plan Note (Signed)
Patient reports daily intermittent chest pain x4 days occurring when she walks "a lot."  Associated shortness of breath and lightheadedness as well as bilateral arm numbness.  Symptoms resolve after sitting for about 20 minutes.  She has not seen her PCP about this but states she is going to go by their office today.  Not currently having chest pain.  She was advised to see her PCP today and if chest pain returns, she should proceed to the emergency room.

## 2019-04-19 NOTE — Assessment & Plan Note (Signed)
Resolved with use of Anusol cream as needed.  Likely secondary to known hemorrhoids.  Colonoscopy on 08/23/2018 for rectal bleeding with 3 hyperplastic polyps, external, and internal hemorrhoids.  She was advised to see surgery for hemorrhoid symptoms if supportive measures are ineffective.  Patient prefers to avoid surgery if possible and continue with rectal creams.  Continue using Anusol cream as needed for rectal bleeding/hemorrhoid symptoms. Follow-up in 6 months.

## 2019-04-19 NOTE — Assessment & Plan Note (Signed)
Rectal pain has resolved s/p 4 weeks of rectal Rectiv for empiric anal fissure treatment. No Rectiv in about 1 month and no return of symptoms. She does have intermittent rectal bleeding with known external/internal hemorrhoids with colonoscopy on 08/23/2018. Rectal bleeding had improved with anusol cream prior to the start of rectiv.   As rectal bleeding had improved with Anusol cream but had ongoing rectal pain that ultimately improved with Rectiv, I suspect she likely had an anal fissure that has now healed. She will continue to monitor for return of symptoms. Continue Linzess 72 mcg daily for constipation as this is working well.  Continue Anusol cream as needed for rectal bleeding/hemorrhoid symptoms.  Follow-up in 6 months.

## 2019-04-19 NOTE — Assessment & Plan Note (Signed)
Well-controlled on Linzess 72 mcg daily.  No alarm symptoms.  Colonoscopy up-to-date in July 2020 with recommendations to repeat in 2030.   Continue Linzess 72 mcg daily. Follow-up in 6 months.

## 2019-04-19 NOTE — Patient Instructions (Signed)
Please go by your PCPs office today as we discussed to get an appointment for your nose bleeds and chest pain.   Should you have return of chest pain, I recommend you proceed to the emergency room.  Continue taking Linzess 72 mcg daily as this is working well for constipation.  Continue using Anusol cream as needed for rectal bleeding/hemorrhoid symptoms.  We will plan to see back in 6 months.  Call if you have questions or concerns prior.  Aliene Altes, PA-C [facility]

## 2019-04-20 NOTE — Progress Notes (Signed)
Cc'ed to pcp °

## 2019-04-24 ENCOUNTER — Telehealth: Payer: Self-pay | Admitting: Obstetrics & Gynecology

## 2019-04-24 NOTE — Telephone Encounter (Signed)

## 2019-04-25 ENCOUNTER — Other Ambulatory Visit (INDEPENDENT_AMBULATORY_CARE_PROVIDER_SITE_OTHER): Payer: Medicare HMO | Admitting: *Deleted

## 2019-04-25 ENCOUNTER — Other Ambulatory Visit: Payer: Self-pay

## 2019-04-25 ENCOUNTER — Other Ambulatory Visit (HOSPITAL_COMMUNITY)
Admission: RE | Admit: 2019-04-25 | Discharge: 2019-04-25 | Disposition: A | Discharge status: Home / Self Care | Payer: Medicare HMO | Source: Ambulatory Visit | Attending: Obstetrics & Gynecology | Admitting: Obstetrics & Gynecology

## 2019-04-25 DIAGNOSIS — A599 Trichomoniasis, unspecified: Secondary | ICD-10-CM | POA: Diagnosis present

## 2019-04-25 NOTE — Progress Notes (Signed)
   NURSE VISIT-POC  SUBJECTIVE:  Autumn [name] is a 54 y.o. [id number] GYN patientfemale here for a vaginal swab for proof of cure after treatment for Trichomonas.  She reports the following symptoms: none for 0 day. Denies abnormal vaginal bleeding, significant pelvic pain, fever, or UTI symptoms.  OBJECTIVE:  LMP 11/11/2016   Appears well, in no apparent distress  ASSESSMENT: Vaginal swab for proof of cure after treatment for trich  PLAN: Self-collected vaginal probe for Trichomonas sent to lab Treatment: to be determined once results are received Follow-up as needed if symptoms persist/worsen, or new symptoms develop  Levy Pupa  04/25/2019 2:40 PM

## 2019-04-25 NOTE — Progress Notes (Signed)
Chart reviewed for nurse visit. Agree with plan of care.  Estill Dooms, NP 04/25/2019 5:08 PM

## 2019-04-27 ENCOUNTER — Telehealth: Payer: Self-pay | Admitting: Adult Health

## 2019-04-27 LAB — CERVICOVAGINAL ANCILLARY ONLY
Comment: NEGATIVE
Trichomonas: NEGATIVE

## 2019-04-27 NOTE — Telephone Encounter (Signed)
Pt notified that vaginal swab negative for trich

## 2019-06-11 ENCOUNTER — Other Ambulatory Visit: Payer: Self-pay | Admitting: Advanced Practice Midwife

## 2019-06-26 ENCOUNTER — Other Ambulatory Visit (HOSPITAL_COMMUNITY): Payer: Self-pay | Admitting: Family Medicine

## 2019-06-26 DIAGNOSIS — Z1231 Encounter for screening mammogram for malignant neoplasm of breast: Secondary | ICD-10-CM

## 2019-06-28 ENCOUNTER — Ambulatory Visit (HOSPITAL_COMMUNITY)
Admission: RE | Admit: 2019-06-28 | Discharge: 2019-06-28 | Disposition: A | Discharge status: Home / Self Care | Payer: Medicare HMO | Source: Ambulatory Visit | Attending: Family Medicine | Admitting: Family Medicine

## 2019-06-28 ENCOUNTER — Other Ambulatory Visit: Payer: Self-pay

## 2019-06-28 DIAGNOSIS — Z1231 Encounter for screening mammogram for malignant neoplasm of breast: Secondary | ICD-10-CM | POA: Diagnosis not present

## 2019-07-20 ENCOUNTER — Other Ambulatory Visit: Payer: Self-pay | Admitting: Advanced Practice Midwife

## 2019-07-27 ENCOUNTER — Ambulatory Visit (INDEPENDENT_AMBULATORY_CARE_PROVIDER_SITE_OTHER): Payer: Medicare HMO | Admitting: General Surgery

## 2019-07-27 ENCOUNTER — Other Ambulatory Visit: Payer: Self-pay

## 2019-07-27 ENCOUNTER — Encounter: Payer: Self-pay | Admitting: General Surgery

## 2019-07-27 VITALS — BP 123/83 | HR 98 | Temp 97.9°F | Resp 16 | Ht 64.0 in | Wt 258.0 lb

## 2019-07-27 DIAGNOSIS — L089 Local infection of the skin and subcutaneous tissue, unspecified: Secondary | ICD-10-CM | POA: Insufficient documentation

## 2019-07-27 DIAGNOSIS — L723 Sebaceous cyst: Secondary | ICD-10-CM | POA: Diagnosis not present

## 2019-07-27 MED ORDER — SULFAMETHOXAZOLE-TRIMETHOPRIM 400-80 MG PO TABS: 1.0000 | ORAL_TABLET | Freq: Two times a day (BID) | ORAL | 0 refills | Status: AC

## 2019-07-27 NOTE — Progress Notes (Signed)
[facility] Note   HPI:  54 y.o. 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 She has a prior history of similar on the right s/p excision with me. She has had recent drainage and swelling.   Review of Systems:  Swelling on left cheek Drainage from cyst  All other review of systems: otherwise negative   Vital Signs:  BP 123/83   Pulse 98   Temp 97.9 F (36.6 C) (Temporal)   Resp 16   Ht 5\' 4"  (1.626 m)   Wt 258 lb (117 kg)   LMP 11/11/2016   SpO2 96%   BMI 44.29 kg/m    Physical Exam:  Physical Exam Vitals reviewed.  HENT:     Head: Normocephalic.     Comments: Tender left cheek with swelling superficially, no drainage currently, 1cm area consistent with likely cyst Cardiovascular:     Rate and Rhythm: Normal rate.  Pulmonary:     Effort: Pulmonary effort is normal.  Neurological:     Mental Status: She is alert.      Assessment:  54 y.o. 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 She wants to avoid excision this time if possible. She came to me first after getting the swelling and drainage.  Plan:  - Trial antibiotics  - May ultimately need excision  Future Appointments  Date Time Provider [place]  08/15/2019 10:15 AM Virl Cagey, MD [facility] None  10/25/2019 10:00 AM Erenest Rasher, PA-C [facility]     All of the above recommendations were discussed with the patient, and all of patient's questions were answered to her expressed satisfaction.  Curlene Labrum, MD [facility] [address] [place], [place] [address] [phone] (office)

## 2019-07-28 ENCOUNTER — Telehealth: Payer: Self-pay | Admitting: Gastroenterology

## 2019-07-28 NOTE — Telephone Encounter (Addendum)
Pt called back and said that stomach is swelling.  Has been vomiting and having nausea.  Pt has not took pregnancy test. Pt says she does not have periods anymore.  Was given medication (Sulfamethoxazole) to help with boil on face.  Started it yesterday.  Said she had recent TCS done with [place] last year.  Wants to know if we can advise.  Routing to [place], [place] and [place].  [phone]

## 2019-07-28 NOTE — Telephone Encounter (Signed)
Called pt spoke to her husband. He verified name and dob stated pt could be pregnant and is having signs. Went to pcp and had lab work. Husband stated pt will call back when she is able to talk

## 2019-07-28 NOTE — Telephone Encounter (Signed)
[phone] please call patient, she is having some swelling in her abdomen and called her pcp and she did fully understand what they told her.

## 2019-07-28 NOTE — Telephone Encounter (Signed)
Spoke with patient. Nausea/vomiting x2 3 days ago and again x2 yesterday. Mild nausea without vomiting today. No hematemesis. No fever or chills. Started on Bactrim by Dr. Constance Haw yesterday for boil on face. Reports mild upper and lower abdominal pain, swelling in legs and abdomen x 3 months. No CP or SOB. No menstrual cycle in 5 years.   States she stopped by her PCPs office yesterday and they were talking about "hormones and other stuff I didn't understand." Had regular appt with PCP 1 month ago but PCP didn't notice the swelling. She had labs completed at that time but doesn't know the results. Also reports being on a ?sodium tablet daily and receiving medications in the mail from [facility] that were different than prior medications. She doesn't know what they are, but she started taking them. She is not home at this time to look at them either.  As patient was not having any of these symptoms at her last visit, she will need OV for further evaluation. Advised she call her PCP today to arrange follow-up ASAP to discuss symptoms and her medications. Advised to back down to a bland diet for the next few days and ensure she is staying hydrated to see if N/V resolves on its own. Doubt pregnancy, but she will take an overt the counter pregnancy test. We will schedule follow-up but we are booked out to August.   Stacey: Please arrange follow-up for next available. N/V/abdominal swelling.

## 2019-08-01 NOTE — Telephone Encounter (Signed)
Noted  

## 2019-08-01 NOTE — Telephone Encounter (Signed)
Called patient with appointment for Thursday and she could not do that, added her to cancellation list

## 2019-08-09 ENCOUNTER — Emergency Department (HOSPITAL_COMMUNITY)
Admission: EM | Admit: 2019-08-09 | Discharge: 2019-08-09 | Disposition: A | Discharge status: Home / Self Care | Payer: Medicare HMO | Attending: Emergency Medicine | Admitting: Emergency Medicine

## 2019-08-09 ENCOUNTER — Other Ambulatory Visit: Payer: Self-pay

## 2019-08-09 ENCOUNTER — Telehealth: Payer: Self-pay | Admitting: Emergency Medicine

## 2019-08-09 ENCOUNTER — Encounter (HOSPITAL_COMMUNITY): Payer: Self-pay | Admitting: Emergency Medicine

## 2019-08-09 DIAGNOSIS — K625 Hemorrhage of anus and rectum: Secondary | ICD-10-CM

## 2019-08-09 DIAGNOSIS — K644 Residual hemorrhoidal skin tags: Secondary | ICD-10-CM | POA: Diagnosis not present

## 2019-08-09 DIAGNOSIS — R109 Unspecified abdominal pain: Secondary | ICD-10-CM | POA: Diagnosis present

## 2019-08-09 DIAGNOSIS — F17210 Nicotine dependence, cigarettes, uncomplicated: Secondary | ICD-10-CM | POA: Diagnosis not present

## 2019-08-09 DIAGNOSIS — E669 Obesity, unspecified: Secondary | ICD-10-CM | POA: Insufficient documentation

## 2019-08-09 DIAGNOSIS — K649 Unspecified hemorrhoids: Secondary | ICD-10-CM

## 2019-08-09 DIAGNOSIS — Z79899 Other long term (current) drug therapy: Secondary | ICD-10-CM | POA: Insufficient documentation

## 2019-08-09 LAB — COMPREHENSIVE METABOLIC PANEL
ALT: 30 U/L (ref 0–44)
AST: 32 U/L (ref 15–41)
Albumin: 4.3 g/dL (ref 3.5–5.0)
Alkaline Phosphatase: 88 U/L (ref 38–126)
Anion gap: 8 (ref 5–15)
BUN: 18 mg/dL (ref 6–20)
CO2: 25 mmol/L (ref 22–32)
Calcium: 9.5 mg/dL (ref 8.9–10.3)
Chloride: 104 mmol/L (ref 98–111)
Creatinine, Ser: 1.06 mg/dL — ABNORMAL HIGH (ref 0.44–1.00)
GFR calc Af Amer: >60 mL/min (ref >60)
GFR calc non Af Amer: >60 mL/min (ref >60)
Glucose, Bld: 95 mg/dL (ref 70–99)
Potassium: 4.1 mmol/L (ref 3.5–5.1)
Sodium: 137 mmol/L (ref 135–145)
Total Bilirubin: 0.3 mg/dL (ref 0.3–1.2)
Total Protein: 8 g/dL (ref 6.5–8.1)

## 2019-08-09 LAB — CBC WITH DIFFERENTIAL/PLATELET
Abs Immature Granulocytes: 0.01 10*3/uL (ref 0.00–0.07)
Basophils Absolute: 0.1 10*3/uL (ref 0.0–0.1)
Basophils Relative: 1 %
Eosinophils Absolute: 0.3 10*3/uL (ref 0.0–0.5)
Eosinophils Relative: 4 %
HCT: 39.9 % (ref 36.0–46.0)
Hemoglobin: 12.5 g/dL (ref 12.0–15.0)
Immature Granulocytes: 0 %
Lymphocytes Relative: 33 %
Lymphs Abs: 3.1 10*3/uL (ref 0.7–4.0)
MCH: 23.3 pg — ABNORMAL LOW (ref 26.0–34.0)
MCHC: 31.3 g/dL (ref 30.0–36.0)
MCV: 74.4 fL — ABNORMAL LOW (ref 80.0–100.0)
Monocytes Absolute: 0.9 10*3/uL (ref 0.1–1.0)
Monocytes Relative: 9 %
Neutro Abs: 5 10*3/uL (ref 1.7–7.7)
Neutrophils Relative %: 53 %
Platelets: 396 10*3/uL (ref 150–400)
RBC: 5.36 MIL/uL — ABNORMAL HIGH (ref 3.87–5.11)
RDW: 14.2 % (ref 11.5–15.5)
WBC: 9.4 10*3/uL (ref 4.0–10.5)
nRBC: 0 % (ref 0.0–0.2)

## 2019-08-09 LAB — TYPE AND SCREEN
ABO/RH(D): O POS
Antibody Screen: NEGATIVE

## 2019-08-09 LAB — URINALYSIS, ROUTINE W REFLEX MICROSCOPIC
Bacteria, UA: NONE SEEN
Bilirubin Urine: NEGATIVE
Glucose, UA: NEGATIVE mg/dL
Ketones, ur: NEGATIVE mg/dL
Leukocytes,Ua: NEGATIVE
Nitrite: NEGATIVE
Protein, ur: NEGATIVE mg/dL
Specific Gravity, Urine: 1.02 (ref 1.005–1.030)
pH: 7 (ref 5.0–8.0)

## 2019-08-09 LAB — POC OCCULT BLOOD, ED: Fecal Occult Bld: POSITIVE — AB

## 2019-08-09 LAB — PROTIME-INR
INR: 0.9 (ref 0.8–1.2)
Prothrombin Time: 12.1 seconds (ref 11.4–15.2)

## 2019-08-09 LAB — ABO/RH: ABO/RH(D): O POS

## 2019-08-09 MED ORDER — DOCUSATE SODIUM 100 MG PO CAPS
100.0000 mg | ORAL_CAPSULE | Freq: Two times a day (BID) | ORAL | 0 refills | Status: DC
Start: 2019-08-09 — End: 2020-04-11

## 2019-08-09 MED ORDER — POLYETHYLENE GLYCOL 3350 17 G PO PACK
17.0000 g | PACK | Freq: Two times a day (BID) | ORAL | 0 refills | Status: DC
Start: 2019-08-09 — End: 2020-04-11

## 2019-08-09 NOTE — ED Provider Notes (Signed)
[facility] Provider Note   CSN: [id number] Arrival date & time: 08/09/19  1603     History Chief Complaint  Patient presents with  . Hematochezia    Autumn [name] is a 54 y.o. female.  HPI 54 year old female presents with rectal bleeding.  Started 3 days ago.  States she is having a continuous drip from her rectum of blood.  Has had this before and previously had a colonoscopy that showed polyps that were removed as well as internal and external hemorrhoids.  She is not currently on any hemorrhoid treatment.  She does have some rectal discomfort.  Also a little bit of abdominal discomfort.  She chronically has constipation.  She is on Linzess.  Was told by her surgeon to come here to rule out significant blood loss.   Past Medical History:  Diagnosis Date  . Anxiety    Panic attacks- "cries"  . Arthritis   . Complication of anesthesia    had some hallucations  . Fibroids, submucosal 12/02/2016  . GERD (gastroesophageal reflux disease)   . H/O colonoscopy   . Hypertension   . Rectal bleeding   . Shortness of breath dyspnea    with exertion  . Thickened endometrium 12/02/2016    Patient Active Problem List   Diagnosis Date Noted  . Infected sebaceous cyst of skin 07/27/2019  . Chest pain 04/19/2019  . Trichomonal vaginitis 03/20/2019  . Bacterial vaginosis 03/20/2019  . Morbid obesity with BMI of 40.0-44.9, adult ([place]) 03/15/2019  . Rectal pain 01/13/2019  . Constipation 03/01/2018  . Rectal bleeding 03/01/2018  . Abdominal pain 03/01/2018  . Sebaceous cyst 11/30/2017  . Fibroids, submucosal 12/02/2016  . Thickened endometrium 12/02/2016  . Stiffness of joint, not elsewhere classified, ankle and foot 06/27/2012  . Pain in joint, ankle and foot 06/27/2012  . Difficulty walking 06/27/2012  . Ankle sprain 06/15/2012  . ANKLE SPRAIN, RIGHT 09/16/2009  . ANEMIA 03/07/2008  . CIGARETTE SMOKER 03/07/2008  . ABDOMINAL BLOATING 03/07/2008  .  CONSTIPATION, CHRONIC, HX OF 03/07/2008    Past Surgical History:  Procedure Laterality Date  . BREAST LUMPECTOMY Right   . CESAREAN SECTION     x3  . COLONOSCOPY    . COLONOSCOPY WITH PROPOFOL N/A 08/23/2018   PROPOFOL;  Surgeon: Danie Binder, MD; three 2-4 mm polyps in the rectum and sigmoid colon, external and internal hemorrhoids, tortuous left colon.  Pathology with 3 hyperplastic polyps.  Next colonoscopy in 2030.  Marland Kitchen DENTAL SURGERY    . MASS EXCISION N/A 12/06/2017   Procedure: EXCISION 1CM CYST ON FACE;  Surgeon: Virl Cagey, MD;  Location: [facility];  Service: General;  Laterality: N/A;  . MULTIPLE EXTRACTIONS WITH ALVEOLOPLASTY Bilateral 04/12/2015   Procedure: BILATERAL MULTIPLE EXTRACTIONS NUMBERS TWENTY, TWENTY ONE, TWENTY TWO, TWENTY THREE, TWENTY FOUR, TWENTY FIVE, TWENTY EIGHT, TWENTY NINE WITH ALVEOLOPLASTY;  Surgeon: Diona Browner, DDS;  Location: [facility];  Service: Oral Surgery;  Laterality: Bilateral;  . POLYPECTOMY  08/23/2018   Procedure: POLYPECTOMY;  Surgeon: Danie Binder, MD;  Location: [facility];  Service: Endoscopy;;  . TUBAL LIGATION       OB History    Gravida  3   Para  3   Term  3   Preterm      AB      Living  3     SAB      TAB      Ectopic  Multiple      Live Births           Obstetric Comments  Unsure of middle childs dob, child was adopted.         Family History  Adopted: Yes  Problem Relation Age of Onset  . Heart disease Other   . Colon cancer Neg Hx     Social History   Tobacco Use  . Smoking status: Current Every Day Smoker    Packs/day: 1.00    Years: 31.00    Pack years: 31.00    Types: Cigarettes  . Smokeless tobacco: Never Used  Vaping Use  . Vaping Use: Never used  Substance Use Topics  . Alcohol use: Yes    Comment: occ  . Drug use: No    Home Medications Prior to Admission medications   Medication Sig Start Date End Date Taking? Authorizing Provider  docusate sodium (COLACE) 100  MG capsule Take 1 capsule (100 mg total) by mouth every 12 (twelve) hours. 08/09/19   Sherwood Gambler, MD  hydrocortisone (ANUSOL-HC) 2.5 % rectal cream Place 1 application rectally 2 (two) times daily.    [provider]  labetalol (NORMODYNE) 200 MG tablet Take 200 mg by mouth 2 (two) times daily.    [provider]  linaclotide Rolan Lipa) 72 MCG capsule Take 1 capsule (72 mcg total) by mouth daily before breakfast. 02/22/19   Carlis Stable, NP  LORazepam (ATIVAN) 1 MG tablet Take 1 mg by mouth at bedtime.  11/05/17   [provider]  Multiple Vitamin (MULTIVITAMIN PO) Take 1 tablet by mouth daily.    [provider]  naproxen (NAPROSYN) 500 MG tablet Take 500 mg by mouth 2 (two) times a day.     [provider]  polyethylene glycol (MIRALAX / GLYCOLAX) 17 g packet Take 17 g by mouth 2 (two) times daily. 08/09/19   Sherwood Gambler, MD    Allergies    Bee venom, Tramadol, Asa [aspirin], Latex, and Peppermint flavor  Review of Systems   Review of Systems  Gastrointestinal: Positive for abdominal pain, blood in stool and rectal pain.  All other systems reviewed and are negative.   Physical Exam Updated Vital Signs BP (!) 144/96   Pulse 90   Temp 98.4 F (36.9 C) (Oral)   Resp 16   Ht 5\' 4"  (1.626 m)   Wt 117 kg   LMP 11/11/2016   SpO2 100%   BMI 44.29 kg/m   Physical Exam Vitals and nursing note reviewed. Exam conducted with a chaperone present.  Constitutional:      Appearance: She is well-developed. She is obese.  HENT:     Head: Normocephalic and atraumatic.     Right Ear: External ear normal.     Left Ear: External ear normal.     Nose: Nose normal.  Eyes:     General:        Right eye: No discharge.        Left eye: No discharge.  Cardiovascular:     Rate and Rhythm: Normal rate and regular rhythm.     Heart sounds: Normal heart sounds.  Pulmonary:     Effort: Pulmonary effort is normal.     Breath sounds: Normal breath  sounds.  Abdominal:     General: There is no distension.     Palpations: Abdomen is soft.     Tenderness: There is no abdominal tenderness.  Genitourinary:    Rectum: External hemorrhoid present.  No mass.     Comments: Scant amount of blood on DRE. Has some mild hemorrhoids, no thrombosed hemorrhois Skin:    General: Skin is warm and dry.  Neurological:     Mental Status: She is alert.  Psychiatric:        Mood and Affect: Mood is not anxious.     ED Results / Procedures / Treatments   Labs (all labs ordered are listed, but only abnormal results are displayed) Labs Reviewed  COMPREHENSIVE METABOLIC PANEL - Abnormal; Notable for the following components:      Result Value   Creatinine, Ser 1.06 (*)    All other components within normal limits  CBC WITH DIFFERENTIAL/PLATELET - Abnormal; Notable for the following components:   RBC 5.36 (*)    MCV 74.4 (*)    MCH 23.3 (*)    All other components within normal limits  URINALYSIS, ROUTINE W REFLEX MICROSCOPIC - Abnormal; Notable for the following components:   Hgb urine dipstick LARGE (*)    All other components within normal limits  POC OCCULT BLOOD, ED - Abnormal; Notable for the following components:   Fecal Occult Bld POSITIVE (*)    All other components within normal limits  PROTIME-INR  TYPE AND SCREEN  ABO/RH    EKG None  Radiology No results found.  Procedures Procedures (including critical care time)  Medications Ordered in ED Medications - No data to display  ED Course  I have reviewed the triage vital signs and the nursing notes.  Pertinent labs & imaging results that were available during my care of the patient were reviewed by me and considered in my medical decision making (see chart for details).    MDM Rules/Calculators/A&P                          Patient is well-appearing here.  Vital signs are stable.  Labs have been reviewed and are overall unremarkable including normal hemoglobin.  She does  not appear to have serious bleeding at this time.  She notes some abdominal discomfort that is vague and is not reproducible on exam.  No vomiting.  I doubt serious intra-abdominal emergency and do not think CT is needed.  She has Anusol and lidocaine at home but has not been using them and was encouraged to do so given this is likely hemorrhoidal.  Advised her to follow-up with her surgeon.  She has significant constipation despite being on Linzess and will recommend Colace and MiraLAX as well as increased water and fiber intake. Final Clinical Impression(s) / ED Diagnoses Final diagnoses:  Rectal bleeding  Hemorrhoids, unspecified hemorrhoid type    Rx / DC Orders ED Discharge Orders         Ordered    polyethylene glycol (MIRALAX / GLYCOLAX) 17 g packet  2 times daily     Discontinue  Reprint     08/09/19 2227    docusate sodium (COLACE) 100 MG capsule  Every 12 hours     Discontinue  Reprint     08/09/19 2227           Sherwood Gambler, MD 08/09/19 2230

## 2019-08-09 NOTE — Telephone Encounter (Signed)
Called pt back and notified her of providers recommendations Pt stated she will go the the er

## 2019-08-09 NOTE — Telephone Encounter (Signed)
She has history of hemorrhoids that has caused rectal bleeding in the past, but this would not cause the abdominal pain. Considering persistent rectal bleeding, abdominal pain, lightheadedness, and dizziness, I recommend she proceed to the emergency room for evaluation.

## 2019-08-09 NOTE — ED Triage Notes (Signed)
Pt c/o rectal bleeding that she describes as a steady drip since 08/06/2019.

## 2019-08-09 NOTE — Telephone Encounter (Signed)
Pt called verified name and dob had colonoscopy done last year and began to have bleeding in  rectum on 7/11 and it is constantly "dripping" patient stated the blood is dark red in color and there is pain in her abd around her belly button. Pt states she is very light headed, dizzy but denies vomiting.

## 2019-08-15 ENCOUNTER — Encounter: Payer: Self-pay | Admitting: General Surgery

## 2019-08-15 ENCOUNTER — Other Ambulatory Visit: Payer: Self-pay

## 2019-08-15 ENCOUNTER — Ambulatory Visit (INDEPENDENT_AMBULATORY_CARE_PROVIDER_SITE_OTHER): Payer: Medicare HMO | Admitting: General Surgery

## 2019-08-15 ENCOUNTER — Telehealth: Payer: Self-pay | Admitting: Emergency Medicine

## 2019-08-15 VITALS — BP 156/85 | HR 88 | Temp 98.0°F | Resp 18 | Ht 64.0 in | Wt 261.0 lb

## 2019-08-15 DIAGNOSIS — L089 Local infection of the skin and subcutaneous tissue, unspecified: Secondary | ICD-10-CM

## 2019-08-15 DIAGNOSIS — L723 Sebaceous cyst: Secondary | ICD-10-CM

## 2019-08-15 NOTE — Telephone Encounter (Signed)
Pt came in the office today and stated she was seen in the er on 7/14 for rectal bleeding. Pt also showed me  A picture of her commode where she had a bm and there was a lot of bright red blood with stool. Pt stated the blood has been dripping out of her bottom since she went to the er and states " there is a lot of pain back there" pt denies any diarrhea, vomiting or abd pain. Pt made an ov for 9/29 @10  am and was placed on the cancellation list if we have any cancellations for anything sooner

## 2019-08-16 ENCOUNTER — Telehealth: Payer: Self-pay | Admitting: Internal Medicine

## 2019-08-16 ENCOUNTER — Encounter: Payer: Self-pay | Admitting: General Surgery

## 2019-08-16 NOTE — Progress Notes (Signed)
[facility] Note   HPI:  54 y.o. Female presents to clinic for follow-up evaluation of regarding her left cheek cyst that was infected. She has completed antibiotics.  Review of Systems:  No drainage No redness No swelling  All other review of systems: otherwise negative   Vital Signs:  BP (!) 156/85   Pulse 88   Temp 98 F (36.7 C) (Oral)   Resp 18   Ht 5\' 4"  (1.626 m)   Wt 261 lb (118.4 kg)   LMP 11/11/2016   SpO2 98%   BMI 44.80 kg/m    Physical Exam:  Physical Exam Vitals reviewed.  HENT:     Head: Normocephalic.     Comments: Left cheek cyst without erythema or drainage Neurological:     Mental Status: She is alert.      Assessment:  54 y.o. yo Female with a recent infection of a dermoid/ sebaceous cyst on her left cheek. This has improved with antibiotics. She wants to hold on any excision at this time.  Plan:  PRN follow up If continues to get infected, may have excise. She is weary of needles and would need sedation    Curlene Labrum, MD [facility] [address] [place], [place] [address] [phone] (office)

## 2019-08-16 NOTE — Telephone Encounter (Signed)
PATIENT CALLED AGAIN INQUIRING IF ANYTHING HAD BEEN DECIDED ON HER CARE

## 2019-08-18 NOTE — Telephone Encounter (Signed)
Called lmom

## 2019-08-18 NOTE — Telephone Encounter (Signed)
Reviewed ER visit. Her hemoglobin was normal at 12.5. Rectal exam with hemorrhoids and small amount of bright red blood. Overall, suspect hemorrhoidal bleed. She has had history of hemorrhoidal bleed as well as possible anal fissure. Per ED note, patient was not using Anusol cream and advised her to do so. Also states patient was constipated despite Linzess 72 mcg and recommended MiraLAX twice a day.  I suspect rectal bleeding is secondary to hemorrhoids. Colonoscopy was performed last year with known internal and external hemorrhoids. She needs to use Anusol cream twice daily per rectum, limit toilet time to 2-3 minutes, and avoid straining.  Please verify the following: Is patient having constipation? If she taking Linzess 72 mcg daily?

## 2019-08-18 NOTE — Telephone Encounter (Signed)
She can continue Linzess 72 mcg with MiraLAX 17 g once or twice daily. If she ends up with diarrhea, she can decrease the frequency of MiraLAX.   Her rectal bleeding should improve with the anusol cream. She needs to use this twice daily for the next 7-14 days. Its important we get her constipation under better control as this will cause her hemorrhoids to flare up and bleed. If she continues to struggle with constipation over the weekend with Linzess and MiraLAX, she should call on Monday.

## 2019-08-18 NOTE — Telephone Encounter (Signed)
Called notified pt of providers recommendations. Pt voiced understanding

## 2019-08-18 NOTE — Telephone Encounter (Signed)
Called pt she stated she has been using the anusol cream twice a day, limiting her toilet time 2-3 mins, and she is avoiding straining. Pt states she is having constipations and her last bm was 2 days ago. Pt sated she discontinued the linzess two days ago because when she went to the er they placed her on mira lax and she started that today

## 2019-08-18 NOTE — Telephone Encounter (Signed)
Pt returned call. Notified her that the provider is currently with pts and that we closed at 1 today. Will call her as soon as the provider gives recommendations

## 2019-08-21 ENCOUNTER — Telehealth: Payer: Self-pay | Admitting: Internal Medicine

## 2019-08-21 NOTE — Telephone Encounter (Signed)
Glad to hear. Is her constipation well managed with Linzess and MiraLAX?

## 2019-08-21 NOTE — Telephone Encounter (Signed)
PATIENT CALLED AND WANTED [place] TO KNOW THAT SHE WAS BETTER AND DID NOT SEE ANY BLOOD TODAY WHEN SHE WENT TO THE BATHROOM

## 2019-08-22 NOTE — Telephone Encounter (Signed)
Pt.notified

## 2019-08-22 NOTE — Telephone Encounter (Signed)
Pt stated she is only taking linzess 46mcg no mirLAX Pt states she is still using rectal cream twice a day but there is a little pressure when she sits down to use the bathroom. On a scale of 0-10 she stated the pressure is a 5. She said it's not bad but it is there

## 2019-08-22 NOTE — Telephone Encounter (Signed)
Noted. As long as Linzess 72 mcg is controlling constipation well, she can continue this without MiraLAX. If she has return of mild constipation she can add MiraLAX 1 capful in 8 oz of water as needed. Of constipation becomes a regular problem, we may need to try increasing Linzess to 145 mcg.   She can continue Anusol cream for total of 10-14 days then use again as needed for rectal bleeding.

## 2019-08-24 ENCOUNTER — Other Ambulatory Visit: Payer: Self-pay | Admitting: Advanced Practice Midwife

## 2019-10-24 NOTE — Progress Notes (Signed)
Referring Provider: Lucia Gaskins, MD Primary Care Physician:  Lucia Gaskins, MD Primary GI Physician: Dr. Abbey Chatters  Chief Complaint  Patient presents with  . Nausea    no longer a problem  . Constipation    BM's daily now, doing well    HPI:   Autumn [name] is a 54 y.o. female with history of constipation, intermittent rectal bleeding in the setting of hemorrhoids.  Colonoscopy up-to-date in July 2020 for rectal bleeding which revealed three2-4 mm polyps in the rectum and sigmoid colon, external and internal hemorrhoids, tortuous left colon. Pathology with 3 hyperplastic polyps.Repeat colonoscopy in 10 years. Advised to see surgery for hemorrhoids if supportive measures are ineffective.  She was empirically treated for an anal fissure starting in December 2020 with Rectiv due to ongoing sharp rectal pain with BMs.  Last seen in our office 04/19/2019.  Rectal pain and rectal bleeding had resolved after 4 weeks of Rectiv.  Linzess was managing constipation well.  No alarm symptoms.  Occasional GERD symptoms in the setting of spicy foods/tomato based products.  No nausea or vomiting.  Her primary concern was recurrent nosebleeds and chest pain x4 days with associated shortness of breath and lightheadedness.  No chest pain at time of office visit.  She was advised to see her PCP today.  Chest pain return, advised to proceed to the emergency room.  Otherwise, she was to continue Linzess 72 mcg daily, Anusol cream as needed for rectal bleeding/hemorrhoid symptoms, and monitor for return of rectal pain.  Telephone call 07/28/2019 with patient reporting nausea/vomiting x2-3 days.  Mild nausea on 7/2 with no vomiting that day.  She had been started on Bactrim by Dr. Constance Haw on 7/1 for a boil on her face.  Admits to upper and lower abdominal pain, swelling in legs and abdomen x3 months, denies chest pain or shortness of breath. Denied fever, chills.  No menstrual cycle in 5 years.  She  was not clear what medication she was taking.  Reported being on a sodium tablet daily.  As patient was not experiencing any symptoms at her last office visit, advised arranging follow-up at the next available time for further evaluation.  Also advise she call PCP to follow-up and discuss medications, back into a bland diet, ensure she is staying hydrated.  Telephone call 08/09/2019 with patient stating she began to have rectal bleeding 7/11 and is constantly "dripping".  Also with pain around the bellybutton along with lightheadedness and dizziness.  She was advised to proceed to the emergency room.  She was seen in the emergency room.  Abdominal exam is benign.  Rectal exam with external hemorrhoid present, scant amount of blood on DRE, hemorrhoids are nonthrombosed.  Fecal occult blood positive.  UA with large amount of hemoglobin.  Hemoglobin within normal limits at 12.5 with microcytic indices which is chronic.  She was advised to use her home Anusol and lidocaine cream.  Apparently, patient also reported constipation despite Linzess so Colace and MiraLAX were recommended.  Patient came to the office 7/20 reporting ongoing rectal bleeding and showed picture to the nurse of commode with a lot of bright red blood with stool.  Denies diarrhea, vomiting, or abdominal pain.  Patient reported ongoing constipation.  She had discontinued Linzess and was only taking MiraLAX as she was unclear on directions provided by the emergency room.  Recommend resuming Linzess with MiraLAX once or twice daily and using Anusol cream twice daily for the next 7-14 days.  Requested call in 1 week if she continued to struggle with symptoms.  She was scheduled for office visit 9/29 and placed on cancellation list.  Patient called 7/26 stating she is no longer seeing bright red blood per rectum.  Advise she complete the course of Anusol cream for total of 10-14 days and continue Linzess with the addition of MiraLAX as needed.  Call  with any ongoing symptoms.  Today:  Constipation: BMs daily. Taking Linzess 72 mcg daily. MiraLAX is not needed. No straining. She is limiting toilet time. No bright red blood per rectum since July.  No abdominal pain. Nausea/vomiting resolved. Feels it was secondary to the antibiotic. GERD symptoms after eating on a daily basis. Taking tums. Triggers include fried foods. Eating more salads, [place], and still having symptoms. No dysphagia. Taking naproxen for back spasms. No melena.   Walking daily for at least 30 minutes. Swelling in lower extremities has resolved.   Past Medical History:  Diagnosis Date  . Anxiety    Panic attacks- "cries"  . Arthritis   . Complication of anesthesia    had some hallucations  . Fibroids, submucosal 12/02/2016  . GERD (gastroesophageal reflux disease)   . H/O colonoscopy   . Hypertension   . Rectal bleeding    in setting of hemorrhoids. Colonoscopy in July 2020  . Shortness of breath dyspnea    with exertion  . Thickened endometrium 12/02/2016    Past Surgical History:  Procedure Laterality Date  . BREAST LUMPECTOMY Right   . CESAREAN SECTION     x3  . COLONOSCOPY    . COLONOSCOPY WITH PROPOFOL N/A 08/23/2018   PROPOFOL;  Surgeon: Danie Binder, MD; three 2-4 mm polyps in the rectum and sigmoid colon, external and internal hemorrhoids, tortuous left colon.  Pathology with 3 hyperplastic polyps.  Next colonoscopy in 2030.  Marland Kitchen DENTAL SURGERY    . MASS EXCISION N/A 12/06/2017   Procedure: EXCISION 1CM CYST ON FACE;  Surgeon: Virl Cagey, MD;  Location: [facility];  Service: General;  Laterality: N/A;  . MULTIPLE EXTRACTIONS WITH ALVEOLOPLASTY Bilateral 04/12/2015   Procedure: BILATERAL MULTIPLE EXTRACTIONS NUMBERS TWENTY, TWENTY ONE, TWENTY TWO, TWENTY THREE, TWENTY FOUR, TWENTY FIVE, TWENTY EIGHT, TWENTY NINE WITH ALVEOLOPLASTY;  Surgeon: Diona Browner, DDS;  Location: [place];  Service: Oral Surgery;  Laterality: Bilateral;  . POLYPECTOMY   08/23/2018   Procedure: POLYPECTOMY;  Surgeon: Danie Binder, MD;  Location: [facility];  Service: Endoscopy;;  . TUBAL LIGATION      Current Outpatient Medications  Medication Sig Dispense Refill  . docusate sodium (COLACE) 100 MG capsule Take 1 capsule (100 mg total) by mouth every 12 (twelve) hours. 60 capsule 0  . hydrocortisone (ANUSOL-HC) 2.5 % rectal cream Place 1 application rectally 2 (two) times daily. As needed    . labetalol (NORMODYNE) 200 MG tablet Take 200 mg by mouth 2 (two) times daily.    Marland Kitchen linaclotide (LINZESS) 72 MCG capsule Take 1 capsule (72 mcg total) by mouth daily before breakfast. 90 capsule 3  . LORazepam (ATIVAN) 1 MG tablet Take 1 mg by mouth at bedtime.   0  . Multiple Vitamin (MULTIVITAMIN PO) Take 1 tablet by mouth daily.    . naproxen (NAPROSYN) 500 MG tablet Take 500 mg by mouth 2 (two) times a day.     Marland Kitchen omeprazole (PRILOSEC) 20 MG capsule Take 1 capsule (20 mg total) by mouth daily before breakfast. 30 capsule 5  . polyethylene  glycol (MIRALAX / GLYCOLAX) 17 g packet Take 17 g by mouth 2 (two) times daily. (Patient not taking: Reported on 10/25/2019) 14 each 0   No current facility-administered medications for this visit.    Allergies as of 10/25/2019 - Review Complete 10/25/2019  Allergen Reaction Noted  . Bee venom Anaphylaxis 11/04/2010  . Tramadol Hives 11/04/2010  . Asa [aspirin] Other (See Comments) 08/14/2015  . Latex Itching 12/26/2018  . Peppermint flavor Other (See Comments) 03/01/2018    Family History  Adopted: Yes  Problem Relation Age of Onset  . Heart disease Other   . Colon cancer Neg Hx     Social History   Socioeconomic History  . Marital status: Significant Other    Spouse name: Not on file  . Number of children: Not on file  . Years of education: Not on file  . Highest education level: Not on file  Occupational History  . Not on file  Tobacco Use  . Smoking status: Current Every Day Smoker    Packs/day: 1.00     Years: 31.00    Pack years: 31.00    Types: Cigarettes  . Smokeless tobacco: Never Used  Vaping Use  . Vaping Use: Never used  Substance and Sexual Activity  . Alcohol use: Yes    Comment: occ  . Drug use: No  . Sexual activity: Yes    Birth control/protection: Surgical    Comment: tubal  Other Topics Concern  . Not on file  Social History Narrative  . Not on file   Social Determinants of Health   Financial Resource Strain:   . Difficulty of Paying Living Expenses: Not on file  Food Insecurity:   . Worried About Charity fundraiser in the Last Year: Not on file  . Ran Out of Food in the Last Year: Not on file  Transportation Needs:   . Lack of Transportation (Medical): Not on file  . Lack of Transportation (Non-Medical): Not on file  Physical Activity:   . Days of Exercise per Week: Not on file  . Minutes of Exercise per Session: Not on file  Stress:   . Feeling of Stress : Not on file  Social Connections:   . Frequency of Communication with Friends and Family: Not on file  . Frequency of Social Gatherings with Friends and Family: Not on file  . Attends Religious Services: Not on file  . Active Member of Clubs or Organizations: Not on file  . Attends Archivist Meetings: Not on file  . Marital Status: Not on file    Review of Systems: Gen: Denies fever, chills, cold or flulike symptoms, lightheadedness, dizziness, presyncope, syncope CV: Denies chest pain for palpitations Resp: Denies dyspnea or cough GI: See HPI Heme: See HPI  Physical Exam: BP 138/87   Pulse 86   Temp (!) 97.1 F (36.2 C) (Oral)   Ht 5\' 4"  (1.626 m)   Wt 265 lb (120.2 kg)   LMP 11/11/2016   BMI 45.49 kg/m  General:   Alert and oriented. No distress noted. Pleasant and cooperative.  Head:  Normocephalic and atraumatic. Eyes:  Conjuctiva clear without scleral icterus. Heart:  S1, S2 present without murmurs appreciated. Lungs:  Clear to auscultation bilaterally. No wheezes,  rales, or rhonchi. No distress.  Abdomen:  +BS, soft, non-tender and non-distended. No rebound or guarding. No HSM or masses noted. Msk:  Symmetrical without gross deformities. Normal posture. Extremities:  Without edema. Neurologic:  Alert and  oriented x4 Psych: Normal mood and affect.

## 2019-10-25 ENCOUNTER — Ambulatory Visit (INDEPENDENT_AMBULATORY_CARE_PROVIDER_SITE_OTHER): Payer: Medicare HMO | Admitting: Gastroenterology

## 2019-10-25 ENCOUNTER — Encounter: Payer: Self-pay | Admitting: Gastroenterology

## 2019-10-25 ENCOUNTER — Other Ambulatory Visit: Payer: Self-pay

## 2019-10-25 VITALS — BP 138/87 | HR 86 | Temp 97.1°F | Ht 64.0 in | Wt 265.0 lb

## 2019-10-25 DIAGNOSIS — K625 Hemorrhage of anus and rectum: Secondary | ICD-10-CM

## 2019-10-25 DIAGNOSIS — K5900 Constipation, unspecified: Secondary | ICD-10-CM

## 2019-10-25 DIAGNOSIS — K219 Gastro-esophageal reflux disease without esophagitis: Secondary | ICD-10-CM | POA: Insufficient documentation

## 2019-10-25 MED ORDER — OMEPRAZOLE 20 MG PO CPDR: 20.0000 mg | DELAYED_RELEASE_CAPSULE | Freq: Every day | ORAL | 5 refills | Status: DC

## 2019-10-25 NOTE — Assessment & Plan Note (Signed)
Well-controlled Linzess 72 mcg daily.  Advise she continue her medications.  May use MiraLAX 1 capful (17g) daily as needed for breakthrough constipation.  Plan to follow-up in 6 months.

## 2019-10-25 NOTE — Patient Instructions (Signed)
To control your acid reflux, start omeprazole 20 mg daily 30 minutes before breakfast.  Please let me know if this does not keep your symptoms well controlled and we can make adjustments.  Follow a GERD diet/lifestyle:  Avoid fried, fatty, greasy, spicy, citrus foods. Avoid caffeine and carbonated beverages. Avoid chocolate. Try eating 4-6 small meals a day rather than 3 large meals. Do not eat within 3 hours of laying down. Prop head of bed up on wood or bricks to create a 6 inch incline.  Continue taking Linzess 72 mcg daily 30 minutes before first meal.  You may use MiraLAX 1 capful (17g) in 8 oz of water daily as needed for breakthrough constipation.   Use Anusol twice daily as needed for 10-14 days at a time for rectal bleeding or hemorrhoid symptoms.  We will plan to see back in 6 months.  Do not hesitate to call with questions or concerns prior.  Aliene Altes, PA-C [facility]

## 2019-10-25 NOTE — Assessment & Plan Note (Signed)
Patient reports GERD symptoms daily taking Tums.  She has never been on any daily medication for this.  No alarm symptoms.  Plan: Start omeprazole 20 mg daily 30 minutes before breakfast. Counseled on GERD diet/lifestyle. Encouraged her to continue walking and eating a healthier diet in efforts to lose weight. Plan follow-up in 6 months.  Advise she call if omeprazole does not keep GERD symptoms well controlled.

## 2019-10-25 NOTE — Assessment & Plan Note (Addendum)
History of intermittent rectal bleeding in the setting of external and internal hemorrhoids.  Last episode was in July 2021. Symptoms resolved with Anusol cream.  Notably, she is also struggling with constipation at that time which is now well controlled with Linzess 72 mcg daily.  Colonoscopy is up-to-date in July 2020 which was also performed for rectal bleeding and revealed 3 hyperplastic polyps, external/internal hemorrhoids, tortuous left colon with recommendations to repeat colonoscopy in 10 years.  We have previously discussed surgical referral for management of hemorrhoids if creams/supportive measures were ineffective.  We discussed this again today.  Patient prefers not to have surgical intervention at this time.  Plan: Use Anusol cream twice daily for 7-10 days as needed for rectal bleeding or hemorrhoid symptoms. Continue Linzess 72 mcg daily for constipation. May use MiraLAX 1 capful (17 g) daily in 8 ounces of water as needed for breakthrough constipation. Discussed the importance of avoiding straining and limiting toilet time for 2-3 minutes. Follow-up in 6 months.

## 2019-11-02 ENCOUNTER — Other Ambulatory Visit: Payer: Self-pay | Admitting: Advanced Practice Midwife

## 2019-12-04 ENCOUNTER — Telehealth: Payer: Self-pay

## 2019-12-04 NOTE — Telephone Encounter (Signed)
Spoke with patient. Rectal bleeding is intermittent with bowel movements. Similar to prior episodes of rectal bleeding secondary to hemorrhoids. Feels well otherwise. Linzess is working well. Advised she resume Anusol rectal cream BID for the next 10-14 days and have CBC completed tomorrow. She will proceed to the ED for any profuse rectal bleeding, lightheadedness, dizziness, or weakness.   Deana, please arrange CBC to be completed on 11/9. Dx: Rectal bleeding.

## 2019-12-04 NOTE — Telephone Encounter (Signed)
I suspect she is likely bleeding from her hemorrhoids as she has previously. Any sharp rectal pain? Any constipation? Is she still taking Linzess? Is she using Anusol currently?   Any lightheadedness, dizziness, or feeling like she will pass out? If so, she will need to proceed to the emergency room. If feeling ok, we can obtain CBC to ensure her hemoglobin has remained stable. Please arrange.   Further recommendations pending answers to the above.

## 2019-12-04 NOTE — Telephone Encounter (Signed)
PT. CALLED TODAY STATING WHEN SHE WAS LAST HERE SHE WAS ADVISED TO CALL IF HER BLEEDING HAD STARTED BACK. PT STATES BLEEDING STARTED 2 DAYS AGO AND ITS BRIGHT RED WITH HER BM. STATES ALSO SHARP LOWER ABDOMEN PAIN WITH IT BUT GOES AWAY AFTER BM. PLEASE ADVISE

## 2019-12-04 NOTE — Telephone Encounter (Signed)
PHONED AND SPOKE WITH THE PATIENT, NO SHARP RECTAL PAIN, NO CONSTIPATION, STILL TAKING LINZESS, AND SHE HAS NOT USED HER ANUSOL CREAM. SHE'S NOT HAVING ANY SYMPTOMS OF LIGHTHEADEDNESS, DIZZINESS, OR PASSING OUT. STATES SHE JUST HAD BLOODWORK DONE BY HER PCP BUT FORGOT WHEN. IF WE NEED HER TO DO BLOODWORK SHES OK WITH THAT.

## 2019-12-05 ENCOUNTER — Telehealth: Payer: Self-pay | Admitting: Internal Medicine

## 2019-12-05 ENCOUNTER — Other Ambulatory Visit: Payer: Self-pay

## 2019-12-05 DIAGNOSIS — K5900 Constipation, unspecified: Secondary | ICD-10-CM

## 2019-12-05 DIAGNOSIS — K625 Hemorrhage of anus and rectum: Secondary | ICD-10-CM

## 2019-12-05 LAB — CBC WITH DIFFERENTIAL/PLATELET
Absolute Monocytes: 757 cells/uL (ref 200–950)
Basophils Absolute: 60 cells/uL (ref 0–200)
Basophils Relative: 0.9 %
Eosinophils Absolute: 335 cells/uL (ref 15–500)
Eosinophils Relative: 5 %
HCT: 38.4 % (ref 35.0–45.0)
Hemoglobin: 11.7 g/dL (ref 11.7–15.5)
Lymphs Abs: 2714 cells/uL (ref 850–3900)
MCH: 23.2 pg — ABNORMAL LOW (ref 27.0–33.0)
MCHC: 30.5 g/dL — ABNORMAL LOW (ref 32.0–36.0)
MCV: 76 fL — ABNORMAL LOW (ref 80.0–100.0)
MPV: 10.8 fL (ref 7.5–12.5)
Monocytes Relative: 11.3 %
Neutro Abs: 2834 cells/uL (ref 1500–7800)
Neutrophils Relative %: 42.3 %
Platelets: 355 10*3/uL (ref 140–400)
RBC: 5.05 10*6/uL (ref 3.80–5.10)
RDW: 13.6 % (ref 11.0–15.0)
Total Lymphocyte: 40.5 %
WBC: 6.7 10*3/uL (ref 3.8–10.8)

## 2019-12-05 NOTE — Telephone Encounter (Signed)
Noted  

## 2019-12-05 NOTE — Telephone Encounter (Signed)
PATIENT CALLED AND SAID SOMEONE FROM HERE CALLED HER

## 2019-12-05 NOTE — Telephone Encounter (Signed)
PHONED PT AND ADVISED LAB WILL BE DRAWN AT [facility] AND SHE IS AGREEABLE TO COME BY THE OFFICE TO GET THE PAPERWORK.

## 2019-12-07 ENCOUNTER — Other Ambulatory Visit: Payer: Self-pay | Admitting: Nurse Practitioner

## 2019-12-07 DIAGNOSIS — K5900 Constipation, unspecified: Secondary | ICD-10-CM

## 2019-12-07 DIAGNOSIS — R1030 Lower abdominal pain, unspecified: Secondary | ICD-10-CM

## 2020-02-26 ENCOUNTER — Telehealth: Payer: Self-pay

## 2020-02-26 ENCOUNTER — Other Ambulatory Visit: Payer: Self-pay | Admitting: Gastroenterology

## 2020-02-26 DIAGNOSIS — K625 Hemorrhage of anus and rectum: Secondary | ICD-10-CM

## 2020-02-26 NOTE — Telephone Encounter (Signed)
The pt. LM on my vm stating she has been bleeding x 4 days. The last 3 days its gotten worse to where there is blood dripping in the commode when she has a BM. She states she has been using the cream and it doesn't work. She is taking her Linzess hasn't worked today. States she feels bloated. Please advise.

## 2020-02-26 NOTE — Telephone Encounter (Signed)
Spoke with patient.  Similar symptoms to prior rectal bleeding.  No significant lightheadedness or dizziness.  Reports Linzess 72 mcg usually works fairly well for her.  Occasional breakthrough constipation.  She did end up having a pretty good bowel movement later this afternoon.   Recommendations: Update CBC. Continue Anusol rectal cream twice daily. Continue Linzess 72 mcg daily. Add Benefiber to teaspoons daily and increase up to 3 times daily as tolerated. Use MiraLAX 17 g in 8 ounces of water for breakthrough constipation. Follow-up with [name] in the near future for follow-up and to discuss candidacy for hemorrhoid banding.  Stacey: Please change patient's follow-up visit. I have discussed case with [name], NP. She needs to see [name] for follow-up and to discuss candidacy for hemorrhoid banding.

## 2020-02-27 NOTE — Telephone Encounter (Signed)
NOTED

## 2020-02-27 NOTE — Telephone Encounter (Signed)
Called patient and changed to a banding appointment with Autumn [name]

## 2020-02-28 LAB — CBC WITH DIFFERENTIAL/PLATELET
Absolute Monocytes: 788 cells/uL (ref 200–950)
Basophils Absolute: 90 cells/uL (ref 0–200)
Basophils Relative: 1.2 %
Eosinophils Absolute: 330 cells/uL (ref 15–500)
Eosinophils Relative: 4.4 %
HCT: 39.9 % (ref 35.0–45.0)
Hemoglobin: 12.4 g/dL (ref 11.7–15.5)
Lymphs Abs: 2850 cells/uL (ref 850–3900)
MCH: 22.9 pg — ABNORMAL LOW (ref 27.0–33.0)
MCHC: 31.1 g/dL — ABNORMAL LOW (ref 32.0–36.0)
MCV: 73.6 fL — ABNORMAL LOW (ref 80.0–100.0)
MPV: 10.1 fL (ref 7.5–12.5)
Monocytes Relative: 10.5 %
Neutro Abs: 3443 cells/uL (ref 1500–7800)
Neutrophils Relative %: 45.9 %
Platelets: 434 10*3/uL — ABNORMAL HIGH (ref 140–400)
RBC: 5.42 10*6/uL — ABNORMAL HIGH (ref 3.80–5.10)
RDW: 13.6 % (ref 11.0–15.0)
Total Lymphocyte: 38 %
WBC: 7.5 10*3/uL (ref 3.8–10.8)

## 2020-02-29 ENCOUNTER — Other Ambulatory Visit: Payer: Self-pay

## 2020-02-29 DIAGNOSIS — K625 Hemorrhage of anus and rectum: Secondary | ICD-10-CM

## 2020-03-02 LAB — IRON,TIBC AND FERRITIN PANEL
%SAT: 28 % (calc) (ref 16–45)
Ferritin: 37 ng/mL (ref 16–232)
Iron: 114 ug/dL (ref 45–160)
TIBC: 410 mcg/dL (calc) (ref 250–450)

## 2020-03-21 ENCOUNTER — Telehealth: Payer: Self-pay

## 2020-03-21 NOTE — Telephone Encounter (Signed)
Pt called @4 :24 pm LM on vm . I returned the pt's call not even 5 minutes later and the phone was sent to her vm. I advised on vm to go to the ER to be treated for "dripping Blood from her rectum". Advised no DR was in the office @this  time. Repeated over vm "go to the ER to be evaluated. (Pt stated on vm that its been like that all day).

## 2020-04-11 ENCOUNTER — Encounter: Payer: Self-pay | Admitting: Gastroenterology

## 2020-04-11 ENCOUNTER — Ambulatory Visit (INDEPENDENT_AMBULATORY_CARE_PROVIDER_SITE_OTHER): Payer: Medicare HMO | Admitting: Gastroenterology

## 2020-04-11 ENCOUNTER — Other Ambulatory Visit: Payer: Self-pay

## 2020-04-11 VITALS — BP 134/88 | HR 78 | Temp 97.1°F | Ht 64.0 in | Wt 276.0 lb

## 2020-04-11 DIAGNOSIS — K648 Other hemorrhoids: Secondary | ICD-10-CM | POA: Diagnosis not present

## 2020-04-11 DIAGNOSIS — K5900 Constipation, unspecified: Secondary | ICD-10-CM

## 2020-04-11 NOTE — Patient Instructions (Signed)
Let's try Linzess at a higher dosage. I have given you 2 different dosages. Let's start with Linzess 145 micrograms (the orange bottle). Take this 30 minutes before breakfast daily. If no improvement after a week, then you may try the Linzess 290 micrograms. Let me know which one you like best!  Try to limit toilet time to 2-3 minutes, avoid straining, and we are working on constipation!  Let me know if interested in a banding!  We will see you in 4-6 months!  It was a pleasure to see you today. I want to create trusting relationships with patients to provide genuine, compassionate, and quality care. I value your feedback. If you receive a survey regarding your visit,  I greatly appreciate you taking time to fill this out.   Annitta Needs, PhD, ANP-BC [facility]

## 2020-04-11 NOTE — Progress Notes (Signed)
Referring Provider: Lucia Gaskins, MD Primary Care Physician:  Lucia Gaskins, MD Primary GI: Dr. Abbey Chatters  Chief Complaint  Patient presents with  . Constipation    Taking linzess but does not feel like she is emptying all the way. Wants to discuss increase dosage    HPI:   Autumn [name] is a 55 y.o. female presenting today with a history of constipation, GERD, rectal bleeding due to internal hemorrhoids. Colonoscopy is up-to-date in July 2020 which was performed for rectal bleeding and revealed 3 hyperplastic polyps, external/internal hemorrhoids, tortuous left colon with recommendations to repeat colonoscopy in 10 years. Here to discuss possible banding.   Sometimes pain at right side of rectum. Not daily. Just every once in awhile. No itching or burning. No prolapsing tissue. Linzess 72 mcg daily. Feels like she could use a little stronger dosage. Not a BM daily. Skips days with BM. Feels bloated. Has had some intermittent rectal bleeding but none in the past week. Declining rectal exam. Does not want a banding.   Past Medical History:  Diagnosis Date  . Anxiety    Panic attacks- "cries"  . Arthritis   . Complication of anesthesia    had some hallucations  . Fibroids, submucosal 12/02/2016  . GERD (gastroesophageal reflux disease)   . H/O colonoscopy   . Hypertension   . Rectal bleeding    in setting of hemorrhoids. Colonoscopy in July 2020  . Shortness of breath dyspnea    with exertion  . Thickened endometrium 12/02/2016    Past Surgical History:  Procedure Laterality Date  . BREAST LUMPECTOMY Right   . CESAREAN SECTION     x3  . COLONOSCOPY    . COLONOSCOPY WITH PROPOFOL N/A 08/23/2018   PROPOFOL;  Surgeon: Danie Binder, MD; three 2-4 mm polyps in the rectum and sigmoid colon, external and internal hemorrhoids, tortuous left colon.  Pathology with 3 hyperplastic polyps.  Next colonoscopy in 2030.  Marland Kitchen DENTAL SURGERY    . MASS EXCISION N/A  12/06/2017   Procedure: EXCISION 1CM CYST ON FACE;  Surgeon: Virl Cagey, MD;  Location: [facility];  Service: General;  Laterality: N/A;  . MULTIPLE EXTRACTIONS WITH ALVEOLOPLASTY Bilateral 04/12/2015   Procedure: BILATERAL MULTIPLE EXTRACTIONS NUMBERS TWENTY, TWENTY ONE, TWENTY TWO, TWENTY THREE, TWENTY FOUR, TWENTY FIVE, TWENTY EIGHT, TWENTY NINE WITH ALVEOLOPLASTY;  Surgeon: Diona Browner, DDS;  Location: [place];  Service: Oral Surgery;  Laterality: Bilateral;  . POLYPECTOMY  08/23/2018   Procedure: POLYPECTOMY;  Surgeon: Danie Binder, MD;  Location: [facility];  Service: Endoscopy;;  . TUBAL LIGATION      Current Outpatient Medications  Medication Sig Dispense Refill  . hydrocortisone (ANUSOL-HC) 2.5 % rectal cream Place 1 application rectally 2 (two) times daily. As needed    . labetalol (NORMODYNE) 200 MG tablet Take 200 mg by mouth 2 (two) times daily.    Marland Kitchen LINZESS 72 MCG capsule TAKE 1 CAPSULE (72 MCG TOTAL) BY MOUTH DAILY BEFORE BREAKFAST. 90 capsule 3  . LORazepam (ATIVAN) 1 MG tablet Take 1 mg by mouth at bedtime.  0  . Multiple Vitamin (MULTIVITAMIN PO) Take 1 tablet by mouth daily.    . naproxen (NAPROSYN) 500 MG tablet Take 500 mg by mouth 2 (two) times a day.    Marland Kitchen omeprazole (PRILOSEC) 20 MG capsule Take 1 capsule (20 mg total) by mouth daily before breakfast. 30 capsule 5  . docusate sodium (COLACE) 100 MG capsule  Take 1 capsule (100 mg total) by mouth every 12 (twelve) hours. (Patient not taking: Reported on 04/11/2020) 60 capsule 0  . polyethylene glycol (MIRALAX / GLYCOLAX) 17 g packet Take 17 g by mouth 2 (two) times daily. (Patient not taking: No sig reported) 14 each 0   No current facility-administered medications for this visit.    Allergies as of 04/11/2020 - Review Complete 04/11/2020  Allergen Reaction Noted  . Bee venom Anaphylaxis 11/04/2010  . Tramadol Hives 11/04/2010  . Asa [aspirin] Other (See Comments) 08/14/2015  . Latex Itching 12/26/2018  .  Peppermint flavor Other (See Comments) 03/01/2018    Family History  Adopted: Yes  Problem Relation Age of Onset  . Heart disease Other   . Colon cancer Neg Hx     Social History   Socioeconomic History  . Marital status: Significant Other    Spouse name: Not on file  . Number of children: Not on file  . Years of education: Not on file  . Highest education level: Not on file  Occupational History  . Not on file  Tobacco Use  . Smoking status: Current Every Day Smoker    Packs/day: 1.00    Years: 31.00    Pack years: 31.00    Types: Cigarettes  . Smokeless tobacco: Never Used  Vaping Use  . Vaping Use: Never used  Substance and Sexual Activity  . Alcohol use: Yes    Comment: occ  . Drug use: No  . Sexual activity: Yes    Birth control/protection: Surgical    Comment: tubal  Other Topics Concern  . Not on file  Social History Narrative  . Not on file   Social Determinants of Health   Financial Resource Strain: Not on file  Food Insecurity: Not on file  Transportation Needs: Not on file  Physical Activity: Not on file  Stress: Not on file  Social Connections: Not on file    Review of Systems: Gen: Denies fever, chills, anorexia. Denies fatigue, weakness, weight loss.  CV: Denies chest pain, palpitations, syncope, peripheral edema, and claudication. Resp: Denies dyspnea at rest, cough, wheezing, coughing up blood, and pleurisy. GI: see HPI Derm: Denies rash, itching, dry skin Psych: Denies depression, anxiety, memory loss, confusion. No homicidal or suicidal ideation.  Heme: Denies bruising, bleeding, and enlarged lymph nodes.  Physical Exam: BP 134/88   Pulse 78   Temp (!) 97.1 F (36.2 C)   Ht 5\' 4"  (1.626 m)   Wt 276 lb (125.2 kg)   LMP 11/11/2016   BMI 47.38 kg/m  General:   Alert and oriented. No distress noted. Pleasant and cooperative.  Head:  Normocephalic and atraumatic. Eyes:  Conjuctiva clear without scleral icterus. Mouth:  Mask in  place Abdomen:  +BS, soft, non-tender and non-distended. No rebound or guarding. No HSM or masses noted. Msk:  Symmetrical without gross deformities. Normal posture. Extremities:  Without edema. Neurologic:  Alert and  oriented x4 Psych:  Alert and cooperative. Normal mood and affect.  ASSESSMENT: Autumn [name] is a 55 y.o. female presenting today with history of constipation, GERD, and rectal bleeding due to internal hemorrhoids to discuss possible hemorrhoid banding.  She is currently declining a banding. I spoke with her at length regarding risks and benefits. We will focus on maximizing bowel regimen, as she could use more aggressive treatment. Will start LInzess 145 mcg daily. I have also provided Linzess 290 mcg if 145 mcg is not effective. Currently has been on  Linzess 72 mcg.  GERD remains controlled on omeprazole daily.    PLAN:  Increase Linzess to 145 mcg. 290 mcg provided as well if needed Call with Linzess update and will send rx with appropriate dosage that works Recommend hemorrhoid banding if patient chooses in future Continue omeprazole daily Return in 4-6 months  Annitta Needs, PhD, [facility]

## 2020-04-22 ENCOUNTER — Telehealth: Payer: Self-pay

## 2020-04-22 NOTE — Telephone Encounter (Signed)
Pt called back and said the Linzess 290 works better than the 72 mcg and she would like it called into her pharmacy.

## 2020-04-22 NOTE — Telephone Encounter (Signed)
Pt called and would like Linzess 290 mcg is the medication that pt would like sent to [facility] mail order pharmacy.

## 2020-04-22 NOTE — Telephone Encounter (Signed)
Lmom, waiting on a return call.  

## 2020-04-22 NOTE — Telephone Encounter (Signed)
Can we double check on Linzess dose? As far as I can tell, we have not increased Linzess to 290 mcg. She has been taking Linzess 72 mcg daily.

## 2020-04-23 ENCOUNTER — Other Ambulatory Visit: Payer: Self-pay | Admitting: Gastroenterology

## 2020-04-23 DIAGNOSIS — K5900 Constipation, unspecified: Secondary | ICD-10-CM

## 2020-04-23 MED ORDER — LINACLOTIDE 290 MCG PO CAPS: 290.0000 ug | ORAL_CAPSULE | Freq: Every day | ORAL | 1 refills | Status: DC

## 2020-04-23 NOTE — Telephone Encounter (Signed)
Noted  

## 2020-04-23 NOTE — Telephone Encounter (Signed)
Rx for Linzess 290 mcg sent to pharmacy.

## 2020-04-24 ENCOUNTER — Ambulatory Visit: Payer: Medicare HMO | Admitting: Gastroenterology

## 2020-09-03 ENCOUNTER — Encounter: Payer: Self-pay | Admitting: Gastroenterology

## 2020-09-03 ENCOUNTER — Other Ambulatory Visit: Payer: Self-pay

## 2020-09-03 ENCOUNTER — Ambulatory Visit (INDEPENDENT_AMBULATORY_CARE_PROVIDER_SITE_OTHER): Payer: Medicare HMO | Admitting: Gastroenterology

## 2020-09-03 ENCOUNTER — Encounter: Payer: Self-pay | Admitting: *Deleted

## 2020-09-03 ENCOUNTER — Telehealth: Payer: Self-pay | Admitting: *Deleted

## 2020-09-03 DIAGNOSIS — R1310 Dysphagia, unspecified: Secondary | ICD-10-CM

## 2020-09-03 DIAGNOSIS — R6881 Early satiety: Secondary | ICD-10-CM | POA: Insufficient documentation

## 2020-09-03 DIAGNOSIS — R140 Abdominal distension (gaseous): Secondary | ICD-10-CM | POA: Diagnosis not present

## 2020-09-03 NOTE — H&P (View-Only) (Signed)
Referring Provider: Lucia Gaskins, MD Primary Care Physician:  Pcp, No Primary GI: Dr. Abbey Chatters   Chief Complaint  Patient presents with   abdominal swelling    HPI:   Autumn [name] is a 55 y.o. female presenting today with a history of constipation, GERD, rectal bleeding due to internal hemorrhoids. Colonoscopy is up-to-date in July 2020 which was performed for rectal bleeding and revealed 3 hyperplastic polyps, external/internal hemorrhoids, tortuous left colon with recommendations to repeat colonoscopy in 10 years. She has declined a banding.   Has felt bloated chronically, which initially was felt secondary to constipation.  We have increased dosage of Linzess from 72 mcg to 290 mcg daily. She has had good results with this. Constipation resolved with this regimen. Feels bloated upper abdomen. No postprandial correlation with bloating. Nausea with meds in the morning. She notes early satiety. Present for about 4 weeks. No unexplained weight loss. Naproxen BID. Notes pill dysphagia.   Past Medical History:  Diagnosis Date   Anxiety    Panic attacks- "cries"   Arthritis    Complication of anesthesia    had some hallucations   Fibroids, submucosal 12/02/2016   GERD (gastroesophageal reflux disease)    H/O colonoscopy    Hypertension    Rectal bleeding    in setting of hemorrhoids. Colonoscopy in July 2020   Shortness of breath dyspnea    with exertion   Thickened endometrium 12/02/2016    Past Surgical History:  Procedure Laterality Date   BREAST LUMPECTOMY Right    CESAREAN SECTION     x3   COLONOSCOPY     COLONOSCOPY WITH PROPOFOL N/A 08/23/2018   PROPOFOL;  Surgeon: Danie Binder, MD; three 2-4 mm polyps in the rectum and sigmoid colon, external and internal hemorrhoids, tortuous left colon.  Pathology with 3 hyperplastic polyps.  Next colonoscopy in 2030.   DENTAL SURGERY     MASS EXCISION N/A 12/06/2017   Procedure: EXCISION 1CM CYST ON FACE;  Surgeon:  Virl Cagey, MD;  Location: [facility];  Service: General;  Laterality: N/A;   MULTIPLE EXTRACTIONS WITH ALVEOLOPLASTY Bilateral 04/12/2015   Procedure: BILATERAL MULTIPLE EXTRACTIONS NUMBERS TWENTY, TWENTY ONE, TWENTY TWO, TWENTY THREE, TWENTY FOUR, TWENTY FIVE, TWENTY EIGHT, TWENTY NINE WITH ALVEOLOPLASTY;  Surgeon: Diona Browner, DDS;  Location: [place];  Service: Oral Surgery;  Laterality: Bilateral;   POLYPECTOMY  08/23/2018   Procedure: POLYPECTOMY;  Surgeon: Danie Binder, MD;  [facility];  Service: Endoscopy;;   TUBAL LIGATION      Current Outpatient Medications  Medication Sig Dispense Refill   hydrocortisone (ANUSOL-HC) 2.5 % rectal cream Place 1 application rectally 2 (two) times daily. As needed     labetalol (NORMODYNE) 200 MG tablet Take 200 mg by mouth 2 (two) times daily.     linaclotide (LINZESS) 290 MCG CAPS capsule Take 1 capsule (290 mcg total) by mouth daily before breakfast. 90 capsule 1   Multiple Vitamin (MULTIVITAMIN PO) Take 1 tablet by mouth daily.     naproxen (NAPROSYN) 500 MG tablet Take 500 mg by mouth 2 (two) times a day.     omeprazole (PRILOSEC) 20 MG capsule Take 1 capsule (20 mg total) by mouth daily before breakfast. 30 capsule 5   No current facility-administered medications for this visit.    Allergies as of 09/03/2020 - Review Complete 09/03/2020  Allergen Reaction Noted   Bee venom Anaphylaxis 11/04/2010   Tramadol Hives 11/04/2010   Asa [aspirin]  Other (See Comments) 08/14/2015   Latex Itching 12/26/2018   Peppermint flavor Other (See Comments) 03/01/2018    Family History  Adopted: Yes  Problem Relation Age of Onset   Heart disease Other    Colon cancer Neg Hx     Social History   Socioeconomic History   Marital status: Significant Other    Spouse name: Not on file   Number of children: Not on file   Years of education: Not on file   Highest education level: Not on file  Occupational History   Not on file  Tobacco  Use   Smoking status: Every Day    Packs/day: 1.00    Years: 31.00    Pack years: 31.00    Types: Cigarettes   Smokeless tobacco: Never  Vaping Use   Vaping Use: Never used  Substance and Sexual Activity   Alcohol use: Yes    Comment: occ   Drug use: No   Sexual activity: Yes    Birth control/protection: Surgical    Comment: tubal  Other Topics Concern   Not on file  Social History Narrative   Not on file   Social Determinants of Health   Financial Resource Strain: Not on file  Food Insecurity: Not on file  Transportation Needs: Not on file  Physical Activity: Not on file  Stress: Not on file  Social Connections: Not on file    Review of Systems: Gen: Denies fever, chills, anorexia. Denies fatigue, weakness, weight loss.  CV: Denies chest pain, palpitations, syncope, peripheral edema, and claudication. Resp: Denies dyspnea at rest, cough, wheezing, coughing up blood, and pleurisy. GI: see HPI Derm: Denies rash, itching, dry skin Psych: Denies depression, anxiety, memory loss, confusion. No homicidal or suicidal ideation.  Heme: Denies bruising, bleeding, and enlarged lymph nodes.  Physical Exam: BP (!) 167/80   Pulse 84   Temp 97.7 F (36.5 C) (Temporal)   Ht '5\' 4"'$  (1.626 m)   Wt 282 lb 12.8 oz (128.3 kg)   LMP 11/11/2016   BMI 48.54 kg/m  General:   Alert and oriented. No distress noted. Pleasant and cooperative.  Head:  Normocephalic and atraumatic. Eyes:  Conjuctiva clear without scleral icterus. Mouth:  mask in place Lungs: clear bilaterally Cardiac: S1 S2 present without murmurs Abdomen:  +BS, soft, non-tender and non-distended. No rebound or guarding. No HSM or masses noted. Msk:  Symmetrical without gross deformities. Normal posture. Extremities:  Without edema. Neurologic:  Alert and  oriented x4 Psych:  Alert and cooperative. Normal mood and affect.  ASSESSMENT: Autumn [name] is a 55 y.o. female presenting today with a history of  constipation, GERD, rectal bleeding due to internal hemorrhoids, colonoscopy on file from July 2020, and chronic bloating. Now with early satiety and notes pill dysphagia.  Early satiety present for approximately last month. No associated weight loss or postprandial pain. No improvement in bloating despite constipation being resolved.  No significant nausea. Pill dysphagia noted recently but no soft or solid food dysphagia. Takes Naproxen BID daily and continues on omeprazole daily.  Will pursue EGD +/- dilation and possible small bowel biopsies.   PLAN:  Proceed with upper endoscopy/dilation/small bowel biopsies by Dr. Abbey Chatters in near future: the risks, benefits, and alternatives have been discussed with the patient in detail. The patient states understanding and desires to proceed.   Continue Linzess 290 mcg daily  Continue omeprazole daily  Follow-up thereafter  Annitta Needs, PhD, ANP-BC [facility] Gastroenterology

## 2020-09-03 NOTE — Patient Instructions (Signed)
We are arranging an endoscopy with dilation and small bowel biopsies in the near future!  Continue omeprazole once daily and Linzess once daily.  We will see you after the procedures!  I enjoyed seeing you again today! As you know, I value our relationship and want to provide genuine, compassionate, and quality care. I welcome your feedback. If you receive a survey regarding your visit,  I greatly appreciate you taking time to fill this out. See you next time!  Annitta Needs, PhD, ANP-BC [facility] Gastroenter[facility]

## 2020-09-03 NOTE — Telephone Encounter (Signed)
PA approved via [employer] for EGD/DIL. Auth# [id number], DOS Sep 16 2020 - Oct 16 2020

## 2020-09-03 NOTE — Progress Notes (Signed)
Referring Provider: Lucia Gaskins, MD Primary Care Physician:  Pcp, No Primary GI: Dr. Abbey Chatters   Chief Complaint  Patient presents with   abdominal swelling    HPI:   Autumn [name] is a 55 y.o. female presenting today with a history of constipation, GERD, rectal bleeding due to internal hemorrhoids. Colonoscopy is up-to-date in July 2020 which was performed for rectal bleeding and revealed 3 hyperplastic polyps, external/internal hemorrhoids, tortuous left colon with recommendations to repeat colonoscopy in 10 years. She has declined a banding.   Has felt bloated chronically, which initially was felt secondary to constipation.  We have increased dosage of Linzess from 72 mcg to 290 mcg daily. She has had good results with this. Constipation resolved with this regimen. Feels bloated upper abdomen. No postprandial correlation with bloating. Nausea with meds in the morning. She notes early satiety. Present for about 4 weeks. No unexplained weight loss. Naproxen BID. Notes pill dysphagia.   Past Medical History:  Diagnosis Date   Anxiety    Panic attacks- "cries"   Arthritis    Complication of anesthesia    had some hallucations   Fibroids, submucosal 12/02/2016   GERD (gastroesophageal reflux disease)    H/O colonoscopy    Hypertension    Rectal bleeding    in setting of hemorrhoids. Colonoscopy in July 2020   Shortness of breath dyspnea    with exertion   Thickened endometrium 12/02/2016    Past Surgical History:  Procedure Laterality Date   BREAST LUMPECTOMY Right    CESAREAN SECTION     x3   COLONOSCOPY     COLONOSCOPY WITH PROPOFOL N/A 08/23/2018   PROPOFOL;  Surgeon: Danie Binder, MD; three 2-4 mm polyps in the rectum and sigmoid colon, external and internal hemorrhoids, tortuous left colon.  Pathology with 3 hyperplastic polyps.  Next colonoscopy in 2030.   DENTAL SURGERY     MASS EXCISION N/A 12/06/2017   Procedure: EXCISION 1CM CYST ON FACE;  Surgeon:  Virl Cagey, MD;  Location: [facility];  Service: General;  Laterality: N/A;   MULTIPLE EXTRACTIONS WITH ALVEOLOPLASTY Bilateral 04/12/2015   Procedure: BILATERAL MULTIPLE EXTRACTIONS NUMBERS TWENTY, TWENTY ONE, TWENTY TWO, TWENTY THREE, TWENTY FOUR, TWENTY FIVE, TWENTY EIGHT, TWENTY NINE WITH ALVEOLOPLASTY;  Surgeon: Diona Browner, DDS;  Location: [place];  Service: Oral Surgery;  Laterality: Bilateral;   POLYPECTOMY  08/23/2018   Procedure: POLYPECTOMY;  Surgeon: Danie Binder, MD;  [facility];  Service: Endoscopy;;   TUBAL LIGATION      Current Outpatient Medications  Medication Sig Dispense Refill   hydrocortisone (ANUSOL-HC) 2.5 % rectal cream Place 1 application rectally 2 (two) times daily. As needed     labetalol (NORMODYNE) 200 MG tablet Take 200 mg by mouth 2 (two) times daily.     linaclotide (LINZESS) 290 MCG CAPS capsule Take 1 capsule (290 mcg total) by mouth daily before breakfast. 90 capsule 1   Multiple Vitamin (MULTIVITAMIN PO) Take 1 tablet by mouth daily.     naproxen (NAPROSYN) 500 MG tablet Take 500 mg by mouth 2 (two) times a day.     omeprazole (PRILOSEC) 20 MG capsule Take 1 capsule (20 mg total) by mouth daily before breakfast. 30 capsule 5   No current facility-administered medications for this visit.    Allergies as of 09/03/2020 - Review Complete 09/03/2020  Allergen Reaction Noted   Bee venom Anaphylaxis 11/04/2010   Tramadol Hives 11/04/2010   Asa [aspirin]  Other (See Comments) 08/14/2015   Latex Itching 12/26/2018   Peppermint flavor Other (See Comments) 03/01/2018    Family History  Adopted: Yes  Problem Relation Age of Onset   Heart disease Other    Colon cancer Neg Hx     Social History   Socioeconomic History   Marital status: Significant Other    Spouse name: Not on file   Number of children: Not on file   Years of education: Not on file   Highest education level: Not on file  Occupational History   Not on file  Tobacco  Use   Smoking status: Every Day    Packs/day: 1.00    Years: 31.00    Pack years: 31.00    Types: Cigarettes   Smokeless tobacco: Never  Vaping Use   Vaping Use: Never used  Substance and Sexual Activity   Alcohol use: Yes    Comment: occ   Drug use: No   Sexual activity: Yes    Birth control/protection: Surgical    Comment: tubal  Other Topics Concern   Not on file  Social History Narrative   Not on file   Social Determinants of Health   Financial Resource Strain: Not on file  Food Insecurity: Not on file  Transportation Needs: Not on file  Physical Activity: Not on file  Stress: Not on file  Social Connections: Not on file    Review of Systems: Gen: Denies fever, chills, anorexia. Denies fatigue, weakness, weight loss.  CV: Denies chest pain, palpitations, syncope, peripheral edema, and claudication. Resp: Denies dyspnea at rest, cough, wheezing, coughing up blood, and pleurisy. GI: see HPI Derm: Denies rash, itching, dry skin Psych: Denies depression, anxiety, memory loss, confusion. No homicidal or suicidal ideation.  Heme: Denies bruising, bleeding, and enlarged lymph nodes.  Physical Exam: BP (!) 167/80   Pulse 84   Temp 97.7 F (36.5 C) (Temporal)   Ht '5\' 4"'$  (1.626 m)   Wt 282 lb 12.8 oz (128.3 kg)   LMP 11/11/2016   BMI 48.54 kg/m  General:   Alert and oriented. No distress noted. Pleasant and cooperative.  Head:  Normocephalic and atraumatic. Eyes:  Conjuctiva clear without scleral icterus. Mouth:  mask in place Lungs: clear bilaterally Cardiac: S1 S2 present without murmurs Abdomen:  +BS, soft, non-tender and non-distended. No rebound or guarding. No HSM or masses noted. Msk:  Symmetrical without gross deformities. Normal posture. Extremities:  Without edema. Neurologic:  Alert and  oriented x4 Psych:  Alert and cooperative. Normal mood and affect.  ASSESSMENT: [name] is a 55 y.o. female presenting today with a history of  constipation, GERD, rectal bleeding due to internal hemorrhoids, colonoscopy on file from July 2020, and chronic bloating. Now with early satiety and notes pill dysphagia.  Early satiety present for approximately last month. No associated weight loss or postprandial pain. No improvement in bloating despite constipation being resolved.  No significant nausea. Pill dysphagia noted recently but no soft or solid food dysphagia. Takes Naproxen BID daily and continues on omeprazole daily.  Will pursue EGD +/- dilation and possible small bowel biopsies.   PLAN:  Proceed with upper endoscopy/dilation/small bowel biopsies by Dr. Abbey Chatters in near future: the risks, benefits, and alternatives have been discussed with the patient in detail. The patient states understanding and desires to proceed.   Continue Linzess 290 mcg daily  Continue omeprazole daily  Follow-up thereafter  Annitta Needs, PhD, ANP-BC [facility]

## 2020-09-03 NOTE — Telephone Encounter (Signed)
Called pt and aware pre-op appt scheduled for 8/19 at 2:15pm. She voiced understanding had no questions.

## 2020-09-06 ENCOUNTER — Other Ambulatory Visit: Payer: Self-pay | Admitting: Gastroenterology

## 2020-09-06 DIAGNOSIS — K219 Gastro-esophageal reflux disease without esophagitis: Secondary | ICD-10-CM

## 2020-09-10 ENCOUNTER — Other Ambulatory Visit: Payer: Self-pay | Admitting: Gastroenterology

## 2020-09-10 DIAGNOSIS — K5900 Constipation, unspecified: Secondary | ICD-10-CM

## 2020-09-10 NOTE — Patient Instructions (Signed)
Autumn [name]  09/10/2020     '@PREFPERIOPPHARMACY'$ @   Your procedure is scheduled on  09/16/2020.   Report to [facility] at  1045 AM   Call this number if you have problems the morning of surgery:  [phone]   Remember:  Follow the diet instructions given to you by the office.    Take these medicines the morning of surgery with A SIP OF WATER                      labetolol, mobic (if needed), prilosec.     Do not wear jewelry, make-up or nail polish.  Do not wear lotions, powders, or perfumes, or deodorant.  Do not shave 48 hours prior to surgery.  Men may shave face and neck.  Do not bring valuables to the hospital.  [facility] is not responsible for any belongings or valuables.  Contacts, dentures or bridgework may not be worn into surgery.  Leave your suitcase in the car.  After surgery it may be brought to your room.  For patients admitted to the hospital, discharge time will be determined by your treatment team.  Patients discharged the day of surgery will not be allowed to drive home and must have someone with them for 24 hours.    Special instructions:   DO NOT smoke tobacco or vape for 24 hours before your procedure.  Please read over the following fact sheets that you were given. Anesthesia Post-op Instructions and Care and Recovery After Surgery      Upper Endoscopy, Adult, Care After This sheet gives you information about how to care for yourself after your procedure. Your health care provider may also give you more specific instructions. If you have problems or questions, contact your health careprovider. What can I expect after the procedure? After the procedure, it is common to have: A sore throat. Mild stomach pain or discomfort. Bloating. Nausea. Follow these instructions at home:  Follow instructions from your health care provider about what to eat or drink after your procedure. Return to your normal activities as told by  your health care provider. Ask your health care provider what activities are safe for you. Take over-the-counter and prescription medicines only as told by your health care provider. If you were given a sedative during the procedure, it can affect you for several hours. Do not drive or operate machinery until your health care provider says that it is safe. Keep all follow-up visits as told by your health care provider. This is important. Contact a health care provider if you have: A sore throat that lasts longer than one day. Trouble swallowing. Get help right away if: You vomit blood or your vomit looks like coffee grounds. You have: A fever. Bloody, black, or tarry stools. A severe sore throat or you cannot swallow. Difficulty breathing. Severe pain in your chest or abdomen. Summary After the procedure, it is common to have a sore throat, mild stomach discomfort, bloating, and nausea. If you were given a sedative during the procedure, it can affect you for several hours. Do not drive or operate machinery until your health care provider says that it is safe. Follow instructions from your health care provider about what to eat or drink after your procedure. Return to your normal activities as told by your health care provider. This information is not intended to replace advice given to you by your health care provider.  Make sure you discuss any questions you have with your healthcare provider. Document Revised: 01/10/2019 Document Reviewed: 06/14/2017 Elsevier Patient Education  2022 [employer]. https://www.asge.org/home/for-patients/patient-information/understanding-eso-dilation-updated">  Esophageal Dilatation Esophageal dilatation, also called esophageal dilation, is a procedure to widen or open a blocked or narrowed part of the esophagus. The esophagus is the part of the body that moves food and liquid from the mouth to the stomach. You may need this procedure if: You have a buildup  of scar tissue in your esophagus that makes it difficult, painful, or impossible to swallow. This can be caused by gastroesophageal reflux disease (GERD). You have cancer of the esophagus. There is a problem with how food moves through your esophagus. In some cases, you may need this procedure repeated at a later time to dilatethe esophagus gradually. Tell a health care provider about: Any allergies you have. All medicines you are taking, including vitamins, herbs, eye drops, creams, and over-the-counter medicines. Any problems you or family members have had with anesthetic medicines. Any blood disorders you have. Any surgeries you have had. Any medical conditions you have. Any antibiotic medicines you are required to take before dental procedures. Whether you are pregnant or may be pregnant. What are the risks? Generally, this is a safe procedure. However, problems may occur, including: Bleeding due to a tear in the lining of the esophagus. A hole, or perforation, in the esophagus. What happens before the procedure? Ask your health care provider about: Changing or stopping your regular medicines. This is especially important if you are taking diabetes medicines or blood thinners. Taking medicines such as aspirin and ibuprofen. These medicines can thin your blood. Do not take these medicines unless your health care provider tells you to take them. Taking over-the-counter medicines, vitamins, herbs, and supplements. Follow instructions from your health care provider about eating or drinking restrictions. Plan to have a responsible adult take you home from the hospital or clinic. Plan to have a responsible adult care for you for the time you are told after you leave the hospital or clinic. This is important. What happens during the procedure? You may be given a medicine to help you relax (sedative). A numbing medicine may be sprayed into the back of your throat, or you may gargle the  medicine. Your health care provider may perform the dilatation using various surgical instruments, such as: Simple dilators. This instrument is carefully placed in the esophagus to stretch it. Guided wire bougies. This involves using an endoscope to insert a wire into the esophagus. A dilator is passed over this wire to enlarge the esophagus. Then the wire is removed. Balloon dilators. An endoscope with a small balloon is inserted into the esophagus. The balloon is inflated to stretch the esophagus and open it up. The procedure may vary among health care providers and hospitals. What can I expect after the procedure? Your blood pressure, heart rate, breathing rate, and blood oxygen level will be monitored until you leave the hospital or clinic. Your throat may feel slightly sore and numb. This will get better over time. You will not be allowed to eat or drink until your throat is no longer numb. When you are able to drink, urinate, and sit on the edge of the bed without nausea or dizziness, you may be able to return home. Follow these instructions at home: Take over-the-counter and prescription medicines only as told by your health care provider. If you were given a sedative during the procedure, it can affect you for  several hours. Do not drive or operate machinery until your health care provider says that it is safe. Plan to have a responsible adult care for you for the time you are told. This is important. Follow instructions from your health care provider about any eating or drinking restrictions. Do not use any products that contain nicotine or tobacco, such as cigarettes, e-cigarettes, and chewing tobacco. If you need help quitting, ask your health care provider. Keep all follow-up visits. This is important. Contact a health care provider if: You have a fever. You have pain that is not relieved by medicine. Get help right away if: You have chest pain. You have trouble breathing. You  have trouble swallowing. You vomit blood. You have black, tarry, or bloody stools. These symptoms may represent a serious problem that is an emergency. Do not wait to see if the symptoms will go away. Get medical help right away. Call your local emergency services (911 in the [place] Do not drive yourself to the hospital. Summary Esophageal dilatation, also called esophageal dilation, is a procedure to widen or open a blocked or narrowed part of the esophagus. Plan to have a responsible adult take you home from the hospital or clinic. For this procedure, a numbing medicine may be sprayed into the back of your throat, or you may gargle the medicine. Do not drive or operate machinery until your health care provider says that it is safe. This information is not intended to replace advice given to you by your health care provider. Make sure you discuss any questions you have with your healthcare provider. Document Revised: 05/31/2019 Document Reviewed: 05/31/2019 Elsevier Patient Education  [place] After This sheet gives you information about how to care for yourself after your procedure. Your health care provider may also give you more specific instructions. If you have problems or questions, contact your health careprovider. What can I expect after the procedure? After the procedure, it is common to have: Tiredness. Forgetfulness about what happened after the procedure. Impaired judgment for important decisions. Nausea or vomiting. Some difficulty with balance. Follow these instructions at home: For the time period you were told by your health care provider:     Rest as needed. Do not participate in activities where you could fall or become injured. Do not drive or use machinery. Do not drink alcohol. Do not take sleeping pills or medicines that cause drowsiness. Do not make important decisions or sign legal documents. Do not take care of  children on your own. Eating and drinking Follow the diet that is recommended by your health care provider. Drink enough fluid to keep your urine pale yellow. If you vomit: Drink water, juice, or soup when you can drink without vomiting. Make sure you have little or no nausea before eating solid foods. General instructions Have a responsible adult stay with you for the time you are told. It is important to have someone help care for you until you are awake and alert. Take over-the-counter and prescription medicines only as told by your health care provider. If you have sleep apnea, surgery and certain medicines can increase your risk for breathing problems. Follow instructions from your health care provider about wearing your sleep device: Anytime you are sleeping, including during daytime naps. While taking prescription pain medicines, sleeping medicines, or medicines that make you drowsy. Avoid smoking. Keep all follow-up visits as told by your health care provider. This is important. Contact a health care provider  if: You keep feeling nauseous or you keep vomiting. You feel light-headed. You are still sleepy or having trouble with balance after 24 hours. You develop a rash. You have a fever. You have redness or swelling around the IV site. Get help right away if: You have trouble breathing. You have new-onset confusion at home. Summary For several hours after your procedure, you may feel tired. You may also be forgetful and have poor judgment. Have a responsible adult stay with you for the time you are told. It is important to have someone help care for you until you are awake and alert. Rest as told. Do not drive or operate machinery. Do not drink alcohol or take sleeping pills. Get help right away if you have trouble breathing, or if you suddenly become confused. This information is not intended to replace advice given to you by your health care provider. Make sure you discuss any  questions you have with your healthcare provider. Document Revised: 09/28/2019 Document Reviewed: 12/15/2018 Elsevier Patient Education  2022 [employer].

## 2020-09-13 ENCOUNTER — Encounter (HOSPITAL_COMMUNITY)
Admission: RE | Admit: 2020-09-13 | Discharge: 2020-09-13 | Disposition: A | Discharge status: Home / Self Care | Payer: Medicare HMO | Source: Ambulatory Visit | Attending: Internal Medicine | Admitting: Internal Medicine

## 2020-09-13 ENCOUNTER — Ambulatory Visit (HOSPITAL_COMMUNITY): Payer: Medicare HMO | Admitting: Anesthesiology

## 2020-09-13 ENCOUNTER — Other Ambulatory Visit: Payer: Self-pay

## 2020-09-13 DIAGNOSIS — Z01810 Encounter for preprocedural cardiovascular examination: Secondary | ICD-10-CM | POA: Insufficient documentation

## 2020-09-16 ENCOUNTER — Ambulatory Visit (HOSPITAL_COMMUNITY)
Admission: RE | Admit: 2020-09-16 | Discharge: 2020-09-16 | Disposition: A | Discharge status: Home / Self Care | Payer: Medicare HMO | Attending: Internal Medicine | Admitting: Internal Medicine

## 2020-09-16 ENCOUNTER — Telehealth: Payer: Self-pay

## 2020-09-16 ENCOUNTER — Encounter (HOSPITAL_COMMUNITY)
Admission: RE | Disposition: A | Discharge status: Home / Self Care | Payer: Self-pay | Source: Home / Self Care | Attending: Internal Medicine

## 2020-09-16 ENCOUNTER — Encounter (HOSPITAL_COMMUNITY): Payer: Self-pay

## 2020-09-16 DIAGNOSIS — R6881 Early satiety: Secondary | ICD-10-CM | POA: Insufficient documentation

## 2020-09-16 DIAGNOSIS — R0602 Shortness of breath: Secondary | ICD-10-CM

## 2020-09-16 DIAGNOSIS — R140 Abdominal distension (gaseous): Secondary | ICD-10-CM | POA: Insufficient documentation

## 2020-09-16 DIAGNOSIS — Z538 Procedure and treatment not carried out for other reasons: Secondary | ICD-10-CM | POA: Insufficient documentation

## 2020-09-16 DIAGNOSIS — R221 Localized swelling, mass and lump, neck: Secondary | ICD-10-CM

## 2020-09-16 SURGERY — ESOPHAGOGASTRODUODENOSCOPY (EGD) WITH PROPOFOL
Anesthesia: Monitor Anesthesia Care

## 2020-09-16 MED ORDER — LACTATED RINGERS IV SOLN: INTRAVENOUS | Status: DC

## 2020-09-16 NOTE — Telephone Encounter (Signed)
PA for CT neck submitted via [employer] website. [employer]# [id number], valid 09/16/20-10/16/20.   Unable to schedule CT at this time d/t pt is currently admitted in chart.

## 2020-09-16 NOTE — Progress Notes (Signed)
Dr Abbey Chatters at bedside to talk with pt. Informed pt that his office will be contacting her to schedule CT. Pt voiced understanding.  Monitors d/c'd. IV d/c'd. Drsg to site. Dressed self. D/C to home in good condition.

## 2020-09-16 NOTE — Telephone Encounter (Signed)
CT neck scheduled for 09/17/20, arrive at 3:15pm. Liquids only for 4 hours before test.  Called and informed pt of CT appt. EGD/DIL rescheduled to 10/03/20 at 7:30am. Orders entered.   PA previously obtained. [employer]# [id number], valid 09/16/20-10/16/20.

## 2020-09-16 NOTE — Telephone Encounter (Addendum)
Dr. Abbey Chatters called office, pt was scheduled for EGD/DIL today but anesthesia cancelled procedure d/t pt is having neck swelling. Dr. Abbey Chatters wants stat CT of neck for neck swelling and shortness of breath. Per Dr. Abbey Chatters CT doesn't have to be done today. EGD/DIL will need to be rescheduled.

## 2020-09-16 NOTE — Telephone Encounter (Signed)
Pre-op appt 10/01/20. Appt letter mailed with procedure instructions.

## 2020-09-16 NOTE — Telephone Encounter (Signed)
Jennifer at [facility] CT advised to order CT soft tissue neck with contrast.

## 2020-09-16 NOTE — Progress Notes (Signed)
Patient has shortness of breathing  when lying flat, fells like something compressing her neck and swelling in her lower apart of the neck getting worse last 2 weeks, discussed with Dr. Abbey Chatters,.  Procedure cancelled and will be rescheduled after neck imaging.

## 2020-09-16 NOTE — Progress Notes (Signed)
Dr Abbey Chatters at bedside to talk and evaluate pt re: mass in right neck. Will order CT scan of neck. Office will schedule. EGD procedure canceled.

## 2020-09-17 ENCOUNTER — Encounter (HOSPITAL_COMMUNITY): Payer: Self-pay

## 2020-09-17 ENCOUNTER — Other Ambulatory Visit: Payer: Self-pay

## 2020-09-17 ENCOUNTER — Ambulatory Visit (HOSPITAL_COMMUNITY)
Admission: RE | Admit: 2020-09-17 | Discharge: 2020-09-17 | Disposition: A | Discharge status: Home / Self Care | Payer: Medicare HMO | Source: Ambulatory Visit | Attending: Internal Medicine | Admitting: Internal Medicine

## 2020-09-17 DIAGNOSIS — R0602 Shortness of breath: Secondary | ICD-10-CM | POA: Insufficient documentation

## 2020-09-17 DIAGNOSIS — J351 Hypertrophy of tonsils: Secondary | ICD-10-CM

## 2020-09-17 DIAGNOSIS — R221 Localized swelling, mass and lump, neck: Secondary | ICD-10-CM | POA: Insufficient documentation

## 2020-09-17 LAB — POCT I-STAT CREATININE: Creatinine, Ser: 1 mg/dL (ref 0.44–1.00)

## 2020-09-17 MED ORDER — IOHEXOL 350 MG/ML SOLN
100.0000 mL | Freq: Once | INTRAVENOUS | Status: AC | PRN
  Administered 2020-09-17: 60 mL via INTRAVENOUS

## 2020-09-19 ENCOUNTER — Telehealth: Payer: Self-pay | Admitting: Internal Medicine

## 2020-09-19 NOTE — Telephone Encounter (Signed)
Autumn [name] I sent this to you because [name] besides you are the only ones who has seen this pt. She states her bloating has gotten worse since her procedure. She states she took some Beano but it didn't help. I asked her was it different from the times before and she stated yes. The difference is she can't get rid of it this time. She is able to use the bathroom she is just full of "air". Please advise

## 2020-09-19 NOTE — Telephone Encounter (Signed)
Phoned the pt back and I asked what procedure was she referring to and she stated she hasn't had one......Marland Kitchen well I advised her to try using Gas-X and just follow the instructions on the package. Also read her the list of common gas producing items, drink lots of water and call back if any ongoing problems next week. The pt expressed understanding.

## 2020-09-19 NOTE — Telephone Encounter (Signed)
I am not sure what procedure she is referring to. Her EGD was cancelled and rescheduled. Maybe she is referring to the CT scan?   She can try using Gas-X.  Follow the instructions on the package.  Avoid common gas producing items including cabbage, broccoli, Brussels sprouts, beans, artificial sweeteners, carbonated beverages, drinking through a straw, hard candy, chewing gum.  Drink lots of water.  Call back if ongoing problems next week. Will defer additional recommendations to [name], NP.

## 2020-09-19 NOTE — Telephone Encounter (Signed)
Noted  

## 2020-09-19 NOTE — Telephone Encounter (Signed)
[phone]  PATIENT CALLED AND SAID THAT SINCE HER PROCEDURE SHE HAS BEEN BLOATED AND SHE FEELS FULL OF AIR AND CAN GET NO RELIEF.

## 2020-09-26 NOTE — Patient Instructions (Signed)
BERLENE CLAUDE  09/26/2020     '@PREFPERIOPPHARMACY'$ @   Your procedure is scheduled on  10/03/2020.   Report to [facility] at  [phone] A.M.   Call this number if you have problems the morning of surgery:  [phone]   Remember:  Follow the diet instructions given to you by the office.    Take these medicines the morning of surgery with A SIP OF WATER          labetolol, meloxicam (if needed), prilosec.     Do not wear jewelry, make-up or nail polish.  Do not wear lotions, powders, or perfumes, or deodorant.  Do not shave 48 hours prior to surgery.  Men may shave face and neck.  Do not bring valuables to the hospital.  [facility] is not responsible for any belongings or valuables.  Contacts, dentures or bridgework may not be worn into surgery.  Leave your suitcase in the car.  After surgery it may be brought to your room.  For patients admitted to the hospital, discharge time will be determined by your treatment team.  Patients discharged the day of surgery will not be allowed to drive home and must have someone with them for 24 hours.    Special instructions:   DO NOT smoke tobacco or vape for 24 hours before your procedure.  Please read over the following fact sheets that you were given. Anesthesia Post-op Instructions and Care and Recovery After Surgery      Upper Endoscopy, Adult, Care After This sheet gives you information about how to care for yourself after your procedure. Your health care provider may also give you more specific instructions. If you have problems or questions, contact your health care provider. What can I expect after the procedure? After the procedure, it is common to have: A sore throat. Mild stomach pain or discomfort. Bloating. Nausea. Follow these instructions at home:  Follow instructions from your health care provider about what to eat or drink after your procedure. Return to your normal activities as told by your  health care provider. Ask your health care provider what activities are safe for you. Take over-the-counter and prescription medicines only as told by your health care provider. If you were given a sedative during the procedure, it can affect you for several hours. Do not drive or operate machinery until your health care provider says that it is safe. Keep all follow-up visits as told by your health care provider. This is important. Contact a health care provider if you have: A sore throat that lasts longer than one day. Trouble swallowing. Get help right away if: You vomit blood or your vomit looks like coffee grounds. You have: A fever. Bloody, black, or tarry stools. A severe sore throat or you cannot swallow. Difficulty breathing. Severe pain in your chest or abdomen. Summary After the procedure, it is common to have a sore throat, mild stomach discomfort, bloating, and nausea. If you were given a sedative during the procedure, it can affect you for several hours. Do not drive or operate machinery until your health care provider says that it is safe. Follow instructions from your health care provider about what to eat or drink after your procedure. Return to your normal activities as told by your health care provider. This information is not intended to replace advice given to you by your health care provider. Make sure you discuss any questions you have with your health  care provider. Document Revised: 01/10/2019 Document Reviewed: 06/14/2017 Elsevier Patient Education  2022 [employer]. Esophageal Dilatation Esophageal dilatation, also called esophageal dilation, is a procedure to widen or open a blocked or narrowed part of the esophagus. The esophagus is the part of the body that moves food and liquid from the mouth to the stomach. You may need this procedure if: You have a buildup of scar tissue in your esophagus that makes it difficult, painful, or impossible to swallow. This can  be caused by gastroesophageal reflux disease (GERD). You have cancer of the esophagus. There is a problem with how food moves through your esophagus. In some cases, you may need this procedure repeated at a later time to dilate the esophagus gradually. Tell a health care provider about: Any allergies you have. All medicines you are taking, including vitamins, herbs, eye drops, creams, and over-the-counter medicines. Any problems you or family members have had with anesthetic medicines. Any blood disorders you have. Any surgeries you have had. Any medical conditions you have. Any antibiotic medicines you are required to take before dental procedures. Whether you are pregnant or may be pregnant. What are the risks? Generally, this is a safe procedure. However, problems may occur, including: Bleeding due to a tear in the lining of the esophagus. A hole, or perforation, in the esophagus. What happens before the procedure? Ask your health care provider about: Changing or stopping your regular medicines. This is especially important if you are taking diabetes medicines or blood thinners. Taking medicines such as aspirin and ibuprofen. These medicines can thin your blood. Do not take these medicines unless your health care provider tells you to take them. Taking over-the-counter medicines, vitamins, herbs, and supplements. Follow instructions from your health care provider about eating or drinking restrictions. Plan to have a responsible adult take you home from the hospital or clinic. Plan to have a responsible adult care for you for the time you are told after you leave the hospital or clinic. This is important. What happens during the procedure? You may be given a medicine to help you relax (sedative). A numbing medicine may be sprayed into the back of your throat, or you may gargle the medicine. Your health care provider may perform the dilatation using various surgical instruments, such  as: Simple dilators. This instrument is carefully placed in the esophagus to stretch it. Guided wire bougies. This involves using an endoscope to insert a wire into the esophagus. A dilator is passed over this wire to enlarge the esophagus. Then the wire is removed. Balloon dilators. An endoscope with a small balloon is inserted into the esophagus. The balloon is inflated to stretch the esophagus and open it up. The procedure may vary among health care providers and hospitals. What can I expect after the procedure? Your blood pressure, heart rate, breathing rate, and blood oxygen level will be monitored until you leave the hospital or clinic. Your throat may feel slightly sore and numb. This will get better over time. You will not be allowed to eat or drink until your throat is no longer numb. When you are able to drink, urinate, and sit on the edge of the bed without nausea or dizziness, you may be able to return home. Follow these instructions at home: Take over-the-counter and prescription medicines only as told by your health care provider. If you were given a sedative during the procedure, it can affect you for several hours. Do not drive or operate machinery until your health  care provider says that it is safe. Plan to have a responsible adult care for you for the time you are told. This is important. Follow instructions from your health care provider about any eating or drinking restrictions. Do not use any products that contain nicotine or tobacco, such as cigarettes, e-cigarettes, and chewing tobacco. If you need help quitting, ask your health care provider. Keep all follow-up visits. This is important. Contact a health care provider if: You have a fever. You have pain that is not relieved by medicine. Get help right away if: You have chest pain. You have trouble breathing. You have trouble swallowing. You vomit blood. You have black, tarry, or bloody stools. These symptoms may  represent a serious problem that is an emergency. Do not wait to see if the symptoms will go away. Get medical help right away. Call your local emergency services (911 in the [place] Do not drive yourself to the hospital. Summary Esophageal dilatation, also called esophageal dilation, is a procedure to widen or open a blocked or narrowed part of the esophagus. Plan to have a responsible adult take you home from the hospital or clinic. For this procedure, a numbing medicine may be sprayed into the back of your throat, or you may gargle the medicine. Do not drive or operate machinery until your health care provider says that it is safe. This information is not intended to replace advice given to you by your health care provider. Make sure you discuss any questions you have with your health care provider. Document Revised: 05/31/2019 Document Reviewed: 05/31/2019 Elsevier Patient Education  [place] After This sheet gives you information about how to care for yourself after your procedure. Your health care provider may also give you more specific instructions. If you have problems or questions, contact your health care provider. What can I expect after the procedure? After the procedure, it is common to have: Tiredness. Forgetfulness about what happened after the procedure. Impaired judgment for important decisions. Nausea or vomiting. Some difficulty with balance. Follow these instructions at home: For the time period you were told by your health care provider:   Rest as needed. Do not participate in activities where you could fall or become injured. Do not drive or use machinery. Do not drink alcohol. Do not take sleeping pills or medicines that cause drowsiness. Do not make important decisions or sign legal documents. Do not take care of children on your own. Eating and drinking Follow the diet that is recommended by your health care  provider. Drink enough fluid to keep your urine pale yellow. If you vomit: Drink water, juice, or soup when you can drink without vomiting. Make sure you have little or no nausea before eating solid foods. General instructions Have a responsible adult stay with you for the time you are told. It is important to have someone help care for you until you are awake and alert. Take over-the-counter and prescription medicines only as told by your health care provider. If you have sleep apnea, surgery and certain medicines can increase your risk for breathing problems. Follow instructions from your health care provider about wearing your sleep device: Anytime you are sleeping, including during daytime naps. While taking prescription pain medicines, sleeping medicines, or medicines that make you drowsy. Avoid smoking. Keep all follow-up visits as told by your health care provider. This is important. Contact a health care provider if: You keep feeling nauseous or you keep vomiting. You feel  light-headed. You are still sleepy or having trouble with balance after 24 hours. You develop a rash. You have a fever. You have redness or swelling around the IV site. Get help right away if: You have trouble breathing. You have new-onset confusion at home. Summary For several hours after your procedure, you may feel tired. You may also be forgetful and have poor judgment. Have a responsible adult stay with you for the time you are told. It is important to have someone help care for you until you are awake and alert. Rest as told. Do not drive or operate machinery. Do not drink alcohol or take sleeping pills. Get help right away if you have trouble breathing, or if you suddenly become confused. This information is not intended to replace advice given to you by your health care provider. Make sure you discuss any questions you have with your health care provider. Document Revised: 09/28/2019 Document Reviewed:  12/15/2018 Elsevier Patient Education  2022 [employer].

## 2020-10-01 ENCOUNTER — Other Ambulatory Visit (HOSPITAL_COMMUNITY): Payer: Self-pay | Admitting: Family Medicine

## 2020-10-01 ENCOUNTER — Encounter (HOSPITAL_COMMUNITY)
Admission: RE | Admit: 2020-10-01 | Discharge: 2020-10-01 | Disposition: A | Discharge status: Home / Self Care | Payer: Medicare HMO | Source: Ambulatory Visit | Attending: Internal Medicine | Admitting: Internal Medicine

## 2020-10-01 ENCOUNTER — Encounter (HOSPITAL_COMMUNITY): Payer: Self-pay

## 2020-10-01 ENCOUNTER — Other Ambulatory Visit: Payer: Self-pay

## 2020-10-01 DIAGNOSIS — Z1231 Encounter for screening mammogram for malignant neoplasm of breast: Secondary | ICD-10-CM

## 2020-10-03 ENCOUNTER — Ambulatory Visit (HOSPITAL_COMMUNITY)
Admission: RE | Admit: 2020-10-03 | Discharge: 2020-10-03 | Disposition: A | Discharge status: Home / Self Care | Payer: Medicare HMO | Attending: Internal Medicine | Admitting: Internal Medicine

## 2020-10-03 ENCOUNTER — Ambulatory Visit (HOSPITAL_COMMUNITY): Payer: Medicare HMO | Admitting: Certified Registered"

## 2020-10-03 ENCOUNTER — Encounter (HOSPITAL_COMMUNITY)
Admission: RE | Disposition: A | Discharge status: Home / Self Care | Payer: Self-pay | Source: Home / Self Care | Attending: Internal Medicine

## 2020-10-03 ENCOUNTER — Encounter (HOSPITAL_COMMUNITY): Payer: Self-pay

## 2020-10-03 DIAGNOSIS — Z79899 Other long term (current) drug therapy: Secondary | ICD-10-CM | POA: Insufficient documentation

## 2020-10-03 DIAGNOSIS — R1013 Epigastric pain: Secondary | ICD-10-CM | POA: Diagnosis present

## 2020-10-03 DIAGNOSIS — K2970 Gastritis, unspecified, without bleeding: Secondary | ICD-10-CM | POA: Diagnosis not present

## 2020-10-03 DIAGNOSIS — K219 Gastro-esophageal reflux disease without esophagitis: Secondary | ICD-10-CM | POA: Insufficient documentation

## 2020-10-03 DIAGNOSIS — I10 Essential (primary) hypertension: Secondary | ICD-10-CM | POA: Insufficient documentation

## 2020-10-03 DIAGNOSIS — F17210 Nicotine dependence, cigarettes, uncomplicated: Secondary | ICD-10-CM | POA: Insufficient documentation

## 2020-10-03 DIAGNOSIS — Z8249 Family history of ischemic heart disease and other diseases of the circulatory system: Secondary | ICD-10-CM | POA: Diagnosis not present

## 2020-10-03 DIAGNOSIS — Z8719 Personal history of other diseases of the digestive system: Secondary | ICD-10-CM | POA: Insufficient documentation

## 2020-10-03 DIAGNOSIS — Z888 Allergy status to other drugs, medicaments and biological substances status: Secondary | ICD-10-CM | POA: Diagnosis not present

## 2020-10-03 DIAGNOSIS — Z886 Allergy status to analgesic agent status: Secondary | ICD-10-CM | POA: Insufficient documentation

## 2020-10-03 DIAGNOSIS — R1310 Dysphagia, unspecified: Secondary | ICD-10-CM | POA: Diagnosis not present

## 2020-10-03 DIAGNOSIS — R6881 Early satiety: Secondary | ICD-10-CM | POA: Diagnosis not present

## 2020-10-03 DIAGNOSIS — Z91040 Latex allergy status: Secondary | ICD-10-CM | POA: Diagnosis not present

## 2020-10-03 DIAGNOSIS — Z791 Long term (current) use of non-steroidal anti-inflammatories (NSAID): Secondary | ICD-10-CM | POA: Insufficient documentation

## 2020-10-03 DIAGNOSIS — R140 Abdominal distension (gaseous): Secondary | ICD-10-CM | POA: Insufficient documentation

## 2020-10-03 DIAGNOSIS — Z9851 Tubal ligation status: Secondary | ICD-10-CM | POA: Insufficient documentation

## 2020-10-03 DIAGNOSIS — K319 Disease of stomach and duodenum, unspecified: Secondary | ICD-10-CM | POA: Insufficient documentation

## 2020-10-03 HISTORY — PX: ESOPHAGOGASTRODUODENOSCOPY (EGD) WITH PROPOFOL: SHX5813

## 2020-10-03 HISTORY — PX: BALLOON DILATION: SHX5330

## 2020-10-03 HISTORY — PX: BIOPSY: SHX5522

## 2020-10-03 SURGERY — ESOPHAGOGASTRODUODENOSCOPY (EGD) WITH PROPOFOL
Anesthesia: General

## 2020-10-03 MED ORDER — STERILE WATER FOR IRRIGATION IR SOLN
Status: DC | PRN
  Administered 2020-10-03: 100 mL

## 2020-10-03 MED ORDER — KETAMINE HCL 50 MG/5ML IJ SOSY
PREFILLED_SYRINGE | INTRAMUSCULAR | Status: AC
  Filled 2020-10-03: qty 5

## 2020-10-03 MED ORDER — KETAMINE HCL 10 MG/ML IJ SOLN
INTRAMUSCULAR | Status: DC | PRN
  Administered 2020-10-03 (×2): 20 mg via INTRAVENOUS

## 2020-10-03 MED ORDER — PROPOFOL 500 MG/50ML IV EMUL
INTRAVENOUS | Status: DC | PRN
  Administered 2020-10-03: 150 ug/kg/min via INTRAVENOUS

## 2020-10-03 MED ORDER — LIDOCAINE HCL (CARDIAC) PF 100 MG/5ML IV SOSY
PREFILLED_SYRINGE | INTRAVENOUS | Status: DC | PRN
  Administered 2020-10-03: 50 mg via INTRAVENOUS

## 2020-10-03 MED ORDER — LACTATED RINGERS IV SOLN: INTRAVENOUS | Status: DC | PRN

## 2020-10-03 MED ORDER — OMEPRAZOLE 20 MG PO CPDR: 20.0000 mg | DELAYED_RELEASE_CAPSULE | Freq: Two times a day (BID) | ORAL | 5 refills | Status: DC

## 2020-10-03 MED ORDER — PROPOFOL 10 MG/ML IV BOLUS
INTRAVENOUS | Status: DC | PRN
  Administered 2020-10-03: 100 mg via INTRAVENOUS

## 2020-10-03 NOTE — Interval H&P Note (Signed)
History and Physical Interval Note:  10/03/2020 7:43 AM  Autumn [name]  has presented today for surgery, with the diagnosis of dysphagia, bloating, early satiety.  The various methods of treatment have been discussed with the patient and family. After consideration of risks, benefits and other options for treatment, the patient has consented to  Procedure(s) with comments: ESOPHAGOGASTRODUODENOSCOPY (EGD) WITH PROPOFOL (N/A) - 7:30am. ASA 3 BALLOON DILATION (N/A) as a surgical intervention.  The patient's history has been reviewed, patient examined, no change in status, stable for surgery.  I have reviewed the patient's chart and labs.  Questions were answered to the patient's satisfaction.     [name]

## 2020-10-03 NOTE — Transfer of Care (Signed)
Immediate Anesthesia Transfer of Care Note  Patient: Autumn [name]  Procedure(s) Performed: ESOPHAGOGASTRODUODENOSCOPY (EGD) WITH PROPOFOL BALLOON DILATION BIOPSY  Patient Location: PACU  Anesthesia Type:General  Level of Consciousness: drowsy  Airway & Oxygen Therapy: Patient Spontanous Breathing and Patient connected to nasal cannula oxygen  Post-op Assessment: Report given to RN and Post -op Vital signs reviewed and stable  Post vital signs: Reviewed and stable  Last Vitals:  Vitals Value Taken Time  BP    Temp    Pulse    Resp    SpO2      Last Pain:  Vitals:   10/03/20 0746  TempSrc:   PainSc: 6       Patients Stated Pain Goal: 8 ([id number])  Complications: No notable events documented.

## 2020-10-03 NOTE — Anesthesia Preprocedure Evaluation (Signed)
Anesthesia Evaluation  Patient identified by MRN, date of birth, ID band Patient awake    Reviewed: Allergy & Precautions, H&P , NPO status , Patient's Chart, lab work & pertinent test results, reviewed documented beta blocker date and time   Airway Mallampati: II  TM Distance: >3 FB Neck ROM: full    Dental no notable dental hx.    Pulmonary shortness of breath, Current Smoker and Patient abstained from smoking.,    Pulmonary exam normal breath sounds clear to auscultation       Cardiovascular Exercise Tolerance: Good hypertension, negative cardio ROS   Rhythm:regular Rate:Normal     Neuro/Psych PSYCHIATRIC DISORDERS Anxiety negative neurological ROS     GI/Hepatic Neg liver ROS, GERD  Medicated,  Endo/Other  negative endocrine ROS  Renal/GU negative Renal ROS  negative genitourinary   Musculoskeletal   Abdominal   Peds  Hematology  (+) Blood dyscrasia, anemia ,   Anesthesia Other Findings   Reproductive/Obstetrics negative OB ROS                             Anesthesia Physical Anesthesia Plan  ASA: 2  Anesthesia Plan: General   Post-op Pain Management:    Induction:   PONV Risk Score and Plan: Propofol infusion  Airway Management Planned:   Additional Equipment:   Intra-op Plan:   Post-operative Plan:   Informed Consent: I have reviewed the patients History and Physical, chart, labs and discussed the procedure including the risks, benefits and alternatives for the proposed anesthesia with the patient or authorized representative who has indicated his/her understanding and acceptance.     Dental Advisory Given  Plan Discussed with: CRNA  Anesthesia Plan Comments:         Anesthesia Quick Evaluation

## 2020-10-03 NOTE — Anesthesia Postprocedure Evaluation (Signed)
Anesthesia Post Note  Patient: Autumn [name]  Procedure(s) Performed: ESOPHAGOGASTRODUODENOSCOPY (EGD) WITH PROPOFOL BALLOON DILATION BIOPSY  Patient location during evaluation: Phase II Anesthesia Type: General Level of consciousness: awake Pain management: pain level controlled Vital Signs Assessment: post-procedure vital signs reviewed and stable Respiratory status: spontaneous breathing and respiratory function stable Cardiovascular status: blood pressure returned to baseline and stable Postop Assessment: no headache and no apparent nausea or vomiting Anesthetic complications: no Comments: Late entry   No notable events documented.   Last Vitals:  Vitals:   10/03/20 0800 10/03/20 0813  BP: (!) 156/76 (!) 170/88  Pulse: (!) 102   Resp: 14 20  Temp: (!) 36.1 C   SpO2: 99% 100%    Last Pain:  Vitals:   10/03/20 0813  TempSrc:   PainSc: 0-No pain                 Louann Sjogren

## 2020-10-03 NOTE — Anesthesia Procedure Notes (Signed)
Date/Time: 10/03/2020 7:50 AM Performed by: Orlie Dakin, CRNA Pre-anesthesia Checklist: Patient identified, Emergency Drugs available, Suction available and Patient being monitored Patient Re-evaluated:Patient Re-evaluated prior to induction Oxygen Delivery Method: Nasal cannula Induction Type: IV induction Placement Confirmation: positive ETCO2

## 2020-10-03 NOTE — Discharge Instructions (Addendum)
EGD Discharge instructions Please read the instructions outlined below and refer to this sheet in the next few weeks. These discharge instructions provide you with general information on caring for yourself after you leave the hospital. Your doctor may also give you specific instructions. While your treatment has been planned according to the most current medical practices available, unavoidable complications occasionally occur. If you have any problems or questions after discharge, please call your doctor. ACTIVITY You may resume your regular activity but move at a slower pace for the next 24 hours.  Take frequent rest periods for the next 24 hours.  Walking will help expel (get rid of) the air and reduce the bloated feeling in your abdomen.  No driving for 24 hours (because of the anesthesia (medicine) used during the test).  You may shower.  Do not sign any important legal documents or operate any machinery for 24 hours (because of the anesthesia used during the test).  NUTRITION Drink plenty of fluids.  You may resume your normal diet.  Begin with a light meal and progress to your normal diet.  Avoid alcoholic beverages for 24 hours or as instructed by your caregiver.  MEDICATIONS You may resume your normal medications unless your caregiver tells you otherwise.  WHAT YOU CAN EXPECT TODAY You may experience abdominal discomfort such as a feeling of fullness or "gas" pains.  FOLLOW-UP Your doctor will discuss the results of your test with you.  SEEK IMMEDIATE MEDICAL ATTENTION IF ANY OF THE FOLLOWING OCCUR: Excessive nausea (feeling sick to your stomach) and/or vomiting.  Severe abdominal pain and distention (swelling).  Trouble swallowing.  Temperature over 101 F (37.8 C).  Rectal bleeding or vomiting of blood.    Your EGD revealed a moderate amount of inflammation in your stomach.  I took biopsies of this to rule out infection with a bacteria called H. pylori.  I am going to  increase your omeprazole to twice daily.  This medication works best if you take it 30 minutes before breakfast and 30 minutes for dinner.  Try your best to avoid NSAIDs.  Await pathology results, my office will contact you.  Otherwise follow-up with GI in 4 months.  I hope you have a great rest of your week!  Elon Alas. Abbey Chatters, D.O. Gastroenterology and Hepatology [facility]

## 2020-10-03 NOTE — Op Note (Signed)
[facility] Patient Name: Autumn [name] Procedure Date: 10/03/2020 6:56 AM MRN: [id number] Date of Birth: 1965/09/13 Attending MD: Elon Alas. Edgar Frisk CSN: [id number] Age: 55 Admit Type: Outpatient Procedure:                Upper GI endoscopy Indications:              Epigastric abdominal pain, Abdominal bloating Providers:                Elon Alas. Abbey Chatters, DO, Caprice Kluver, Aram Candela Referring MD:              Medicines:                See the Anesthesia note for documentation of the                            administered medications Complications:            No immediate complications. Estimated Blood Loss:     Estimated blood loss was minimal. Procedure:                Pre-Anesthesia Assessment:                           - The anesthesia plan was to use monitored                            anesthesia care (MAC).                           After obtaining informed consent, the endoscope was                            passed under direct vision. Throughout the                            procedure, the patient's blood pressure, pulse, and                            oxygen saturations were monitored continuously. The                            [id number]-H190 ([id number]) scope was introduced through the                            mouth, and advanced to the second part of duodenum.                            The upper GI endoscopy was accomplished without                            difficulty. The patient tolerated the procedure                            well. Scope In: 7:51:18 AM Scope Out: 7:55:07 AM Total Procedure Duration: 0 hours 3 minutes 49 seconds  Findings:      The Z-line was regular and was found  36 cm from the incisors.      There is no endoscopic evidence of bleeding, areas of erosion,       esophagitis, hiatal hernia, stenosis, stricture, ulcerations or varices       in the entire esophagus.      Localized moderate inflammation characterized by erythema and  linear       erosions was found in the gastric antrum. Biopsies were taken with a       cold forceps for Helicobacter pylori testing.      The duodenal bulb, first portion of the duodenum and second portion of       the duodenum were normal. Impression:               - Z-line regular, 36 cm from the incisors.                           - Gastritis. Biopsied.                           - Normal duodenal bulb, first portion of the                            duodenum and second portion of the duodenum. Moderate Sedation:      Per Anesthesia Care Recommendation:           - Patient has a contact number available for                            emergencies. The signs and symptoms of potential                            delayed complications were discussed with the                            patient. Return to normal activities tomorrow.                            Written discharge instructions were provided to the                            patient.                           - Resume previous diet.                           - Continue present medications.                           - Await pathology results.                           - Use Prilosec (omeprazole) 20 mg PO BID.                           - Try to avoid ibuprofen, naproxen, or other  non-steroidal anti-inflammatory drugs.                           - Return to GI clinic in 4 months. Procedure Code(s):        --- Professional ---                           [id number], Esophagogastroduodenoscopy, flexible,                            transoral; with biopsy, single or multiple Diagnosis Code(s):        --- Professional ---                           K29.70, Gastritis, unspecified, without bleeding                           R10.13, Epigastric pain                           R14.0, Abdominal distension (gaseous) CPT copyright 2019 American Medical Association. All rights reserved. The codes documented in this report are  preliminary and upon coder review may  be revised to meet current compliance requirements. Elon Alas. Abbey Chatters, DO [place], DO 10/03/2020 8:01:16 AM This report has been signed electronically. Number of Addenda: 0

## 2020-10-04 LAB — SURGICAL PATHOLOGY

## 2020-10-10 ENCOUNTER — Encounter (HOSPITAL_COMMUNITY): Payer: Self-pay | Admitting: Internal Medicine

## 2020-10-10 ENCOUNTER — Telehealth: Payer: Self-pay | Admitting: Internal Medicine

## 2020-10-10 NOTE — Telephone Encounter (Signed)
Returned the pt's call and was advised by her that she has a irritated to sore throat for past 3 days and it feels like something is stuck in her throat. I advised her is more irritated and she said yes. She has been using OTC throat spray. The pt also wanted her results from her EGD that was done on the 8th. I asked did she have anymore symptoms besides that she stated no. Pt thinks it came from the stretching of the throat and I advised her that it would not have started to bother her after 5 days. That she may need to see her PCP because she had some cold symptoms going on too

## 2020-10-10 NOTE — Telephone Encounter (Signed)
Phoned and advised the pt of her result note and recommendations of medication, avoid NSAIDs, follow-up as previously scheduled. Also advised of sore throat recommendations. The pt expressed understanding.

## 2020-10-10 NOTE — Telephone Encounter (Signed)
Pt had EGD by Dr Abbey Chatters on 9/8/222. She was asking about her results and also said her throat was still sore. Please advise. [phone]

## 2020-10-10 NOTE — Telephone Encounter (Signed)
Please let patient know with phone call or letter that the biopsies showed inflammation in the stomach, negative for H. pylori.  Continue on twice daily PPI.  Avoid NSAIDs.  Follow-up with GI as previously scheduled.   Her sore throat is likely not due to recent endoscopy.  Recommend that she discuss possible infection with her PCP, may need COVID testing.  Thank you

## 2020-11-05 ENCOUNTER — Telehealth: Payer: Self-pay | Admitting: Internal Medicine

## 2020-11-05 NOTE — Telephone Encounter (Signed)
I called the pt and advised that as soon as you read her report I would call her.

## 2020-11-05 NOTE — Telephone Encounter (Signed)
Pt had procedure on 10/03/2020 and was calling to see if her results were available. [phone]

## 2020-11-06 NOTE — Telephone Encounter (Signed)
Pt returned the call advised of the instructions and what to avoid. Pt expressed understanding

## 2020-11-06 NOTE — Telephone Encounter (Signed)
Please let patient know with phone call or letter that the biopsies showed inflammation in the stomach, negative for H. pylori.  Continue on twice daily PPI.  Avoid NSAIDs.  Follow-up with GI as previously scheduled.

## 2020-11-06 NOTE — Telephone Encounter (Signed)
Phoned and LMOVM for the pt to return call 

## 2020-11-12 ENCOUNTER — Other Ambulatory Visit (HOSPITAL_COMMUNITY): Payer: Self-pay | Admitting: Otolaryngology

## 2020-11-12 ENCOUNTER — Other Ambulatory Visit: Payer: Self-pay | Admitting: Otolaryngology

## 2020-11-12 DIAGNOSIS — E079 Disorder of thyroid, unspecified: Secondary | ICD-10-CM

## 2020-11-21 ENCOUNTER — Encounter (HOSPITAL_COMMUNITY): Payer: Self-pay

## 2020-11-21 ENCOUNTER — Ambulatory Visit (HOSPITAL_COMMUNITY)
Admission: RE | Admit: 2020-11-21 | Discharge: 2020-11-21 | Disposition: A | Discharge status: Home / Self Care | Payer: Medicare HMO | Source: Ambulatory Visit | Attending: Family Medicine | Admitting: Family Medicine

## 2020-11-21 ENCOUNTER — Other Ambulatory Visit: Payer: Self-pay

## 2020-11-21 DIAGNOSIS — Z1231 Encounter for screening mammogram for malignant neoplasm of breast: Secondary | ICD-10-CM | POA: Diagnosis present

## 2020-11-22 ENCOUNTER — Ambulatory Visit (HOSPITAL_COMMUNITY)
Admission: RE | Admit: 2020-11-22 | Discharge: 2020-11-22 | Disposition: A | Discharge status: Home / Self Care | Payer: Medicare HMO | Source: Ambulatory Visit | Attending: Otolaryngology | Admitting: Otolaryngology

## 2020-11-22 DIAGNOSIS — E079 Disorder of thyroid, unspecified: Secondary | ICD-10-CM | POA: Diagnosis not present

## 2020-11-27 NOTE — Progress Notes (Signed)
Referring Provider: Leslie Andrea, MD Primary Care Physician:  Leslie Andrea, MD Primary GI Physician: Dr. Abbey Chatters  Chief Complaint  Patient presents with   Sore Throat     HPI:   Autumn [name] is a 55 y.o. female with history of constipation, GERD, rectal bleeding due to internal hemorrhoids.  Colonoscopy up-to-date July 2020 with 3 hyperplastic polyps, external/internal hemorrhoids, tortuous left colon, due for repeat in 2030.  She is presenting today for follow-up, chief complaint of sore throat.   Last seen in our office 09/03/2020 for pill dysphagia, bloating, early satiety.  Chronic constipation well controlled with Linzess 290 mcg daily.  Felt bloated in the upper abdomen without postprandial symptoms, nausea with meds in the morning, early satiety x4 months.  No weight loss.  Also with pill dysphagia.  Admitted to naproxen twice daily.  Plan for EGD with possible dilation and small bowel biopsies, continue Linzess, continue omeprazole daily.  CT neck 09/17/2020: Soft tissue density in the vallecula most likely reflect prominent lingual tonsils, prominent palatine tonsils which moderately narrows the oropharyngeal airway.  EGD 10/03/2020: Normal esophagus, Gastritis biopsied (nonspecific reactive gastropathy, negative for H. pylori), normal examined duodenum.  Recommended Prilosec 20 mg twice daily.  Thyroid ultrasound 11/22/2020: 1.4 cm left mid thyroid TR 4 nodule meets criteria for follow-up in 1 year.  No biopsy indicated.  Additional 2 nodules also noted.  Right neck palpable abnormality appears to correlate with mildly enlarged right submandibular lymph node favorable to be reactive.  Today:  Swelling on this right side of her neck, sore. Has cough and some sinus drainage. Nose stays stopped up a lot. Feels horse in the morning. Some soreness in her throat when swallowing on the right. Symptoms started about 2 weeks.  No fever or body aches.  Some shortness of  breath with walking.  GERD is well controlled with omeprazole 20 mg BID.  No esophageal dysphagia symptoms.  No nausea or vomiting. No routine abdominal pain. No unintentional weight loss. Bowels moving well with Linzess 290 mcg daily. No brbpr or melena.   Notices some bloating in the morning when she wakes up, goes away during the day.  Eats within 3 hours of going to bed. Maybe 2 hours before. Dinner is her main meal.    Past Medical History:  Diagnosis Date   Anxiety    Panic attacks- "cries"   Arthritis    Complication of anesthesia    had some hallucations   Fibroids, submucosal 12/02/2016   GERD (gastroesophageal reflux disease)    H/O colonoscopy    Hypertension    Rectal bleeding    in setting of hemorrhoids. Colonoscopy in July 2020   Shortness of breath dyspnea    with exertion   Thickened endometrium 12/02/2016    Past Surgical History:  Procedure Laterality Date   BALLOON DILATION N/A 10/03/2020   Procedure: BALLOON DILATION;  Surgeon: [name], DO;  [facility];  Service: Endoscopy;  Laterality: N/A;   BIOPSY  10/03/2020   Procedure: BIOPSY;  Surgeon: [name], DO;  [facility];  Service: Endoscopy;;   BREAST LUMPECTOMY Right    age 46's-benign   CESAREAN SECTION     x3   COLONOSCOPY     COLONOSCOPY WITH PROPOFOL N/A 08/23/2018   PROPOFOL;  Surgeon: Danie Binder, MD; three 2-4 mm polyps in the rectum and sigmoid colon, external and internal hemorrhoids, tortuous left colon.  Pathology with 3 hyperplastic  polyps.  Next colonoscopy in 2030.   DENTAL SURGERY     ESOPHAGOGASTRODUODENOSCOPY (EGD) WITH PROPOFOL N/A 10/03/2020   Surgeon: [name], DO; Normal esophagus, Gastritis biopsied (nonspecific reactive gastropathy, negative for H. pylori), normal examined duodenum.   MASS EXCISION N/A 12/06/2017   Procedure: EXCISION 1CM CYST ON FACE;  Surgeon: Virl Cagey, MD;  Location: [facility];  Service: General;   Laterality: N/A;   MULTIPLE EXTRACTIONS WITH ALVEOLOPLASTY Bilateral 04/12/2015   Procedure: BILATERAL MULTIPLE EXTRACTIONS NUMBERS TWENTY, TWENTY ONE, TWENTY TWO, TWENTY THREE, TWENTY FOUR, TWENTY FIVE, TWENTY EIGHT, TWENTY NINE WITH ALVEOLOPLASTY;  Surgeon: Diona Browner, DDS;  Location: [place];  Service: Oral Surgery;  Laterality: Bilateral;   POLYPECTOMY  08/23/2018   Procedure: POLYPECTOMY;  Surgeon: Danie Binder, MD;  [facility];  Service: Endoscopy;;   TUBAL LIGATION      Current Outpatient Medications  Medication Sig Dispense Refill   Cholecalciferol (VITAMIN D-3) 125 MCG (5000 UT) TABS Take 5,000 Units by mouth daily.     labetalol (NORMODYNE) 200 MG tablet Take 200 mg by mouth 2 (two) times daily.     LINZESS 290 MCG CAPS capsule TAKE 1 CAPSULE (290 MCG TOTAL) BY MOUTH DAILY BEFORE BREAKFAST. 90 capsule 3   meloxicam (MOBIC) 15 MG tablet Take 15 mg by mouth daily.     naproxen (NAPROSYN) 500 MG tablet Take 500 mg by mouth 2 (two) times a day.     omeprazole (PRILOSEC) 20 MG capsule Take 1 capsule (20 mg total) by mouth 2 (two) times daily before a meal. 60 capsule 5   pravastatin (PRAVACHOL) 40 MG tablet Take 40 mg by mouth daily.     traZODone (DESYREL) 50 MG tablet Take 50 mg by mouth at bedtime.     No current facility-administered medications for this visit.    Allergies as of 11/28/2020 - Review Complete 11/28/2020  Allergen Reaction Noted   Bee venom Anaphylaxis 11/04/2010   Tramadol Hives 11/04/2010   Asa [aspirin] Other (See Comments) 08/14/2015   Latex Itching 12/26/2018   Peppermint flavor Other (See Comments) 03/01/2018    Family History  Adopted: Yes  Problem Relation Age of Onset   Heart disease Other    Colon cancer Neg Hx     Social History   Socioeconomic History   Marital status: Significant Other    Spouse name: Not on file   Number of children: Not on file   Years of education: Not on file   Highest education level: Not on file   Occupational History   Not on file  Tobacco Use   Smoking status: Every Day    Packs/day: 1.00    Years: 31.00    Pack years: 31.00    Types: Cigarettes   Smokeless tobacco: Never  Vaping Use   Vaping Use: Never used  Substance and Sexual Activity   Alcohol use: Yes    Comment: occ   Drug use: No   Sexual activity: Yes    Birth control/protection: Surgical    Comment: tubal  Other Topics Concern   Not on file  Social History Narrative   Not on file   Social Determinants of Health   Financial Resource Strain: Not on file  Food Insecurity: Not on file  Transportation Needs: Not on file  Physical Activity: Not on file  Stress: Not on file  Social Connections: Not on file    Review of Systems: Gen: Denies fever, chills, presyncope, syncope. CV:  Denies chest pain, palpitations. Resp: See HPI GI: See HPI Heme: See HPI  Physical Exam: BP (!) 153/78   Pulse 77   Temp 97.7 F (36.5 C) (Temporal)   Ht 5\' 6"  (1.676 m)   Wt 283 lb 12.8 oz (128.7 kg)   LMP 11/11/2016   BMI 45.81 kg/m  General:   Alert and oriented. No distress noted. Pleasant and cooperative.  Head:  Normocephalic and atraumatic. Eyes:  Conjuctiva clear without scleral icterus. Mouth:  Oral mucosa pink and moist.  Unable to visualize posterior pharynx due to tongue. Neck: Right submandibular area appears slightly swollen with lymph node that is tender to palpation. Heart:  S1, S2 present without murmurs appreciated. Lungs:  Clear to auscultation bilaterally. No wheezes, rales, or rhonchi. No distress.  Abdomen:  +BS, soft, non-tender and non-distended. No rebound or guarding. No HSM or masses noted. Msk:  Symmetrical without gross deformities. Normal posture. Extremities:  Without edema. Neurologic:  Alert and  oriented x4 Psych:  Normal mood and affect.    Assessment: 55 year old female with history of constipation, GERD, rectal bleeding due to hemorrhoids with colonoscopy up-to-date in 2020,  surveillance due in 2030 presenting today with chief complaint of sore throat.  Also discussed GERD, constipation, bloating.  Sore throat: Present for 2 weeks, associated swelling in the right submandibular area, cough, nasal congestion, hoarseness x2 weeks.  GERD remains well controlled on PPI twice daily.  No esophageal dysphagia symptoms.  Recent EGD September 2022 with normal-appearing esophagus.  Recent thyroid ultrasound 10/28 with thyroid nodules and right palpable neck abnormality appears to correlate with mildly enlarged right submandibular lymph node favorable to be reactive.  On exam, the right submandibular area does appear slightly swollen and lymph node is tender to palpation.  Unable to visualize posterior pharynx.  Recommended follow-up with PCP for further evaluation as well as Dr. Benjamine Mola as I suspect her symptoms are secondary to upper respiratory illness versus viral/bacterial pharyngitis.  GERD: Well-controlled on omeprazole 20 mg twice daily.  No alarm symptoms.  Continue current regimen.  Constipation: Well-controlled on Linzess 290 mcg daily.  No alarm symptoms.  Continue current regimen.  Bloating:  Only present in the morning.  Likely secondary to eating main meal of the day in the evening within 3 hours of going to bed.  No other significant upper GI symptoms.  No alarm symptoms.  Advised eating 4-6 small meals daily, low-fat diet, not eating within 3 hours of going to bed.   Plan: Follow-up with PCP and/or Dr. Arnette Norris on sore throat.  Continue omeprazole 20 mg twice daily. Continue Linzess 290 mcg daily. 4-6 small meals daily. Low-fat diet. No eating within 3 hours of going to bed. Follow-up in 6 months or sooner if needed.   Aliene Altes, PA-C [facility] 11/28/2020

## 2020-11-28 ENCOUNTER — Ambulatory Visit (INDEPENDENT_AMBULATORY_CARE_PROVIDER_SITE_OTHER): Payer: Medicare HMO | Admitting: Gastroenterology

## 2020-11-28 ENCOUNTER — Other Ambulatory Visit: Payer: Self-pay

## 2020-11-28 ENCOUNTER — Encounter: Payer: Self-pay | Admitting: Gastroenterology

## 2020-11-28 ENCOUNTER — Encounter: Payer: Self-pay | Admitting: Internal Medicine

## 2020-11-28 VITALS — BP 153/78 | HR 77 | Temp 97.7°F | Ht 66.0 in | Wt 283.8 lb

## 2020-11-28 DIAGNOSIS — R140 Abdominal distension (gaseous): Secondary | ICD-10-CM

## 2020-11-28 DIAGNOSIS — K5900 Constipation, unspecified: Secondary | ICD-10-CM | POA: Diagnosis not present

## 2020-11-28 DIAGNOSIS — K219 Gastro-esophageal reflux disease without esophagitis: Secondary | ICD-10-CM

## 2020-11-28 DIAGNOSIS — J029 Acute pharyngitis, unspecified: Secondary | ICD-10-CM

## 2020-11-28 NOTE — Patient Instructions (Signed)
Call your primary care doctor to discuss your sore throat. You can also call Dr. Melene Plan to follow-up on this.   I suspect your upper abdominal bloating in the morning is secondary to eating your main meal in the evening within 3 hours of going to bed.  Try eating 4-6 small meals daily.  Do not eat within 3 hours of going to bed.  Avoid fried, fatty, greasy foods.  For reflux: Continue omeprazole 20 mg twice daily as you are doing well on this.  For constipation: Continue Linzess 290 mcg daily as this is working well for you.   It was great seeing you again today!  We will plan to see you back in about 6 months.  Do not hesitate to call if you have any questions or concerns prior to your next visit.   Aliene Altes, PA-C [facility]

## 2021-01-03 ENCOUNTER — Encounter (HOSPITAL_COMMUNITY): Payer: Self-pay | Admitting: Emergency Medicine

## 2021-01-03 ENCOUNTER — Emergency Department (HOSPITAL_COMMUNITY)
Admission: EM | Admit: 2021-01-03 | Discharge: 2021-01-03 | Disposition: A | Discharge status: Home / Self Care | Payer: Medicare HMO | Attending: Emergency Medicine | Admitting: Emergency Medicine

## 2021-01-03 ENCOUNTER — Other Ambulatory Visit: Payer: Self-pay

## 2021-01-03 DIAGNOSIS — M7918 Myalgia, other site: Secondary | ICD-10-CM | POA: Insufficient documentation

## 2021-01-03 DIAGNOSIS — I10 Essential (primary) hypertension: Secondary | ICD-10-CM | POA: Insufficient documentation

## 2021-01-03 DIAGNOSIS — F17210 Nicotine dependence, cigarettes, uncomplicated: Secondary | ICD-10-CM | POA: Insufficient documentation

## 2021-01-03 MED ORDER — SULFAMETHOXAZOLE-TRIMETHOPRIM 800-160 MG PO TABS: 1.0000 | ORAL_TABLET | Freq: Two times a day (BID) | ORAL | 0 refills | Status: AC

## 2021-01-03 MED ORDER — SULFAMETHOXAZOLE-TRIMETHOPRIM 800-160 MG PO TABS
1.0000 | ORAL_TABLET | Freq: Once | ORAL | Status: AC
  Administered 2021-01-03: 1 via ORAL
  Filled 2021-01-03: qty 1

## 2021-01-03 MED ORDER — OXYCODONE-ACETAMINOPHEN 5-325 MG PO TABS: 1.0000 | ORAL_TABLET | Freq: Four times a day (QID) | ORAL | 0 refills | Status: DC | PRN

## 2021-01-03 MED ORDER — HYDROCODONE-ACETAMINOPHEN 5-325 MG PO TABS
1.0000 | ORAL_TABLET | Freq: Once | ORAL | Status: AC
Start: 2021-01-03 — End: 2021-01-03
  Administered 2021-01-03: 1 via ORAL
  Filled 2021-01-03: qty 1

## 2021-01-03 NOTE — ED Triage Notes (Signed)
Pt arrive POV from home c/o left buttock pain that started 4 days ago with a bump 1" in diameter. 2 days ago pt noticed bump was spreading. Tonight left buttocks started bleeding and painful. Blood noticed with clots upon assessment.   Pt took sulfamethoxazole(medication pt had at home for boils) at 930pm

## 2021-01-03 NOTE — Discharge Instructions (Signed)
You were evaluated in the Emergency Department and after careful evaluation, we did not find any emergent condition requiring admission or further testing in the hospital.  Your exam/testing today was overall reassuring.  Your symptoms are likely due to an abscess of your gluteal cleft.  We offered incision and drainage this evening but you preferred a trial of antibiotics.  Please follow-up closely with your regular doctor and take the antibiotics as directed.  Recommend Tylenol 1000 mg every 4-6 hours and/or Motrin 600 mg every 4-6 hours for pain.  You can use the oxycodone medication for more significant pain.  Please return to the Emergency Department if you experience any worsening of your condition.  Thank you for allowing [place] to be a part of your care.

## 2021-01-03 NOTE — ED Provider Notes (Signed)
[facility] Emergency Department Provider Note MRN:  [id number]  Arrival date & time: 01/03/21     Chief Complaint   Possible Cyst   History of Present Illness   Autumn [name] is a 55 y.o. year-old female with a history of hypertension presenting to the ED with chief complaint of possible cyst.  Patient is having pain to the left gluteal cleft for the past 2 or 3 days, seems to be getting worse.  Unsure if she has a cyst or a boil.  Denies fever, no other complaints.  Symptoms are mild to moderate, constant, worse with sitting.  Review of Systems  A problem-focused ROS was performed. Positive for gluteal cleft pain.  Patient denies fever.  Patient's Health History    Past Medical History:  Diagnosis Date   Anxiety    Panic attacks- "cries"   Arthritis    Complication of anesthesia    had some hallucations   Fibroids, submucosal 12/02/2016   GERD (gastroesophageal reflux disease)    H/O colonoscopy    Hypertension    Rectal bleeding    in setting of hemorrhoids. Colonoscopy in July 2020   Shortness of breath dyspnea    with exertion   Thickened endometrium 12/02/2016    Past Surgical History:  Procedure Laterality Date   BALLOON DILATION N/A 10/03/2020   Procedure: BALLOON DILATION;  Surgeon: [name], DO;  [facility];  Service: Endoscopy;  Laterality: N/A;   BIOPSY  10/03/2020   Procedure: BIOPSY;  Surgeon: [name], DO;  [facility];  Service: Endoscopy;;   BREAST LUMPECTOMY Right    age 83's-benign   CESAREAN SECTION     x3   COLONOSCOPY     COLONOSCOPY WITH PROPOFOL N/A 08/23/2018   PROPOFOL;  Surgeon: Danie Binder, MD; three 2-4 mm polyps in the rectum and sigmoid colon, external and internal hemorrhoids, tortuous left colon.  Pathology with 3 hyperplastic polyps.  Next colonoscopy in 2030.   DENTAL SURGERY     ESOPHAGOGASTRODUODENOSCOPY (EGD) WITH PROPOFOL N/A 10/03/2020   Surgeon:  [name], DO; Normal esophagus, Gastritis biopsied (nonspecific reactive gastropathy, negative for H. pylori), normal examined duodenum.   MASS EXCISION N/A 12/06/2017   Procedure: EXCISION 1CM CYST ON FACE;  Surgeon: Virl Cagey, MD;  Location: [facility];  Service: General;  Laterality: N/A;   MULTIPLE EXTRACTIONS WITH ALVEOLOPLASTY Bilateral 04/12/2015   Procedure: BILATERAL MULTIPLE EXTRACTIONS NUMBERS TWENTY, TWENTY ONE, TWENTY TWO, TWENTY THREE, TWENTY FOUR, TWENTY FIVE, TWENTY EIGHT, TWENTY NINE WITH ALVEOLOPLASTY;  Surgeon: Diona Browner, DDS;  Location: [place];  Service: Oral Surgery;  Laterality: Bilateral;   POLYPECTOMY  08/23/2018   Procedure: POLYPECTOMY;  Surgeon: Danie Binder, MD;  [facility];  Service: Endoscopy;;   TUBAL LIGATION      Family History  Adopted: Yes  Problem Relation Age of Onset   Heart disease Other    Colon cancer Neg Hx     Social History   Socioeconomic History   Marital status: Significant Other    Spouse name: Not on file   Number of children: Not on file   Years of education: Not on file   Highest education level: Not on file  Occupational History   Not on file  Tobacco Use   Smoking status: Every Day    Packs/day: 1.00    Years: 31.00    Pack years: 31.00    Types: Cigarettes  Smokeless tobacco: Never  Vaping Use   Vaping Use: Never used  Substance and Sexual Activity   Alcohol use: Yes    Comment: occ   Drug use: No   Sexual activity: Yes    Birth control/protection: Surgical    Comment: tubal  Other Topics Concern   Not on file  Social History Narrative   Not on file   Social Determinants of Health   Financial Resource Strain: Not on file  Food Insecurity: Not on file  Transportation Needs: Not on file  Physical Activity: Not on file  Stress: Not on file  Social Connections: Not on file  Intimate Partner Violence: Not on file     Physical Exam   Vitals:   01/03/21 0347  BP: (!) 146/97   Pulse: 97  Resp: 20  Temp: 98.4 F (36.9 C)  SpO2: 98%    CONSTITUTIONAL: Well-appearing, NAD NEURO:  Alert and oriented x 3, no focal deficits EYES:  eyes equal and reactive ENT/NECK:  no LAD, no JVD CARDIO: Regular rate, well-perfused, normal S1 and S2 PULM:  CTAB no wheezing or rhonchi GI/GU:  normal bowel sounds, non-distended, non-tender; tender and fluctuant area to the left gluteal cleft MSK/SPINE:  No gross deformities, no edema SKIN:  no rash, atraumatic PSYCH:  Appropriate speech and behavior  *Additional and/or pertinent findings included in MDM below  Diagnostic and Interventional Summary    EKG Interpretation  Date/Time:    Ventricular Rate:    PR Interval:    QRS Duration:   QT Interval:    QTC Calculation:   R Axis:     Text Interpretation:         Labs Reviewed - No data to display  No orders to display    Medications  sulfamethoxazole-trimethoprim (BACTRIM DS) 800-160 MG per tablet 1 tablet (has no administration in time range)  HYDROcodone-acetaminophen (NORCO/VICODIN) 5-325 MG per tablet 1 tablet (has no administration in time range)     Procedures  /  Critical Care Procedures  ED Course and Medical Decision Making  I have reviewed the triage vital signs, the nursing notes, and pertinent available records from the EMR.  Listed above are laboratory and imaging tests that I personally ordered, reviewed, and interpreted and then considered in my medical decision making (see below for details).  Exam is suspicious for abscess of the gluteal cleft.  Recommended incision and drainage, however patient has seen this procedure done on her husband and she is fearful of this procedure.  She would prefer a trial of antibiotics.  Given that she has no fever or systemic signs of illness and the area does not appear to have any surrounding cellulitis or other complicating features, this is an appropriate option.  Especially given that she has close follow-up  with primary care doctor in the next couple days.  Appropriate for discharge.       Barth Kirks. Sedonia Small, [address] [email or web address]  Final Clinical Impressions(s) / ED Diagnoses     ICD-10-CM   1. Gluteal pain  M79.18       ED Discharge Orders          Ordered    sulfamethoxazole-trimethoprim (BACTRIM DS) 800-160 MG tablet  2 times daily        01/03/21 0412    oxyCODONE-acetaminophen (PERCOCET/ROXICET) 5-325 MG tablet  Every 6 hours PRN        01/03/21 0412  Discharge Instructions Discussed with and Provided to Patient:    Discharge Instructions      You were evaluated in the Emergency Department and after careful evaluation, we did not find any emergent condition requiring admission or further testing in the hospital.  Your exam/testing today was overall reassuring.  Your symptoms are likely due to an abscess of your gluteal cleft.  We offered incision and drainage this evening but you preferred a trial of antibiotics.  Please follow-up closely with your regular doctor and take the antibiotics as directed.  Recommend Tylenol 1000 mg every 4-6 hours and/or Motrin 600 mg every 4-6 hours for pain.  You can use the oxycodone medication for more significant pain.  Please return to the Emergency Department if you experience any worsening of your condition.  Thank you for allowing [place] to be a part of your care.        Maudie Flakes, MD 01/03/21 [phone]

## 2021-01-06 MED FILL — Oxycodone w/ Acetaminophen Tab 5-325 MG: ORAL | Qty: 6 | Status: AC

## 2021-01-07 ENCOUNTER — Other Ambulatory Visit: Payer: Self-pay

## 2021-01-07 ENCOUNTER — Other Ambulatory Visit: Payer: Self-pay | Admitting: Internal Medicine

## 2021-01-07 ENCOUNTER — Encounter: Payer: Self-pay | Admitting: General Surgery

## 2021-01-07 ENCOUNTER — Ambulatory Visit (INDEPENDENT_AMBULATORY_CARE_PROVIDER_SITE_OTHER): Payer: Medicare HMO | Admitting: General Surgery

## 2021-01-07 VITALS — BP 149/93 | HR 97 | Temp 98.7°F | Resp 16 | Ht 64.0 in | Wt 279.0 lb

## 2021-01-07 DIAGNOSIS — K219 Gastro-esophageal reflux disease without esophagitis: Secondary | ICD-10-CM

## 2021-01-07 DIAGNOSIS — L0231 Cutaneous abscess of buttock: Secondary | ICD-10-CM | POA: Diagnosis not present

## 2021-01-07 MED ORDER — AMOXICILLIN-POT CLAVULANATE 875-125 MG PO TABS: 1.0000 | ORAL_TABLET | Freq: Two times a day (BID) | ORAL | 0 refills | Status: AC

## 2021-01-07 MED ORDER — OXYCODONE HCL 5 MG PO TABS: 5.0000 mg | ORAL_TABLET | ORAL | 0 refills | Status: DC | PRN

## 2021-01-07 NOTE — Progress Notes (Signed)
[facility] History and Physical  Reason for Referral: Abscess left gluteal  Referring Physician: ED   Chief Complaint   Abscess     Autumn [name] is a 55 y.o. female.  HPI: Autumn [name] is known to me after having a cyst removed from her face. She has had gluteal swelling and pain and drainage for over a week. She was in the ED a few days ago and did not let them do an I&D because she was nervous. They prescribed antibiotics and sent her to see me. She reports pain in the area that is sharp and constant. This pain throbs and she is unable to sit. She has drainage and some bleeding.  She has never had anything like this before. She denies any fevers. She is here today with her significant other.   Past Medical History:  Diagnosis Date   Anxiety    Panic attacks- "cries"   Arthritis    Complication of anesthesia    had some hallucations   Fibroids, submucosal 12/02/2016   GERD (gastroesophageal reflux disease)    H/O colonoscopy    Hypertension    Rectal bleeding    in setting of hemorrhoids. Colonoscopy in July 2020   Shortness of breath dyspnea    with exertion   Thickened endometrium 12/02/2016    Past Surgical History:  Procedure Laterality Date   BALLOON DILATION N/A 10/03/2020   Procedure: BALLOON DILATION;  Surgeon: [name], DO;  [facility];  Service: Endoscopy;  Laterality: N/A;   BIOPSY  10/03/2020   Procedure: BIOPSY;  Surgeon: [name], DO;  [facility];  Service: Endoscopy;;   BREAST LUMPECTOMY Right    age 38's-benign   CESAREAN SECTION     x3   COLONOSCOPY     COLONOSCOPY WITH PROPOFOL N/A 08/23/2018   PROPOFOL;  Surgeon: Danie Binder, MD; three 2-4 mm polyps in the rectum and sigmoid colon, external and internal hemorrhoids, tortuous left colon.  Pathology with 3 hyperplastic polyps.  Next colonoscopy in 2030.   DENTAL SURGERY     ESOPHAGOGASTRODUODENOSCOPY (EGD) WITH PROPOFOL N/A  10/03/2020   Surgeon: [name], DO; Normal esophagus, Gastritis biopsied (nonspecific reactive gastropathy, negative for H. pylori), normal examined duodenum.   MASS EXCISION N/A 12/06/2017   Procedure: EXCISION 1CM CYST ON FACE;  Surgeon: Virl Cagey, MD;  Location: [facility];  Service: General;  Laterality: N/A;   MULTIPLE EXTRACTIONS WITH ALVEOLOPLASTY Bilateral 04/12/2015   Procedure: BILATERAL MULTIPLE EXTRACTIONS NUMBERS TWENTY, TWENTY ONE, TWENTY TWO, TWENTY THREE, TWENTY FOUR, TWENTY FIVE, TWENTY EIGHT, TWENTY NINE WITH ALVEOLOPLASTY;  Surgeon: Diona Browner, DDS;  Location: [facility];  Service: Oral Surgery;  Laterality: Bilateral;   POLYPECTOMY  08/23/2018   Procedure: POLYPECTOMY;  Surgeon: Danie Binder, MD;  [facility];  Service: Endoscopy;;   TUBAL LIGATION      Family History  Adopted: Yes  Problem Relation Age of Onset   Heart disease Other    Colon cancer Neg Hx     Social History   Tobacco Use   Smoking status: Every Day    Packs/day: 1.00    Years: 31.00    Pack years: 31.00    Types: Cigarettes   Smokeless tobacco: Never  Vaping Use   Vaping Use: Never used  Substance Use Topics   Alcohol use: Yes    Comment: occ   Drug use: No    Medications: I have  reviewed the patient's current medications. Allergies as of 01/07/2021       Reactions   Bee Venom Anaphylaxis   Tramadol Hives   Asa [aspirin] Other (See Comments)   Nose bleed   Latex Itching   Peppermint Flavor Other (See Comments)   Does not like taste        Medication List        Accurate as of January 07, 2021  9:50 AM. If you have any questions, ask your nurse or doctor.          labetalol 200 MG tablet Commonly known as: NORMODYNE Take 200 mg by mouth 2 (two) times daily.   Linzess 290 MCG Caps capsule Generic drug: linaclotide TAKE 1 CAPSULE (290 MCG TOTAL) BY MOUTH DAILY BEFORE BREAKFAST.   meloxicam 15 MG tablet Commonly known as: MOBIC Take 15  mg by mouth daily.   naproxen 500 MG tablet Commonly known as: NAPROSYN Take 500 mg by mouth 2 (two) times a day.   omeprazole 20 MG capsule Commonly known as: PRILOSEC Take 1 capsule (20 mg total) by mouth 2 (two) times daily before a meal.   oxyCODONE-acetaminophen 5-325 MG tablet Commonly known as: PERCOCET/ROXICET Take 1 tablet by mouth every 6 (six) hours as needed for severe pain.   pravastatin 40 MG tablet Commonly known as: PRAVACHOL Take 40 mg by mouth daily.   sulfamethoxazole-trimethoprim 800-160 MG tablet Commonly known as: BACTRIM DS Take 1 tablet by mouth 2 (two) times daily for 7 days.   traZODone 50 MG tablet Commonly known as: DESYREL Take 50 mg by mouth at bedtime.   Vitamin D-3 125 MCG (5000 UT) Tabs Take 5,000 Units by mouth daily.         ROS:  A comprehensive review of systems was negative except for: Gastrointestinal: positive for gluteal pain and drainage  Blood pressure (!) 149/93, pulse 97, temperature 98.7 F (37.1 C), temperature source Other (Comment), resp. rate 16, height 5\' 4"  (1.626 m), weight 279 lb (126.6 kg), last menstrual period 11/11/2016, SpO2 96 %. Physical Exam Vitals reviewed.  Constitutional:      Appearance: Normal appearance.  HENT:     Head: Normocephalic.     Nose: Nose normal.  Eyes:     Extraocular Movements: Extraocular movements intact.  Cardiovascular:     Rate and Rhythm: Normal rate and regular rhythm.  Pulmonary:     Effort: Pulmonary effort is normal.     Breath sounds: Normal breath sounds.  Abdominal:     General: There is no distension.     Palpations: Abdomen is soft.  Genitourinary:    Comments: Left gluteal abscess, distant from the anus, draining with two small pin point openings, induration and tender, minor redness  Musculoskeletal:        General: Normal range of motion.     Cervical back: Normal range of motion.  Skin:    General: Skin is warm.  Neurological:     General: No focal  deficit present.     Mental Status: She is alert and oriented to person, place, and time.  Psychiatric:        Mood and Affect: Mood normal.        Behavior: Behavior normal.    Procedure:  Incision and drainage of gluteal abscess on left Diagnosis: Gluteal abscess on left  Description: The area was prepped with betadine. Lidocaine 1% was injected. A 11 blade was used to open the area between the two pin  point openings. Loculations were broken up with a hemostat. There was purulent bloody drainage. The area was packed with a gauze for hemostasis. An ABD was applied.   Assessment & Plan:  Autumn [name] is a 55 y.o. female with a left gluteal abscess. Drained now in the ED.  Sitz baths. Remove the packing by tomorrow. It is ok if it falls out. Expect some bleeding.  Roxicodone and Augmentin sent to pharmacy  Future Appointments  Date Time Provider [place]  01/09/2021  1:45 PM Virl Cagey, MD [facility] None  05/28/2021  9:30 AM Erenest Rasher, PA-C [facility]     All questions were answered to the satisfaction of the patient and family.    Virl Cagey 01/07/2021, 9:50 AM

## 2021-01-07 NOTE — Patient Instructions (Signed)
Sitz baths. Remove the packing by tomorrow. It is ok if it falls out. Expect some bleeding.   How to Take a [employer] A sitz bath is a warm water bath that may be used to care for your rectum, genital area, or the area between your rectum and genitals (perineum). In a sitz bath, the water only comes up to your hips and covers your buttocks. A sitz bath may be done in a bathtub or with a portable sitz bath that fits over the toilet. Your health care provider may recommend a sitz bath to help: Relieve pain and discomfort after delivering a baby. Relieve pain and itching from hemorrhoids or anal fissures. Relieve pain after certain surgeries. Relax muscles that are sore or tight. How to take a sitz bath Take 3-4 sitz baths a day, or as many as told by your health care provider. Bathtub sitz bath To take a sitz bath in a bathtub: Partially fill a bathtub with warm water. The water should be deep enough to cover your hips and buttocks when you are sitting in the tub. Follow your health care provider's instructions if you are told to put medicine in the water. Sit in the water. Open the tub drain a little, and leave it open during your bath. Turn on the warm water again, enough to replace the water that is draining out. Keep the water running throughout your bath. This helps keep the water at the right level and temperature. Soak in the water for 15-20 minutes, or as long as told by your health care provider. When you are done, be careful when you stand up. You may feel dizzy. After the sitz bath, pat yourself dry. Do not rub your skin to dry it.  Over-the-toilet sitz bath To take a sitz bath with an over-the-toilet basin: Follow the manufacturer's instructions. Fill the basin with warm water. Follow your health care provider's instructions if you were told to put medicine in the water. Sit on the seat. Make sure the water covers your buttocks and perineum. Soak in the water for 15-20 minutes,  or as long as told by your health care provider. After the sitz bath, pat yourself dry. Do not rub your skin to dry it. Clean and dry the basin between uses. Discard the basin if it cracks, or according to the manufacturer's instructions.  Contact a health care provider if: Your pain or itching gets worse. Do not continue with sitz baths if your symptoms get worse. You have new symptoms. Do not continue with sitz baths until you talk with your health care provider. Summary A sitz bath is a warm water bath in which the water only comes up to your hips and covers your buttocks. A sitz bath may help relieve pain and discomfort after delivering a baby. It also may help with pain and itching from hemorrhoids or anal fissures, or pain after certain surgeries. It can also help to relax muscles that are sore or tight. Take 3-4 sitz baths a day, or as many as told by your health care provider. Soak in the water for 15-20 minutes. Do not continue with sitz baths if your symptoms get worse. This information is not intended to replace advice given to you by your health care provider. Make sure you discuss any questions you have with your health care provider. Document Revised: 09/28/2019 Document Reviewed: 09/28/2019 Elsevier Patient Education  2022 [employer].  Incision and Drainage, Care After This sheet gives you information about  how to care for yourself after your procedure. Your health care provider may also give you more specific instructions. If you have problems or questions, contact your health care provider. What can I expect after the procedure? After the procedure, it is common to have: Pain or discomfort around the incision site. Blood, fluid, or pus (drainage) from the incision. Redness and firm skin around the incision site. Follow these instructions at home: Medicines Take over-the-counter and prescription medicines only as told by your health care provider. If you were prescribed  an antibiotic medicine, use or take it as told by your health care provider. Do not stop using the antibiotic even if you start to feel better. Wound care Follow instructions from your health care provider about how to take care of your wound. Make sure you: Wash your hands with soap and water before and after you change your bandage (dressing). If soap and water are not available, use hand sanitizer. Change your dressing and packing as told by your health care provider. If your dressing is dry or stuck when you try to remove it, moisten or wet the dressing with saline or water so that it can be removed without harming your skin or tissues. If your wound is packed, leave it in place until your health care provider tells you to remove it. To remove the packing, moisten or wet the packing with saline or water so that it can be removed without harming your skin or tissues. Leave stitches (sutures), skin glue, or adhesive strips in place. These skin closures may need to stay in place for 2 weeks or longer. If adhesive strip edges start to loosen and curl up, you may trim the loose edges. Do not remove adhesive strips completely unless your health care provider tells you to do that. Check your wound every day for signs of infection. Check for: More redness, swelling, or pain. More fluid or blood. Warmth. Pus or a bad smell. If you were sent home with a drain tube in place, follow instructions from your health care provider about: How to empty it. How to care for it at home.  General instructions Rest the affected area. Return to your normal activities as told by your health care provider. Ask your health care provider what activities are safe for you. Your health care provider may put you on activity or lifting restrictions. The incision will continue to drain. It is normal to have some clear or slightly bloody drainage. The amount of drainage should lessen each day. Do not apply any creams,  ointments, or liquids unless you have been told to by your health care provider. Keep all follow-up visits as told by your health care provider. This is important. Contact a health care provider if: Your cyst or abscess returns. You have a fever or chills. You have more redness, swelling, or pain around your incision. You have more fluid or blood coming from your incision. Your incision feels warm to the touch. You have pus or a bad smell coming from your incision. You have red streaks above or below the incision site. Get help right away if: You have severe pain or bleeding. You cannot eat or drink without vomiting. You have decreased urine output. You become short of breath. You have chest pain. You cough up blood. The affected area becomes numb or starts to tingle. These symptoms may represent a serious problem that is an emergency. Do not wait to see if the symptoms will go away. Get  medical help right away. Call your local emergency services (911 in the [place] Do not drive yourself to the hospital. Summary After this procedure, it is common to have fluid, blood, or pus coming from the surgery site. Follow all home care instructions. You will be told how to take care of your incision, how to check for infection, and how to take medicines. If you were prescribed an antibiotic medicine, take it as told by your health care provider. Do not stop taking the antibiotic even if you start to feel better. Contact a health care provider if you have increased redness, swelling, or pain around your incision. Get help right away if you have chest pain, you vomit, you cough up blood, or you have shortness of breath. Keep all follow-up visits as told by your health care provider. This is important. This information is not intended to replace advice given to you by your health care provider. Make sure you discuss any questions you have with your health care provider. Document Revised: 12/13/2017  Document Reviewed: 12/13/2017 Elsevier Patient Education  2022 [employer].

## 2021-01-09 ENCOUNTER — Other Ambulatory Visit: Payer: Self-pay

## 2021-01-09 ENCOUNTER — Ambulatory Visit (INDEPENDENT_AMBULATORY_CARE_PROVIDER_SITE_OTHER): Payer: Medicare HMO | Admitting: General Surgery

## 2021-01-09 VITALS — BP 170/81 | HR 112 | Temp 98.6°F | Resp 18 | Ht 64.0 in | Wt 279.0 lb

## 2021-01-09 DIAGNOSIS — L0231 Cutaneous abscess of buttock: Secondary | ICD-10-CM

## 2021-01-09 MED ORDER — OXYCODONE HCL 5 MG PO TABS: 5.0000 mg | ORAL_TABLET | ORAL | 0 refills | Status: DC | PRN

## 2021-01-09 NOTE — Progress Notes (Signed)
[facility]  Draining and having improved pain. Needing the pain meds. Does get hot flash with the meds. No fevers.  BP (!) 170/81    Pulse (!) 112    Temp 98.6 F (37 C) (Oral)    Resp 18    Ht 5\' 4"  (1.626 m)    Wt 279 lb (126.6 kg)    LMP 11/11/2016    SpO2 98%    BMI 47.89 kg/m  Less induration Site open and draining  Future Appointments  Date Time Provider [place]  01/16/2021  2:45 PM Virl Cagey, MD [facility] None  05/28/2021  9:30 AM Erenest Rasher, PA-C [facility] [place], MD [facility] [address] [place], [place] [address] [phone] (office)

## 2021-01-09 NOTE — Patient Instructions (Addendum)
Continue sitz baths. Continue pain meds as needed. Finish antibiotic.  The [facility] will not be able to refill until 12/17 (Saturday)

## 2021-01-14 ENCOUNTER — Ambulatory Visit: Payer: Medicare HMO | Admitting: General Surgery

## 2021-01-16 ENCOUNTER — Other Ambulatory Visit: Payer: Self-pay

## 2021-01-16 ENCOUNTER — Encounter: Payer: Self-pay | Admitting: General Surgery

## 2021-01-16 ENCOUNTER — Ambulatory Visit (INDEPENDENT_AMBULATORY_CARE_PROVIDER_SITE_OTHER): Payer: Medicare HMO | Admitting: General Surgery

## 2021-01-16 VITALS — BP 136/85 | HR 88 | Temp 97.3°F | Resp 16 | Ht 64.0 in | Wt 279.0 lb

## 2021-01-16 DIAGNOSIS — L0231 Cutaneous abscess of buttock: Secondary | ICD-10-CM

## 2021-01-16 MED ORDER — AMOXICILLIN-POT CLAVULANATE 875-125 MG PO TABS: 1.0000 | ORAL_TABLET | Freq: Two times a day (BID) | ORAL | 0 refills | Status: AC

## 2021-01-16 NOTE — Progress Notes (Signed)
[facility]  Left buttock site is healing, significantly less induration. Less tender. Mild flare of hemorrhoid but no gross bleeding.   BP 136/85    Pulse 88    Temp (!) 97.3 F (36.3 C) (Other (Comment))    Resp 16    Ht 5\' 4"  (1.626 m)    Wt 279 lb (126.6 kg)    LMP 11/11/2016    SpO2 98%    BMI 47.89 kg/m   Continue to keep area clean, neosporin ok Sitz baths Will prescribe augmentin to have on had for the holiday pending this starting to flare back up Will see in a few weeks   Future Appointments  Date Time Provider [place]  01/30/2021  1:15 PM Virl Cagey, MD [facility] None  05/28/2021  9:30 AM Erenest Rasher, PA-C [facility] [place], MD [facility] [address] [place], [place] [address] [phone] (office)

## 2021-01-16 NOTE — Patient Instructions (Signed)
Have the antibiotic on hand pending any issues over holiday. Continue doing what you are doing otherwise. Neosporin to area is fine. Hemorrhoids will improve as you start to feel better.

## 2021-01-30 ENCOUNTER — Other Ambulatory Visit: Payer: Self-pay

## 2021-01-30 ENCOUNTER — Ambulatory Visit (INDEPENDENT_AMBULATORY_CARE_PROVIDER_SITE_OTHER): Payer: Medicare HMO | Admitting: General Surgery

## 2021-01-30 ENCOUNTER — Encounter: Payer: Self-pay | Admitting: General Surgery

## 2021-01-30 VITALS — BP 136/84 | HR 95 | Temp 98.3°F | Resp 16 | Ht 64.0 in | Wt 282.0 lb

## 2021-01-30 DIAGNOSIS — M25532 Pain in left wrist: Secondary | ICD-10-CM

## 2021-01-30 DIAGNOSIS — L0231 Cutaneous abscess of buttock: Secondary | ICD-10-CM

## 2021-01-30 MED ORDER — OXYCODONE HCL 5 MG PO TABS: 5.0000 mg | ORAL_TABLET | ORAL | 0 refills | Status: DC | PRN

## 2021-01-30 NOTE — Patient Instructions (Signed)
Wrist Pain, Adult There are many things that can cause wrist pain. Some common causes include: An injury to the wrist area, such as a sprain, strain, or fracture. Overuse of the joint. A condition that causes increased pressure on a nerve in the wrist (carpal tunnel syndrome). Wear and tear of the joints that occurs with aging (osteoarthritis). Other types of joint inflammation and stiffness (arthritis). Sometimes, the cause of wrist pain is not known. Often, the pain goes away when you follow instructions from your health care provider for relieving pain at home, such as resting the wrist, icing the wrist, or using a splint or an elastic wrap for a short time. If your wrist pain continues, it is important to tell your health care provider. Follow these instructions at home: If you have a splint or elastic wrap: Wear the splint or wrap as told by your health care provider. Remove it only as told by your health care provider. Ask your health care provider if you may remove it for bathing. Loosen the splint or wrap if your fingers tingle, become numb, or turn cold and blue. Check the skin around the splint or wrap every day. Tell your health care provider about any concerns. Keep the splint or wrap clean. If the splint or wrap is not waterproof: Do not let it get wet. Cover it with a watertight covering when you take a bath or shower. Managing pain, stiffness, and swelling  If directed, put ice on the painful area. To do this: If you have a removable splint or wrap, remove it as told by your health care provider. Put ice in a plastic bag. Place a towel between your skin and the bag or between your splint or wrap and the bag. Leave the ice on for 20 minutes, 2-3 times a day. Move your fingers often to reduce stiffness and swelling. Raise (elevate) the injured area above the level of your heart while you are sitting or lying down. Activity Rest your affected wrist as told by your health care  provider. Return to your normal activities as told by your health care provider. Ask your health care provider what activities are safe for you. Ask your health care provider when it is safe to drive if you have a splint or wrap on your wrist. Do exercises as told by your health care provider. General instructions Pay attention to any changes in your symptoms. Take over-the-counter and prescription medicines only as told by your health care provider. Keep all follow-up visits as told by your health care provider. This is important. Contact a health care provider if: You have a sudden, sharp pain in the wrist, hand, or arm that is different or new. The swelling or bruising on your wrist or hand gets worse. Your skin becomes red, gets a rash, or has open sores. Your pain does not get better or it gets worse. You have a fever or chills. Get help right away if: You lose feeling in your fingers or hand. Your fingers turn white, very red, or cold and blue. You cannot move your fingers. Summary Wrist pain in an adult has many different causes. If your wrist pain continues, it is important to tell your health care provider. You may need to wear a splint or an elastic wrap for a short period of time. Return to your normal activities as told by your health care provider. Ask your health care provider what activities are safe for you. This information is not intended  to replace advice given to you by your health care provider. Make sure you discuss any questions you have with your health care provider. Document Revised: 12/01/2018 Document Reviewed: 12/01/2018 Elsevier Patient Education  2022 [employer].

## 2021-01-31 NOTE — Progress Notes (Signed)
[facility]  Doing well and left buttock abscess area healing. Very small area of granulation that is not epithelized. She is doing well overall but is still having pain at night.  BP 136/84    Pulse 95    Temp 98.3 F (36.8 C) (Oral)    Resp 16    Ht 5\' 4"  (1.626 m)    Wt 282 lb (127.9 kg)    LMP 11/11/2016    SpO2 98%    BMI 48.41 kg/m  Healing site No induration  Left buttock abscess resolved.  One time prescription for Roxicodone refilled.  PRN Follow up   Curlene Labrum, MD [facility] [address] [place], [place] [address] [phone] (office)

## 2021-02-13 ENCOUNTER — Ambulatory Visit: Payer: Medicare HMO | Admitting: Gastroenterology

## 2021-05-26 NOTE — Progress Notes (Deleted)
Referring Provider: Leslie Andrea, MD Primary Care Physician:  Leslie Andrea, MD Primary GI Physician: Dr. Abbey Chatters  No chief complaint on file.   HPI:   Autumn [name] is a 56 y.o. female  with history of constipation, GERD, dysphagia, rectal bleeding due to internal hemorrhoids.  Colonoscopy up-to-date July 2020 with 3 hyperplastic polyps, external/internal hemorrhoids, tortuous left colon, due for repeat in 2030.  She is presenting today for follow-up.   Recent imaging/procedures to address dysphagia/sore throat:  CT neck 09/17/2020: Soft tissue density in the vallecula most likely reflect prominent lingual tonsils, prominent palatine tonsils which moderately narrows the oropharyngeal airway.   EGD 10/03/2020: Normal esophagus, Gastritis biopsied (nonspecific reactive gastropathy, negative for H. pylori), normal examined duodenum.  Recommended Prilosec 20 mg twice daily.   Thyroid ultrasound 11/22/2020: 1.4 cm left mid thyroid TR 4 nodule meets criteria for follow-up in 1 year.  No biopsy indicated.  Additional 2 nodules also noted.  Right neck palpable abnormality appears to correlate with mildly enlarged right submandibular lymph node favorable to be reactive.    Last seen in our office 11/28/2020. Reported 2 weeks of swelling on the right side of the neck with some soreness with swallowing, cough, sinus drainage, nasal congestion, feeling hoarse in the morning.  Some shortness of breath with walking, but no fever or body aches.  GERD is well controlled on omeprazole 20 mg twice daily for dysphagia.  Constipation well controlled with this 29 mcg daily.  No alarm symptoms.  Some bloating in the morning and waking up in the setting of eating close to bedtime.  On exam, she had slightly enlarged and tender submandibular lymph node.  Recommended follow-up with PCP and Dr. Benjamine Mola on sore throat.  Otherwise, continue current medications, eat 4-6 small meals daily, and no eating within 3  hours of going to bed.  Follow-up in 6 months.  Today:  GERD:  Constipation:   Past Medical History:  Diagnosis Date   Anxiety    Panic attacks- "cries"   Arthritis    Complication of anesthesia    had some hallucations   Fibroids, submucosal 12/02/2016   GERD (gastroesophageal reflux disease)    H/O colonoscopy    Hypertension    Rectal bleeding    in setting of hemorrhoids. Colonoscopy in July 2020   Shortness of breath dyspnea    with exertion   Thickened endometrium 12/02/2016    Past Surgical History:  Procedure Laterality Date   BALLOON DILATION N/A 10/03/2020   Procedure: BALLOON DILATION;  Surgeon: [name], DO;  [facility];  Service: Endoscopy;  Laterality: N/A;   BIOPSY  10/03/2020   Procedure: BIOPSY;  Surgeon: [name], DO;  [facility];  Service: Endoscopy;;   BREAST LUMPECTOMY Right    age 29's-benign   CESAREAN SECTION     x3   COLONOSCOPY     COLONOSCOPY WITH PROPOFOL N/A 08/23/2018   PROPOFOL;  Surgeon: Danie Binder, MD; three 2-4 mm polyps in the rectum and sigmoid colon, external and internal hemorrhoids, tortuous left colon.  Pathology with 3 hyperplastic polyps.  Next colonoscopy in 2030.   DENTAL SURGERY     ESOPHAGOGASTRODUODENOSCOPY (EGD) WITH PROPOFOL N/A 10/03/2020   Surgeon: [name], DO; Normal esophagus, Gastritis biopsied (nonspecific reactive gastropathy, negative for H. pylori), normal examined duodenum.   MASS EXCISION N/A 12/06/2017   Procedure: EXCISION 1CM CYST ON FACE;  Surgeon: Virl Cagey, MD;  Location: [facility];  Service: General;  Laterality: N/A;   MULTIPLE EXTRACTIONS WITH ALVEOLOPLASTY Bilateral 04/12/2015   Procedure: BILATERAL MULTIPLE EXTRACTIONS NUMBERS TWENTY, TWENTY ONE, TWENTY TWO, TWENTY THREE, TWENTY FOUR, TWENTY FIVE, TWENTY EIGHT, TWENTY NINE WITH ALVEOLOPLASTY;  Surgeon: Diona Browner, DDS;  Location: [facility];  Service: Oral Surgery;  Laterality: Bilateral;    POLYPECTOMY  08/23/2018   Procedure: POLYPECTOMY;  Surgeon: Danie Binder, MD;  [facility];  Service: Endoscopy;;   TUBAL LIGATION      Current Outpatient Medications  Medication Sig Dispense Refill   Cholecalciferol (VITAMIN D-3) 125 MCG (5000 UT) TABS Take 5,000 Units by mouth daily.     labetalol (NORMODYNE) 200 MG tablet Take 200 mg by mouth 2 (two) times daily.     LINZESS 290 MCG CAPS capsule TAKE 1 CAPSULE (290 MCG TOTAL) BY MOUTH DAILY BEFORE BREAKFAST. 90 capsule 3   meloxicam (MOBIC) 15 MG tablet Take 15 mg by mouth daily.     naproxen (NAPROSYN) 500 MG tablet Take 500 mg by mouth 2 (two) times a day.     omeprazole (PRILOSEC) 20 MG capsule Take 1 capsule (20 mg total) by mouth 2 (two) times daily before a meal. 180 capsule 1   oxyCODONE (ROXICODONE) 5 MG immediate release tablet Take 1 tablet (5 mg total) by mouth every 4 (four) hours as needed for severe pain or breakthrough pain. 10 tablet 0   pravastatin (PRAVACHOL) 40 MG tablet Take 40 mg by mouth daily.     traZODone (DESYREL) 50 MG tablet Take 50 mg by mouth at bedtime.     No current facility-administered medications for this visit.    Allergies as of 05/28/2021 - Review Complete 01/30/2021  Allergen Reaction Noted   Bee venom Anaphylaxis 11/04/2010   Tramadol Hives 11/04/2010   Asa [aspirin] Other (See Comments) 08/14/2015   Latex Itching 12/26/2018   Peppermint flavor Other (See Comments) 03/01/2018    Family History  Adopted: Yes  Problem Relation Age of Onset   Heart disease Other    Colon cancer Neg Hx     Social History   Socioeconomic History   Marital status: Significant Other    Spouse name: Not on file   Number of children: Not on file   Years of education: Not on file   Highest education level: Not on file  Occupational History   Not on file  Tobacco Use   Smoking status: Every Day    Packs/day: 1.00    Years: 31.00    Pack years: 31.00    Types: Cigarettes   Smokeless  tobacco: Never  Vaping Use   Vaping Use: Never used  Substance and Sexual Activity   Alcohol use: Yes    Comment: occ   Drug use: No   Sexual activity: Yes    Birth control/protection: Surgical    Comment: tubal  Other Topics Concern   Not on file  Social History Narrative   Not on file   Social Determinants of Health   Financial Resource Strain: Not on file  Food Insecurity: Not on file  Transportation Needs: Not on file  Physical Activity: Not on file  Stress: Not on file  Social Connections: Not on file    Review of Systems: Gen: Denies fever, chills, cold or flulike symptoms, presyncope, syncope. CV: Denies chest pain, palpitations. Resp: Denies dyspnea or cough. GI: See HPI Heme: See HPI  Physical Exam: LMP 11/11/2016  General:   Alert and  oriented. No distress noted. Pleasant and cooperative.  Head:  Normocephalic and atraumatic. Eyes:  Conjuctiva clear without scleral icterus. Heart:  S1, S2 present without murmurs appreciated. Lungs:  Clear to auscultation bilaterally. No wheezes, rales, or rhonchi. No distress.  Abdomen:  +BS, soft, non-tender and non-distended. No rebound or guarding. No HSM or masses noted. Msk:  Symmetrical without gross deformities. Normal posture. Extremities:  Without edema. Neurologic:  Alert and  oriented x4 Psych:  Normal mood and affect.    Assessment:     Plan:  ***   Aliene Altes, PA-C [facility] 05/28/2021

## 2021-05-27 ENCOUNTER — Other Ambulatory Visit (HOSPITAL_COMMUNITY): Payer: Self-pay | Admitting: Nurse Practitioner

## 2021-05-27 ENCOUNTER — Other Ambulatory Visit: Payer: Self-pay | Admitting: Family Medicine

## 2021-05-27 ENCOUNTER — Other Ambulatory Visit (HOSPITAL_COMMUNITY): Payer: Self-pay | Admitting: Family Medicine

## 2021-05-27 ENCOUNTER — Other Ambulatory Visit: Payer: Self-pay | Admitting: Nurse Practitioner

## 2021-05-28 ENCOUNTER — Ambulatory Visit: Payer: Medicare HMO | Admitting: Gastroenterology

## 2021-05-28 ENCOUNTER — Encounter: Payer: Self-pay | Admitting: Internal Medicine

## 2021-06-02 ENCOUNTER — Other Ambulatory Visit: Payer: Self-pay | Admitting: Nurse Practitioner

## 2021-06-02 ENCOUNTER — Other Ambulatory Visit (HOSPITAL_COMMUNITY): Payer: Self-pay | Admitting: Nurse Practitioner

## 2021-06-02 DIAGNOSIS — R110 Nausea: Secondary | ICD-10-CM

## 2021-06-02 DIAGNOSIS — R109 Unspecified abdominal pain: Secondary | ICD-10-CM

## 2021-06-09 ENCOUNTER — Encounter: Payer: Medicare HMO | Admitting: Obstetrics & Gynecology

## 2021-06-27 ENCOUNTER — Encounter (HOSPITAL_COMMUNITY): Payer: Self-pay | Admitting: Physical Therapy

## 2021-06-27 ENCOUNTER — Ambulatory Visit (HOSPITAL_COMMUNITY): Payer: Medicare HMO | Attending: Nurse Practitioner | Admitting: Physical Therapy

## 2021-06-27 DIAGNOSIS — R42 Dizziness and giddiness: Secondary | ICD-10-CM | POA: Diagnosis present

## 2021-06-27 NOTE — Therapy (Signed)
OUTPATIENT PHYSICAL THERAPY VESTIBULAR EVALUATION     Patient Name: Autumn [name] MRN: [id number] DOB:[birth year], 56 y.o., female Today's Date: 06/27/2021 PCP: Carlis Abbott, NP  REFERRING PROVIDER: Carlis Abbott, NP    PT End of Session - 06/27/21 1127     Visit Number 1    Number of Visits 1   Date for PT Re-Evaluation 07/27/21             Past Medical History:  Diagnosis Date   Anxiety    Panic attacks- "cries"   Arthritis    Complication of anesthesia    had some hallucations   Fibroids, submucosal 12/02/2016   GERD (gastroesophageal reflux disease)    H/O colonoscopy    Hypertension    Rectal bleeding    in setting of hemorrhoids. Colonoscopy in July 2020   Shortness of breath dyspnea    with exertion   Thickened endometrium 12/02/2016   Past Surgical History:  Procedure Laterality Date   BALLOON DILATION N/A 10/03/2020   Procedure: BALLOON DILATION;  Surgeon: [name], DO;  [facility];  Service: Endoscopy;  Laterality: N/A;   BIOPSY  10/03/2020   Procedure: BIOPSY;  Surgeon: [name], DO;  [facility];  Service: Endoscopy;;   BREAST LUMPECTOMY Right    age 59's-benign   CESAREAN SECTION     x3   COLONOSCOPY     COLONOSCOPY WITH PROPOFOL N/A 08/23/2018   PROPOFOL;  Surgeon: Danie Binder, MD; three 2-4 mm polyps in the rectum and sigmoid colon, external and internal hemorrhoids, tortuous left colon.  Pathology with 3 hyperplastic polyps.  Next colonoscopy in 2030.   DENTAL SURGERY     ESOPHAGOGASTRODUODENOSCOPY (EGD) WITH PROPOFOL N/A 10/03/2020   Surgeon: [name], DO; Normal esophagus, Gastritis biopsied (nonspecific reactive gastropathy, negative for H. pylori), normal examined duodenum.   MASS EXCISION N/A 12/06/2017   Procedure: EXCISION 1CM CYST ON FACE;  Surgeon: Virl Cagey, MD;  [facility];  Service: General;  Laterality: N/A;   MULTIPLE EXTRACTIONS WITH ALVEOLOPLASTY  Bilateral 04/12/2015   Procedure: BILATERAL MULTIPLE EXTRACTIONS NUMBERS TWENTY, TWENTY ONE, TWENTY TWO, TWENTY THREE, TWENTY FOUR, TWENTY FIVE, TWENTY EIGHT, TWENTY NINE WITH ALVEOLOPLASTY;  Surgeon: Diona Browner, DDS;  Location: [facility];  Service: Oral Surgery;  Laterality: Bilateral;   POLYPECTOMY  08/23/2018   Procedure: POLYPECTOMY;  Surgeon: Danie Binder, MD;  [facility];  Service: Endoscopy;;   TUBAL LIGATION     Patient Active Problem List   Diagnosis Date Noted   Abscess, gluteal, left 01/07/2021   Sore throat 11/28/2020   Pill dysphagia 09/03/2020   Bloating 09/03/2020   Early satiety 09/03/2020   Internal hemorrhoids 04/11/2020   Gastroesophageal reflux disease without esophagitis 10/25/2019   Infected sebaceous cyst of skin 07/27/2019   Chest pain 04/19/2019   Trichomonal vaginitis 03/20/2019   Bacterial vaginosis 03/20/2019   Morbid obesity with BMI of 40.0-44.9, adult ([place]) 03/15/2019   Rectal pain 01/13/2019   Constipation 03/01/2018   Rectal bleeding 03/01/2018   Abdominal pain 03/01/2018   Sebaceous cyst 11/30/2017   Fibroids, submucosal 12/02/2016   Thickened endometrium 12/02/2016   Stiffness of joint, not elsewhere classified, ankle and foot 06/27/2012   Pain in joint, ankle and foot 06/27/2012   Difficulty walking 06/27/2012   Ankle sprain 06/15/2012   ANKLE SPRAIN, RIGHT 09/16/2009   ANEMIA 03/07/2008   CIGARETTE SMOKER 03/07/2008   ABDOMINAL BLOATING 03/07/2008   CONSTIPATION,  CHRONIC, HX OF 03/07/2008    ONSET DATE: 05/26/2021  REFERRING DIAG: dizziness  THERAPY DIAG:  Dizziness and giddiness  Rationale for Evaluation and Treatment Rehabilitation  SUBJECTIVE:   SUBJECTIVE STATEMENT: PT states that if she puts her head back or get to hot she gets dizzy and feels like she is going to pass out.  She states that the room is not spinning. She states that it started after she took her medication.  It occurs infrequently now but it was  about 2x a day.  It mainly occurs when she is outside if she gets hot.   Pt accompanied by: self  PERTINENT HISTORY: ankle sprain, obesity    PAIN:  Are you having pain? No  PRECAUTIONS: None  WEIGHT BEARING RESTRICTIONS No  FALLS: Has patient fallen in last 6 months? No  LIVING ENVIRONMENT: Lives with: lives with their family Lives in: House/apartment PLOF: Independent  PATIENT GOALS less pain   OBJECTIVE:   DIAGNOSTIC FINDINGS: none  COGNITION: Overall cognitive status: Within functional limits for tasks assessed   SENSATION: WFL    POSTURE: No Significant postural limitations   Cervical ROM:    Active A/PROM (deg) eval  Flexion wfl  Extension wfl  Right lateral flexion wfl  Left lateral flexion wfl  Right rotation wfl  Left rotation wfl  (Blank rows = not tested)      VESTIBULAR ASSESSMENT   GENERAL OBSERVATION:  unremarkable     SYMPTOM BEHAVIOR:   Subjective history: as above    Non-Vestibular symptoms:  none   Type of dizziness: Lightheadedness/Faint; swimmy headed    Frequency: infrequent at this time    Duration: a couple of hours    Aggravating factors:  If she becomes overheated or looks up to far.    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 Sitting to supine 0  2. Supine to L side 0  3. Supine to R side 0  4. Supine to sitting 0  5. L Hallpike-Dix   6. Up from L    7. R Hallpike-Dix   8. Up from R    9. Sitting, head  tipped to L knee   10. Head up from L  knee   11. Sitting, head  tipped to R knee   12. Head up from R  knee   13. Sitting head turns x5 0  14.Sitting head nods x5 0  15. In stance, 180  turn to L  Off balance but not dizzy  16. In  stance, 180  turn to R Off balance but not dizzy     OTHOSTATICS: sitting: 142/88                            Standing:  138/90   Single leg stance 5 seconds B states this is normal.  VESTIBULAR TREATMENT:             06/27/2021:  Seated:  smooth pursuits, horizontal, vertical and diagonal x 10 reps each                 PATIENT EDUCATION: Education details: eye exercises Person educated: Patient Education method: Explanation, Demonstration, Verbal cues, and Handouts Education comprehension: verbalized understanding   GOALS: Goals reviewed with patient? Yes  SHORT TERM GOALS: Target date: 07/11/2021   Pt to complete smooth pursuit exercises in order to strengthen LT eye for decreased sx of dizziness Baseline: Goal status: INITIAL    ASSESSMENT:  CLINICAL IMPRESSION: Patient is a 56 y.o. female who was seen today for physical therapy evaluation and treatment for vertigo. Upon further investigation the pt states that nothing is spinning she is just feeling funny, light headed.  These sx have started since she was placed on new medication.  Testing for all canals are (-) for BPPV.  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  These exercises were given to the pt in hopes that it will help her feeling of being off balance.  Pt requests one time treatment with HEP    OBJECTIVE IMPAIRMENTS dizziness.   ACTIVITY  LIMITATIONS locomotion level  PARTICIPATION LIMITATIONS: community activity  PERSONAL FACTORS Fitness are also affecting patient's functional outcome.   REHAB POTENTIAL: Fair    CLINICAL DECISION MAKING: Stable/uncomplicated  EVALUATION COMPLEXITY: Low   PLAN: PT FREQUENCY: 1x/week  PT DURATION: 1 week  PLANNED INTERVENTIONS: Therapeutic exercises and Patient/Family education  PLAN FOR NEXT SESSION: one time treatment only   Rayetta Humphrey, PT CLT [phone]  06/27/2021, 12:33 AM

## 2021-06-30 IMAGING — MG DIGITAL SCREENING BILAT W/ TOMO W/ CAD
8 of 14 series · 8 of 40 positions shown · non-contrast
Comparison: Previous exam(s).

ACR Breast Density Category a: The breast tissue is almost entirely
fatty.

CLINICAL DATA: Screening.

EXAM:
DIGITAL SCREENING BILATERAL MAMMOGRAM WITH TOMO AND CAD

[R CC synth-2D (1 of 2)]
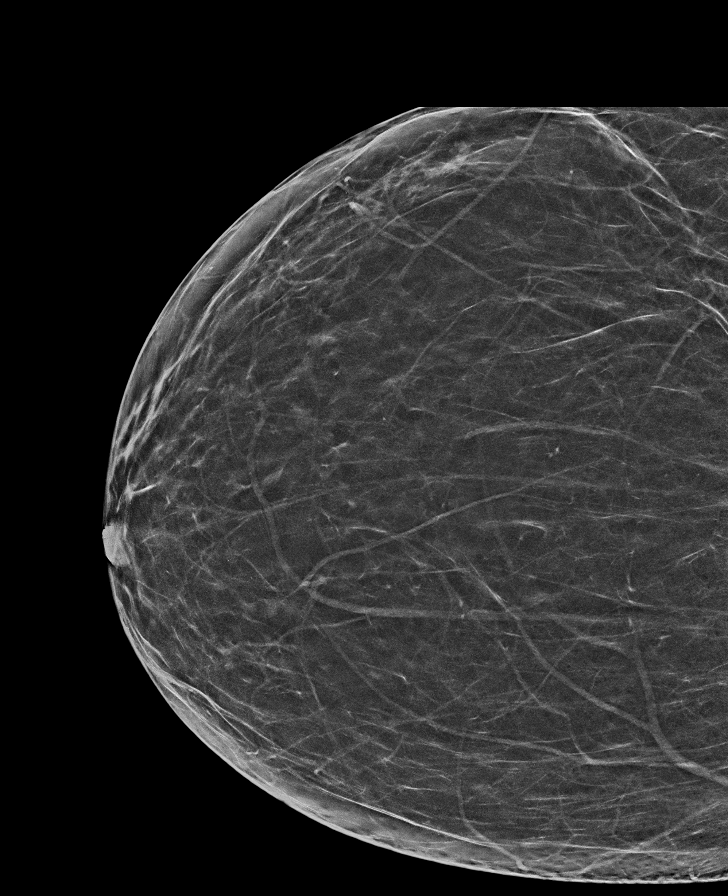

[L CC synth-2D (1 of 2)]
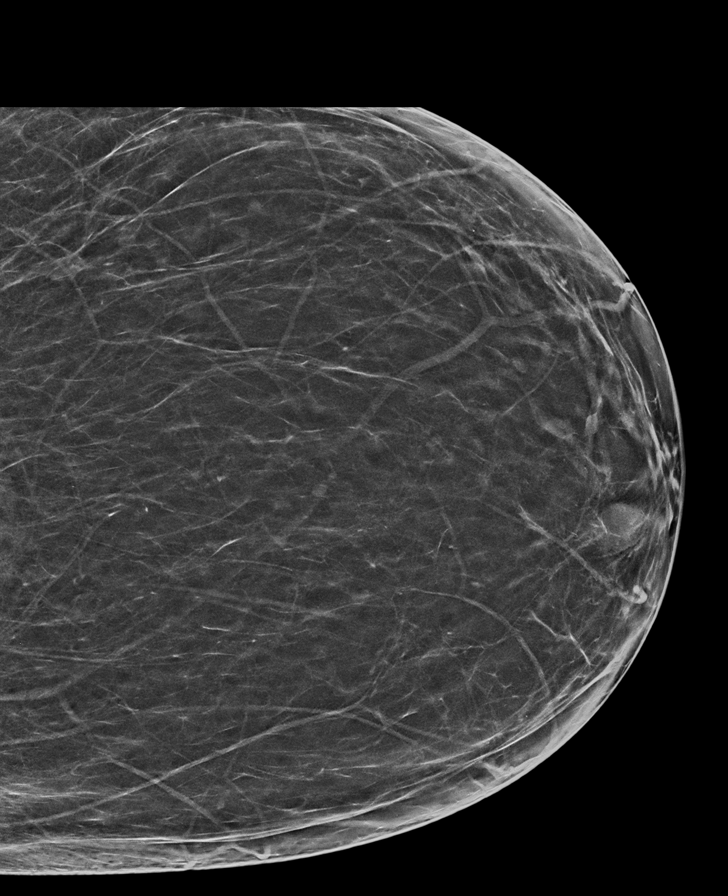

[L MLO synth-2D (1 of 2)]
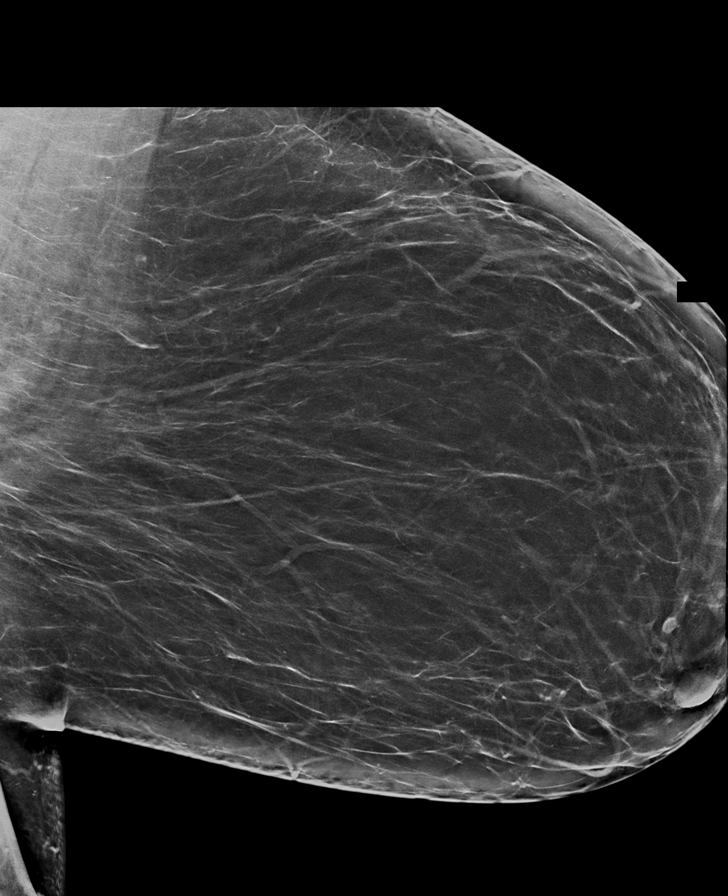

[L CC synth-2D (2 of 2)]
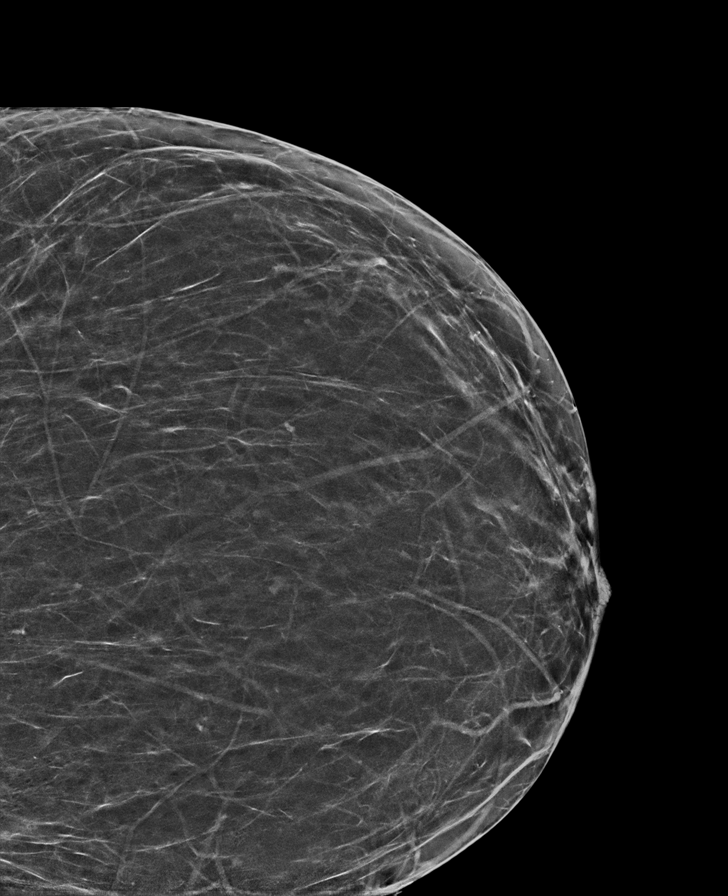

[R MLO synth-2D]
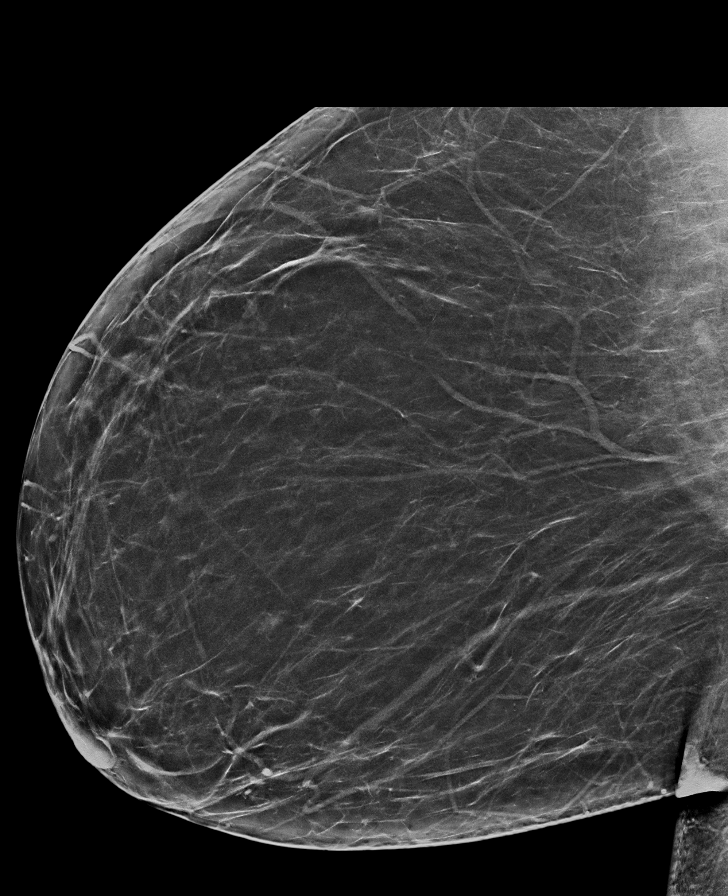

[R CC synth-2D (2 of 2)]
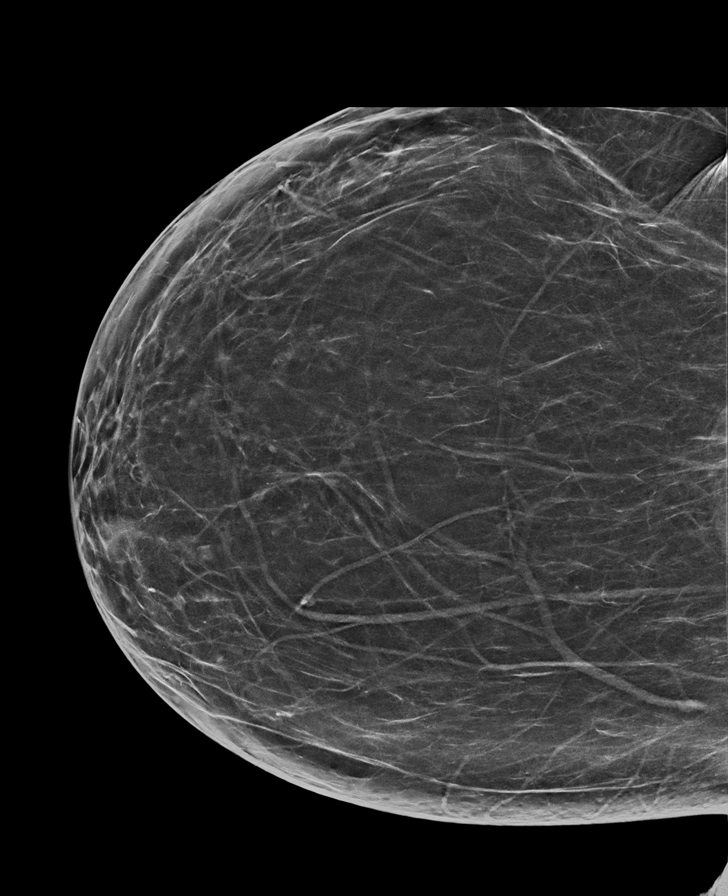

[L MLO synth-2D (2 of 2)]
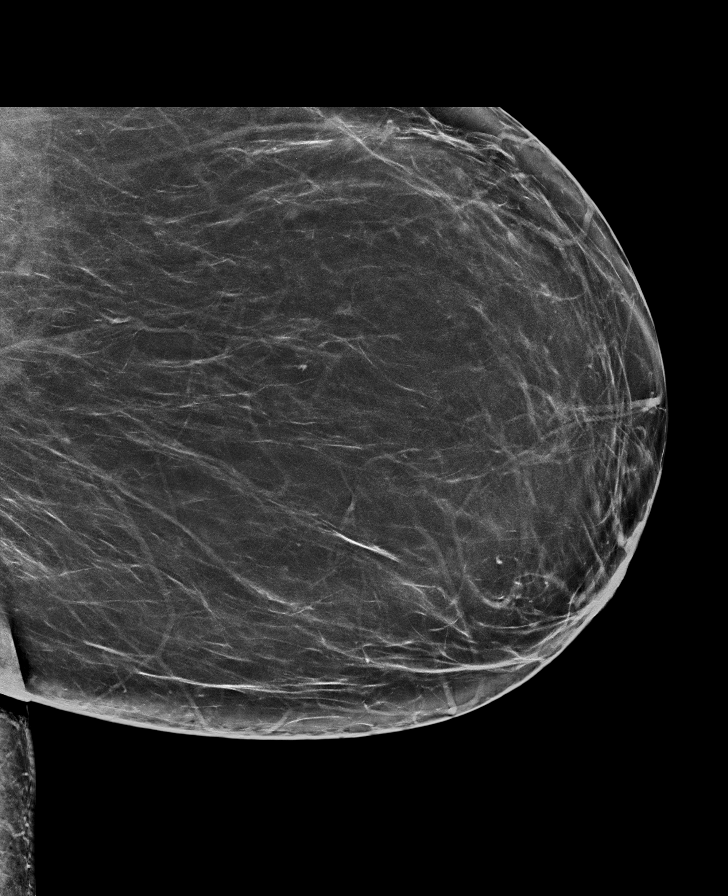

[L CC tomo · tomo slice 34/67.0]
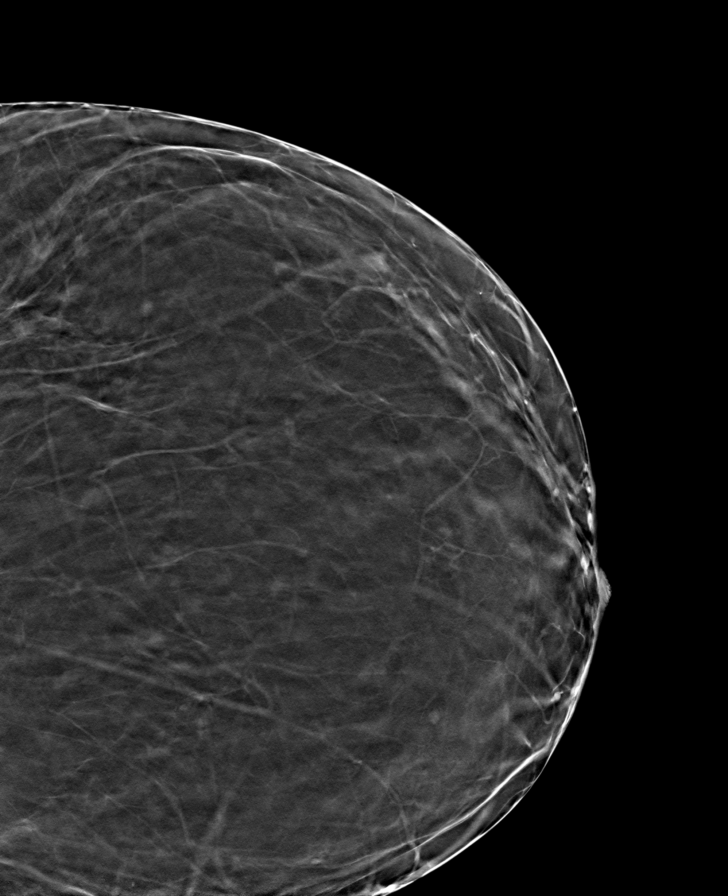

[8 of 40 positions shown; findings below may reference images not displayed]

FINDINGS: There are no findings suspicious for malignancy. Images were
processed with CAD.
IMPRESSION: No mammographic evidence of malignancy. A result letter of this
screening mammogram will be mailed directly to the patient.

RECOMMENDATION:
Screening mammogram in one year. (Code:8Y-Q-VVS)

BI-RADS CATEGORY  1: Negative.

## 2021-07-01 ENCOUNTER — Encounter: Payer: Self-pay | Admitting: Obstetrics & Gynecology

## 2021-07-01 ENCOUNTER — Ambulatory Visit (INDEPENDENT_AMBULATORY_CARE_PROVIDER_SITE_OTHER): Payer: Medicare HMO | Admitting: Obstetrics & Gynecology

## 2021-07-01 VITALS — BP 135/93 | HR 100 | Ht 67.0 in | Wt 278.8 lb

## 2021-07-01 DIAGNOSIS — N950 Postmenopausal bleeding: Secondary | ICD-10-CM | POA: Diagnosis not present

## 2021-07-01 DIAGNOSIS — Z6841 Body Mass Index (BMI) 40.0 and over, adult: Secondary | ICD-10-CM

## 2021-07-01 DIAGNOSIS — Z3202 Encounter for pregnancy test, result negative: Secondary | ICD-10-CM

## 2021-07-01 LAB — POCT URINE PREGNANCY: Preg Test, Ur: NEGATIVE

## 2021-07-01 NOTE — Progress Notes (Signed)
   GYN VISIT Patient name: Autumn [name] MRN [id number]  Date of birth: 05/01/1965 Chief Complaint:   postmenopausal bleeding in April (No bleeding in May or June; pt requested a pregnancy test)  History of Present Illness:   Autumn [name] is a 56 y.o. [phone] PM female being seen today for the following concerns:  PMB: Menses stopped in end of 77s.  Then in April had an episode of spotting like a period- bleeding lasted for 2 days, bright red spotting using about 1-2 tampons per day.  Denies pelvic pain.   She also noted white discharge out of her left breast with palpation x1.  She checks monthly and noted this last month.  Previously this has never happened  +bloating  Patient's last menstrual period was 11/11/2016.     03/14/2019   10:24 AM 11/19/2016   10:26 AM  Depression screen PHQ 2/9  Decreased Interest 0 0  Down, Depressed, Hopeless 1 1  PHQ - 2 Score 1 1     Review of Systems:   Pertinent items are noted in HPI Denies fever/chills, dizziness, headaches, visual disturbances, fatigue, shortness of breath, chest pain, abdominal pain, vomiting, Denies problems with bowel movements, urination, or intercourse unless otherwise stated above.  Pertinent History Reviewed:  Reviewed past medical,surgical, social, obstetrical and family history.  Reviewed problem list, medications and allergies. Physical Assessment:   Vitals:   07/01/21 1031  BP: (!) 135/93  Pulse: 100  Weight: 278 lb 12.8 oz (126.5 kg)  Height: '5\' 7"'$  (1.702 m)  Body mass index is 43.67 kg/m.       Physical Examination:   General appearance: alert, well appearing, and in no distress  Psych: mood appropriate, normal affect  Skin: warm & dry   Cardiovascular: RRR  Respiratory: normal respiratory effort, no distress, CTAB  Neck: supple, thyroid not enlarged  Abdomen: soft, non-tender   Pelvic: normal external genitalia and vulva, vagina-pink moist mucosa, no abnormal discharge or  lesions, cervix- no masses or lesions, Normal sized uterus- non-tender.  No adnexal masses or tenderness noted- exam limited due to body habitus  **Cervix positioned to patient's right- long speculum used  Extremities: no edema, no calf tenderness bilaterally  Chaperone: Levy Pupa    Assessment & Plan:  1) PMB -Reviewed potential causes from atrophy to endometrial carcinoma -discussed work up including pelvic US and/or endometrial biopsy -pt would prefer to hold off on biopsy until [place] completed.  She is aware that if endometrial lining is thickened she will need to return to the office for next step -Questions and concerns were addressed  2) Breast discharge -should this continue would advise prolactin check -plan for mammogram in Oct -no immediate treatment/management indicated   Orders Placed This Encounter  Procedures   US PELVIC COMPLETE WITH TRANSVAGINAL   POCT urine pregnancy    Return for please schedule pelvic US at hospital.   Janyth Pupa, DO Attending [place], [place] for [place], [place]

## 2021-07-06 ENCOUNTER — Other Ambulatory Visit: Payer: Self-pay | Admitting: Gastroenterology

## 2021-07-06 DIAGNOSIS — K219 Gastro-esophageal reflux disease without esophagitis: Secondary | ICD-10-CM

## 2021-07-07 ENCOUNTER — Ambulatory Visit (HOSPITAL_COMMUNITY)
Admission: RE | Admit: 2021-07-07 | Discharge: 2021-07-07 | Disposition: A | Discharge status: Home / Self Care | Payer: Medicare HMO | Source: Ambulatory Visit | Attending: Obstetrics & Gynecology | Admitting: Obstetrics & Gynecology

## 2021-07-07 DIAGNOSIS — N950 Postmenopausal bleeding: Secondary | ICD-10-CM | POA: Insufficient documentation

## 2021-07-07 NOTE — Telephone Encounter (Signed)
Noted  

## 2021-07-11 ENCOUNTER — Ambulatory Visit (HOSPITAL_COMMUNITY)
Admission: RE | Admit: 2021-07-11 | Discharge: 2021-07-11 | Disposition: A | Discharge status: Home / Self Care | Payer: Medicare HMO | Source: Ambulatory Visit | Attending: Nurse Practitioner | Admitting: Nurse Practitioner

## 2021-07-11 DIAGNOSIS — R110 Nausea: Secondary | ICD-10-CM | POA: Insufficient documentation

## 2021-07-11 DIAGNOSIS — R109 Unspecified abdominal pain: Secondary | ICD-10-CM | POA: Diagnosis present

## 2021-07-11 MED ORDER — IOHEXOL 300 MG/ML  SOLN
100.0000 mL | Freq: Once | INTRAMUSCULAR | Status: AC | PRN
  Administered 2021-07-11: 100 mL via INTRAVENOUS

## 2021-07-15 LAB — POCT I-STAT CREATININE: Creatinine, Ser: 0.9 mg/dL (ref 0.44–1.00)

## 2021-07-23 ENCOUNTER — Ambulatory Visit (INDEPENDENT_AMBULATORY_CARE_PROVIDER_SITE_OTHER): Payer: Medicare HMO | Admitting: Obstetrics & Gynecology

## 2021-07-23 ENCOUNTER — Other Ambulatory Visit (HOSPITAL_COMMUNITY)
Admission: RE | Admit: 2021-07-23 | Discharge: 2021-07-23 | Disposition: A | Discharge status: Home / Self Care | Payer: Medicare HMO | Source: Ambulatory Visit | Attending: Obstetrics & Gynecology | Admitting: Obstetrics & Gynecology

## 2021-07-23 ENCOUNTER — Encounter: Payer: Self-pay | Admitting: Obstetrics & Gynecology

## 2021-07-23 VITALS — BP 148/90 | HR 90 | Ht 67.0 in | Wt 282.0 lb

## 2021-07-23 DIAGNOSIS — N950 Postmenopausal bleeding: Secondary | ICD-10-CM | POA: Diagnosis not present

## 2021-07-23 DIAGNOSIS — R9389 Abnormal findings on diagnostic imaging of other specified body structures: Secondary | ICD-10-CM | POA: Insufficient documentation

## 2021-07-23 NOTE — Progress Notes (Signed)
GYN VISIT Patient name: Autumn [name] MRN [id number]  Date of birth: May 29, 1965 Chief Complaint:   Gynecologic Exam (Endometrial biopsy)  History of Present Illness:   Autumn [name] is a 56 y.o. [phone] female being seen today for follow up regarding postmenopausal bleeding.  In review, in June she had reported an episode of BRB that lasted for about 2 days in April.  She has not had any bleeding since that time.  [place] completed- 14cm uterus with 17m endometrium.  Two fibroid- 3.9cm and 4.2cm in size.  Ovaries not seen  Patient's last menstrual period was 11/11/2016.     07/01/2021   11:12 AM 03/14/2019   10:24 AM 11/19/2016   10:26 AM  Depression screen PHQ 2/9  Decreased Interest 0 0 0  Down, Depressed, Hopeless '1 1 1  '$ PHQ - 2 Score '1 1 1  '$ Altered sleeping 1    Tired, decreased energy 1    Change in appetite 1    Feeling bad or failure about yourself  0    Trouble concentrating 0    Moving slowly or fidgety/restless 1    Suicidal thoughts 0    PHQ-9 Score 5       Review of Systems:   Pertinent items are noted in HPI Denies fever/chills, dizziness, headaches, visual disturbances, fatigue, shortness of breath, chest pain, abdominal pain, vomiting, denies problems with bowel movements, urination, or intercourse unless otherwise stated above.  Pertinent History Reviewed:  Reviewed past medical,surgical, social, obstetrical and family history.  Reviewed problem list, medications and allergies. Physical Assessment:   Vitals:   07/23/21 1538  BP: (!) 148/90  Pulse: 90  Weight: 282 lb (127.9 kg)  Height: '5\' 7"'$  (1.702 m)  Body mass index is 44.17 kg/m.       Physical Examination:   General appearance: alert, well appearing, and in no distress  Psych: mood appropriate, normal affect  Skin: warm & dry   Cardiovascular: normal heart rate noted  Respiratory: normal respiratory effort, no distress  Abdomen: soft, non-tender   Pelvic: normal external  genitalia, vulva, vagina, cervix, uterus and adnexa  Extremities: no edema   Chaperone: ACelene Squibb   Endometrial Biopsy Procedure Note  Pre-operative Diagnosis: postmenopausal bleeding  Post-operative Diagnosis: same  Procedure Details  The risks (including infection, bleeding, pain, and uterine perforation) and benefits of the procedure were explained to the patient and Written informed consent was obtained.  Antibiotic prophylaxis against endocarditis was not indicated.   The patient was placed in the dorsal lithotomy position.  Bimanual exam showed the uterus to be in the neutral position.  A speculum inserted in the vagina, and the cervix prepped with betadine.     A single tooth tenaculum was applied to the anterior lip of the cervix for stabilization.  A Pipelle endometrial aspirator was used to sample the endometrium.  Sample was sent for pathologic examination.  Condition: Stable  Complications: None  Assessment/Plan PMB, Thickened endometrium  -Reviewed [place]ings and discussed plan for next step -inform consent obtained, procedure completed as above -Next step pending results of pathology If benign findings, f/u in 173yror annual The patient was advised to call for any fever or for prolonged or severe pain or bleeding. She was advised to use OTC analgesics as needed for mild to moderate pain. She was advised to avoid vaginal intercourse for 48 hours or until the bleeding has completely stopped.   JeJanyth PupaDO Attending Obstetrician & Gynecologist,  Product/process development scientist for [employer], [place]

## 2021-07-25 LAB — SURGICAL PATHOLOGY

## 2021-08-11 ENCOUNTER — Other Ambulatory Visit: Payer: Self-pay

## 2021-08-11 ENCOUNTER — Encounter (HOSPITAL_COMMUNITY): Payer: Self-pay | Admitting: Emergency Medicine

## 2021-08-11 ENCOUNTER — Emergency Department (HOSPITAL_COMMUNITY)
Admission: EM | Admit: 2021-08-11 | Discharge: 2021-08-11 | Disposition: A | Discharge status: Home / Self Care | Payer: Medicare HMO | Attending: Emergency Medicine | Admitting: Emergency Medicine

## 2021-08-11 DIAGNOSIS — Z79899 Other long term (current) drug therapy: Secondary | ICD-10-CM | POA: Diagnosis not present

## 2021-08-11 DIAGNOSIS — R310 Gross hematuria: Secondary | ICD-10-CM | POA: Insufficient documentation

## 2021-08-11 DIAGNOSIS — I10 Essential (primary) hypertension: Secondary | ICD-10-CM | POA: Insufficient documentation

## 2021-08-11 DIAGNOSIS — E119 Type 2 diabetes mellitus without complications: Secondary | ICD-10-CM | POA: Diagnosis not present

## 2021-08-11 LAB — URINALYSIS, ROUTINE W REFLEX MICROSCOPIC
Bacteria, UA: NONE SEEN
Bilirubin Urine: NEGATIVE
Glucose, UA: NEGATIVE mg/dL
Ketones, ur: 5 mg/dL — AB
Leukocytes,Ua: NEGATIVE
Nitrite: NEGATIVE
Protein, ur: NEGATIVE mg/dL
RBC / HPF: >50 RBC/hpf — ABNORMAL HIGH (ref 0–5)
Specific Gravity, Urine: 1.028 (ref 1.005–1.030)
pH: 5 (ref 5.0–8.0)

## 2021-08-11 LAB — POC URINE PREG, ED: Preg Test, Ur: NEGATIVE

## 2021-08-11 MED ORDER — NITROFURANTOIN MONOHYD MACRO 100 MG PO CAPS: 100.0000 mg | ORAL_CAPSULE | Freq: Two times a day (BID) | ORAL | 0 refills | Status: DC

## 2021-08-11 NOTE — ED Triage Notes (Signed)
Pt with c/o dark red vaginal bleeding that started tonight. States not wearing a pad and not bleeding now. Just noticed it when she was wiping after using restroom. C/o lower abdominal pain as well.

## 2021-08-11 NOTE — Discharge Instructions (Signed)
Your urine does not show definite signs of infection, but that is the most common reason for there to be bleeding.  I have given you a prescription for an antibiotic.  Please take it as prescribed.  Please follow-up with your primary care provider to repeat a urinalysis once you have completed the course of antibiotics.  Your blood pressure was very high today.  Please make sure to take your blood pressure medication.  Please check your blood pressure at home.  It is continuing to stay very high, your doctor may need to adjust your blood pressure medication.  Blood pressure that is not adequately controlled can lead to heart attacks, strokes, kidney failure.

## 2021-08-11 NOTE — ED Provider Notes (Signed)
[facility] EMERGENCY DEPARTMENT Provider Note   CSN: [id number] Arrival date & time: 08/11/21  0158     History  Chief Complaint  Patient presents with   Vaginal Bleeding    Autumn [name] is a 56 y.o. female.  The history is provided by the patient.  Vaginal Bleeding She has history of diabetes, hypertension and is postmenopausal and comes in with vaginal bleeding tonight.  She had dysuria as well as a suprapubic pain and noted some blood when she wiped.  She has not had any actual flow from the vagina.  She denies nausea or vomiting and denies fever or chills.  She has been evaluated by gynecologist for postmenopausal bleeding with episode last April, but this is different.  She does endorse urinary urgency, frequency, and mild tenesmus.   Home Medications Prior to Admission medications   Medication Sig Start Date End Date Taking? Authorizing Provider  Cholecalciferol (VITAMIN D-3) 125 MCG (5000 UT) TABS Take 5,000 Units by mouth daily.    [provider]  labetalol (NORMODYNE) 200 MG tablet Take 200 mg by mouth 2 (two) times daily.    [provider]  LINZESS 290 MCG CAPS capsule TAKE 1 CAPSULE (290 MCG TOTAL) BY MOUTH DAILY BEFORE BREAKFAST. 09/12/20   Erenest Rasher, PA-C  meloxicam (MOBIC) 15 MG tablet Take 15 mg by mouth daily.    [provider]  metFORMIN (GLUCOPHAGE-XR) 500 MG 24 hr tablet Take 500 mg by mouth every morning. 05/25/21   [provider]  naproxen (NAPROSYN) 500 MG tablet Take 500 mg by mouth 2 (two) times a day.    [provider]  omeprazole (PRILOSEC) 20 MG capsule TAKE 1 CAPSULE (20 MG TOTAL) BY MOUTH 2 (TWO) TIMES DAILY BEFORE A MEAL. 07/07/21   Annitta Needs, NP  oxyCODONE (ROXICODONE) 5 MG immediate release tablet Take 1 tablet (5 mg total) by mouth every 4 (four) hours as needed for severe pain or breakthrough pain. 01/30/21   Virl Cagey, MD  pravastatin (PRAVACHOL) 40 MG tablet Take 40 mg by  mouth daily.    [provider]  traZODone (DESYREL) 50 MG tablet Take 50 mg by mouth at bedtime.    [provider]      Allergies    Bee venom, Tramadol, Asa [aspirin], Latex, and Peppermint flavor    Review of Systems   Review of Systems  Genitourinary:  Positive for vaginal bleeding.  All other systems reviewed and are negative.   Physical Exam Updated Vital Signs BP (!) 163/130   Pulse 87   Temp (!) 97.5 F (36.4 C)   Resp 20   Ht '5\' 7"'$  (1.702 m)   Wt 128 kg   LMP 11/11/2016   SpO2 100%   BMI 44.20 kg/m  Physical Exam Vitals and nursing note reviewed.   56 year old female, resting comfortably and in no acute distress. Vital signs are significant for elevated blood pressure. Oxygen saturation is 100%, which is normal. Head is normocephalic and atraumatic. PERRLA, EOMI. Oropharynx is clear. Neck is nontender and supple without adenopathy or JVD. Back is nontender and there is no CVA tenderness. Lungs are clear without rales, wheezes, or rhonchi. Chest is nontender. Heart has regular rate and rhythm without murmur. Abdomen is soft, flat, with mild suprapubic tenderness. Pelvic: Normal external female genitalia.  Limited speculum exam shows no blood in the vaginal vault. Extremities have no cyanosis or edema, full range of motion is present. Skin  is warm and dry without rash. Neurologic: Mental status is normal, cranial nerves are intact, moves all extremities equally.  ED Results / Procedures / Treatments   Labs (all labs ordered are listed, but only abnormal results are displayed) Labs Reviewed  URINALYSIS, ROUTINE W REFLEX MICROSCOPIC - Abnormal; Notable for the following components:      Result Value   Hgb urine dipstick MODERATE (*)    Ketones, ur 5 (*)    RBC / HPF >50 (*)    All other components within normal limits  POC URINE PREG, ED   Procedures Procedures    Medications Ordered in ED Medications - No data to display  ED  Course/ Medical Decision Making/ A&P                           Medical Decision Making Amount and/or Complexity of Data Reviewed Labs: ordered.  Risk Prescription drug management.   Bleeding following urination.  No evidence of uterine bleeding.  Old records are reviewed, and on 07/23/2021 she had an endometrial biopsy for postmenopausal bleeding, and pathology was benign.  Urinalysis is significant for greater than 50 RBCs but no WBCs or bacteria noted.  Hematuria will need to be worked up as an outpatient.  She is given a prescription for nitrofurantoin to treat possible infection.  She is referred back to her primary care provider to recheck urinalysis after completion of course of antibiotics.  Patient vies to have blood pressure rechecked as an outpatient.  She may need to have her blood pressure medication adjusted.  Final Clinical Impression(s) / ED Diagnoses Final diagnoses:  Gross hematuria  Elevated blood pressure reading with diagnosis of hypertension    Rx / DC Orders ED Discharge Orders          Ordered    nitrofurantoin, macrocrystal-monohydrate, (MACROBID) 100 MG capsule  2 times daily        08/11/21 2836              Delora Fuel, MD 62/94/76 [phone]

## 2021-08-24 NOTE — Progress Notes (Deleted)
Referring Provider: Leslie Andrea, MD Primary Care Physician:  Leslie Andrea, MD Primary GI Physician: Dr. Abbey Chatters  No chief complaint on file.   HPI:   Autumn [name] is a 56 y.o. female  with history of constipation, GERD, dysphagia, rectal bleeding due to internal hemorrhoids.  Colonoscopy up-to-date July 2020 with 3 hyperplastic polyps, external/internal hemorrhoids, tortuous left colon, due for repeat in 2030.  She is presenting today for follow-up.   Recent imaging/procedures to address dysphagia/sore throat:  CT neck 09/17/2020: Soft tissue density in the vallecula most likely reflect prominent lingual tonsils, prominent palatine tonsils which moderately narrows the oropharyngeal airway.   EGD 10/03/2020: Normal esophagus, Gastritis biopsied (nonspecific reactive gastropathy, negative for H. pylori), normal examined duodenum.  Recommended Prilosec 20 mg twice daily.   Thyroid ultrasound 11/22/2020: 1.4 cm left mid thyroid TR 4 nodule meets criteria for follow-up in 1 year.  No biopsy indicated.  Additional 2 nodules also noted.  Right neck palpable abnormality appears to correlate with mildly enlarged right submandibular lymph node favorable to be reactive.  Last seen in our office 11/28/2020. Reported 2 weeks of swelling on the right side of the neck with some soreness with swallowing, cough, sinus drainage, nasal congestion, feeling hoarse in the morning.  Some shortness of breath with walking, but no fever or body aches.  GERD is well controlled on omeprazole 20 mg twice daily, no dysphagia.  Constipation well controlled with Linzess 290 mcg daily. No alarm symptoms.  Some bloating in the morning related to eating late at night.  On exam, she had slightly enlarged and tender submandibular lymph node.  Recommended follow-up with PCP and Dr. Benjamine Mola on sore throat.  Otherwise, continue current medications, eat 4-6 small meals daily, and no eating within 3 hours of going to bed.   Follow-up in 6 months.   Today:   GERD:   Constipation:  Past Medical History:  Diagnosis Date   Anxiety    Panic attacks- "cries"   Arthritis    Complication of anesthesia    had some hallucations   Diabetes mellitus without complication ([place])    Fibroids, submucosal 12/02/2016   GERD (gastroesophageal reflux disease)    H/O colonoscopy    Hypertension    Rectal bleeding    in setting of hemorrhoids. Colonoscopy in July 2020   Shortness of breath dyspnea    with exertion   Thickened endometrium 12/02/2016    Past Surgical History:  Procedure Laterality Date   BALLOON DILATION N/A 10/03/2020   Procedure: BALLOON DILATION;  Surgeon: [name], DO;  [facility];  Service: Endoscopy;  Laterality: N/A;   BIOPSY  10/03/2020   Procedure: BIOPSY;  Surgeon: [name], DO;  [facility];  Service: Endoscopy;;   BREAST LUMPECTOMY Right    age 35's-benign   CESAREAN SECTION     x3   COLONOSCOPY     COLONOSCOPY WITH PROPOFOL N/A 08/23/2018   PROPOFOL;  Surgeon: Danie Binder, MD; three 2-4 mm polyps in the rectum and sigmoid colon, external and internal hemorrhoids, tortuous left colon.  Pathology with 3 hyperplastic polyps.  Next colonoscopy in 2030.   DENTAL SURGERY     ESOPHAGOGASTRODUODENOSCOPY (EGD) WITH PROPOFOL N/A 10/03/2020   Surgeon: [name], DO; Normal esophagus, Gastritis biopsied (nonspecific reactive gastropathy, negative for H. pylori), normal examined duodenum.   MASS EXCISION N/A 12/06/2017   Procedure: EXCISION 1CM CYST ON FACE;  Surgeon: Virl Cagey,  MD;  Location: [facility];  Service: General;  Laterality: N/A;   MULTIPLE EXTRACTIONS WITH ALVEOLOPLASTY Bilateral 04/12/2015   Procedure: BILATERAL MULTIPLE EXTRACTIONS NUMBERS TWENTY, TWENTY ONE, TWENTY TWO, TWENTY THREE, TWENTY FOUR, TWENTY FIVE, TWENTY EIGHT, TWENTY NINE WITH ALVEOLOPLASTY;  Surgeon: Diona Browner, DDS;  Location: [place];  Service: Oral Surgery;   Laterality: Bilateral;   POLYPECTOMY  08/23/2018   Procedure: POLYPECTOMY;  Surgeon: Danie Binder, MD;  [facility];  Service: Endoscopy;;   TUBAL LIGATION      Current Outpatient Medications  Medication Sig Dispense Refill   Cholecalciferol (VITAMIN D-3) 125 MCG (5000 UT) TABS Take 5,000 Units by mouth daily.     labetalol (NORMODYNE) 200 MG tablet Take 200 mg by mouth 2 (two) times daily.     LINZESS 290 MCG CAPS capsule TAKE 1 CAPSULE (290 MCG TOTAL) BY MOUTH DAILY BEFORE BREAKFAST. 90 capsule 3   meloxicam (MOBIC) 15 MG tablet Take 15 mg by mouth daily.     metFORMIN (GLUCOPHAGE-XR) 500 MG 24 hr tablet Take 500 mg by mouth every morning.     naproxen (NAPROSYN) 500 MG tablet Take 500 mg by mouth 2 (two) times a day.     nitrofurantoin, macrocrystal-monohydrate, (MACROBID) 100 MG capsule Take 1 capsule (100 mg total) by mouth 2 (two) times daily. X 7 days 14 capsule 0   omeprazole (PRILOSEC) 20 MG capsule TAKE 1 CAPSULE (20 MG TOTAL) BY MOUTH 2 (TWO) TIMES DAILY BEFORE A MEAL. 180 capsule 1   oxyCODONE (ROXICODONE) 5 MG immediate release tablet Take 1 tablet (5 mg total) by mouth every 4 (four) hours as needed for severe pain or breakthrough pain. 10 tablet 0   pravastatin (PRAVACHOL) 40 MG tablet Take 40 mg by mouth daily.     traZODone (DESYREL) 50 MG tablet Take 50 mg by mouth at bedtime.     No current facility-administered medications for this visit.    Allergies as of 08/25/2021 - Review Complete 08/11/2021  Allergen Reaction Noted   Bee venom Anaphylaxis 11/04/2010   Tramadol Hives 11/04/2010   Asa [aspirin] Other (See Comments) 08/14/2015   Latex Itching 12/26/2018   Peppermint flavor Other (See Comments) 03/01/2018    Family History  Adopted: Yes  Problem Relation Age of Onset   Diabetes Daughter    Heart disease Other    Colon cancer Neg Hx     Social History   Socioeconomic History   Marital status: Significant Other    Spouse name: Not on  file   Number of children: Not on file   Years of education: Not on file   Highest education level: Not on file  Occupational History   Not on file  Tobacco Use   Smoking status: Every Day    Packs/day: 1.00    Years: 31.00    Total pack years: 31.00    Types: Cigarettes   Smokeless tobacco: Never  Vaping Use   Vaping Use: Never used  Substance and Sexual Activity   Alcohol use: Yes    Comment: occ   Drug use: No   Sexual activity: Yes    Birth control/protection: Surgical    Comment: tubal  Other Topics Concern   Not on file  Social History Narrative   Not on file   Social Determinants of Health   Financial Resource Strain: Low Risk  (07/01/2021)   Overall Financial Resource Strain (CARDIA)    Difficulty of Paying Living Expenses: Not hard at all  Food Insecurity: No Food Insecurity (07/01/2021)   Hunger Vital Sign    Worried About Running Out of Food in the Last Year: Never true    Ran Out of Food in the Last Year: Never true  Transportation Needs: Unmet Transportation Needs (07/01/2021)   PRAPARE - Transportation    Lack of Transportation (Medical): Yes    Lack of Transportation (Non-Medical): Yes  Physical Activity: Sufficiently Active (07/01/2021)   Exercise Vital Sign    Days of Exercise per Week: 7 days    Minutes of Exercise per Session: 30 min  Stress: No Stress Concern Present (07/01/2021)   [place]    Feeling of Stress : Not at all  Social Connections: Moderately Integrated (07/01/2021)   Social Connection and Isolation Panel [NHANES]    Frequency of Communication with Friends and Family: More than three times a week    Frequency of Social Gatherings with Friends and Family: More than three times a week    Attends Religious Services: More than 4 times per year    Active Member of [employer] or Organizations: No    Attends Archivist Meetings: Never    Marital Status: Living with partner     Review of Systems: Gen: Denies fever, chills, cold or flulike symptoms, presyncope, syncope. CV: Denies chest pain, palpitations. Resp: Denies dyspnea, cough. GI: See HPI Heme: See HPI  Physical Exam: LMP 11/11/2016  General:   Alert and oriented. No distress noted. Pleasant and cooperative.  Head:  Normocephalic and atraumatic. Eyes:  Conjuctiva clear without scleral icterus. Heart:  S1, S2 present without murmurs appreciated. Lungs:  Clear to auscultation bilaterally. No wheezes, rales, or rhonchi. No distress.  Abdomen:  +BS, soft, non-tender and non-distended. No rebound or guarding. No HSM or masses noted. Msk:  Symmetrical without gross deformities. Normal posture. Extremities:  Without edema. Neurologic:  Alert and  oriented x4 Psych:  Normal mood and affect.    Assessment:  56 year old female with history of constipation, GERD, dysphagia, rectal bleeding due to internal hemorrhoids, presenting today for follow-up.  GERD:   Constipation:   Plan:  ***   Aliene Altes, PA-C [facility] 08/25/2021

## 2021-08-25 ENCOUNTER — Ambulatory Visit: Payer: Medicare HMO | Admitting: Gastroenterology

## 2021-09-16 NOTE — Progress Notes (Unsigned)
Referring Provider: Leslie Andrea, MD Primary Care Physician:  Leslie Andrea, MD Primary GI Physician: Dr. Abbey Chatters  No chief complaint on file.   HPI:   Autumn [name] is a 56 y.o. female with history of constipation, GERD, dysphagia, rectal bleeding due to internal hemorrhoids.  Colonoscopy up-to-date July 2020 with 3 hyperplastic polyps, external/internal hemorrhoids, tortuous left colon, due for repeat in 2030.  She is presenting today for follow-up.   Imaging/procedures to address dysphagia/sore throat:  CT neck 09/17/2020: Soft tissue density in the vallecula most likely reflect prominent lingual tonsils, prominent palatine tonsils which moderately narrows the oropharyngeal airway.   EGD 10/03/2020: Normal esophagus, Gastritis biopsied (nonspecific reactive gastropathy, negative for H. pylori), normal examined duodenum.  Recommended Prilosec 20 mg twice daily.  Thyroid ultrasound 11/22/2020: 1.4 cm left mid thyroid TR 4 nodule meets criteria for follow-up in 1 year.  No biopsy indicated.  Additional 2 nodules also noted.  Right neck palpable abnormality appears to correlate with mildly enlarged right submandibular lymph node favorable to be reactive.  Last seen in our office 11/28/2020. Reported 2 weeks of swelling on the right side of the neck with some soreness with swallowing, cough, sinus drainage, nasal congestion, feeling hoarse in the morning.  Some shortness of breath with walking, but no fever or body aches.  GERD is well controlled on omeprazole 20 mg twice daily. No dysphagia.   Some morning bloating if eating close to bedtime. On exam, she had slightly enlarged and tender submandibular lymph node.  Recommended follow-up with PCP and Dr. Benjamine Mola on sore throat.  Otherwise, continue current medications, eat 4-6 small meals daily, and no eating within 3 hours of going to bed.  Follow-up in 6 months.  Today:   Past Medical History:  Diagnosis Date   Anxiety    Panic  attacks- "cries"   Arthritis    Complication of anesthesia    had some hallucations   Diabetes mellitus without complication ([place])    Fibroids, submucosal 12/02/2016   GERD (gastroesophageal reflux disease)    H/O colonoscopy    Hypertension    Rectal bleeding    in setting of hemorrhoids. Colonoscopy in July 2020   Shortness of breath dyspnea    with exertion   Thickened endometrium 12/02/2016    Past Surgical History:  Procedure Laterality Date   BALLOON DILATION N/A 10/03/2020   Procedure: BALLOON DILATION;  Surgeon: [name], DO;  [facility];  Service: Endoscopy;  Laterality: N/A;   BIOPSY  10/03/2020   Procedure: BIOPSY;  Surgeon: [name], DO;  [facility];  Service: Endoscopy;;   BREAST LUMPECTOMY Right    age 37's-benign   CESAREAN SECTION     x3   COLONOSCOPY     COLONOSCOPY WITH PROPOFOL N/A 08/23/2018   PROPOFOL;  Surgeon: Danie Binder, MD; three 2-4 mm polyps in the rectum and sigmoid colon, external and internal hemorrhoids, tortuous left colon.  Pathology with 3 hyperplastic polyps.  Next colonoscopy in 2030.   DENTAL SURGERY     ESOPHAGOGASTRODUODENOSCOPY (EGD) WITH PROPOFOL N/A 10/03/2020   Surgeon: [name], DO; Normal esophagus, Gastritis biopsied (nonspecific reactive gastropathy, negative for H. pylori), normal examined duodenum.   MASS EXCISION N/A 12/06/2017   Procedure: EXCISION 1CM CYST ON FACE;  Surgeon: Virl Cagey, MD;  Location: [facility];  Service: General;  Laterality: N/A;   MULTIPLE EXTRACTIONS WITH ALVEOLOPLASTY Bilateral 04/12/2015   Procedure: BILATERAL MULTIPLE  EXTRACTIONS NUMBERS TWENTY, TWENTY ONE, TWENTY TWO, TWENTY THREE, TWENTY FOUR, TWENTY FIVE, TWENTY EIGHT, TWENTY NINE WITH ALVEOLOPLASTY;  Surgeon: Diona Browner, DDS;  Location: [place];  Service: Oral Surgery;  Laterality: Bilateral;   POLYPECTOMY  08/23/2018   Procedure: POLYPECTOMY;  Surgeon: Danie Binder, MD;  [facility];  Service: Endoscopy;;   TUBAL LIGATION      Current Outpatient Medications  Medication Sig Dispense Refill   Cholecalciferol (VITAMIN D-3) 125 MCG (5000 UT) TABS Take 5,000 Units by mouth daily.     labetalol (NORMODYNE) 200 MG tablet Take 200 mg by mouth 2 (two) times daily.     LINZESS 290 MCG CAPS capsule TAKE 1 CAPSULE (290 MCG TOTAL) BY MOUTH DAILY BEFORE BREAKFAST. 90 capsule 3   meloxicam (MOBIC) 15 MG tablet Take 15 mg by mouth daily.     metFORMIN (GLUCOPHAGE-XR) 500 MG 24 hr tablet Take 500 mg by mouth every morning.     naproxen (NAPROSYN) 500 MG tablet Take 500 mg by mouth 2 (two) times a day.     nitrofurantoin, macrocrystal-monohydrate, (MACROBID) 100 MG capsule Take 1 capsule (100 mg total) by mouth 2 (two) times daily. X 7 days 14 capsule 0   omeprazole (PRILOSEC) 20 MG capsule TAKE 1 CAPSULE (20 MG TOTAL) BY MOUTH 2 (TWO) TIMES DAILY BEFORE A MEAL. 180 capsule 1   oxyCODONE (ROXICODONE) 5 MG immediate release tablet Take 1 tablet (5 mg total) by mouth every 4 (four) hours as needed for severe pain or breakthrough pain. 10 tablet 0   pravastatin (PRAVACHOL) 40 MG tablet Take 40 mg by mouth daily.     traZODone (DESYREL) 50 MG tablet Take 50 mg by mouth at bedtime.     No current facility-administered medications for this visit.    Allergies as of 09/18/2021 - Review Complete 08/11/2021  Allergen Reaction Noted   Bee venom Anaphylaxis 11/04/2010   Tramadol Hives 11/04/2010   Asa [aspirin] Other (See Comments) 08/14/2015   Latex Itching 12/26/2018   Peppermint flavor Other (See Comments) 03/01/2018    Family History  Adopted: Yes  Problem Relation Age of Onset   Diabetes Daughter    Heart disease Other    Colon cancer Neg Hx     Social History   Socioeconomic History   Marital status: Significant Other    Spouse name: Not on file   Number of children: Not on file   Years of education: Not on file   Highest education level: Not on file   Occupational History   Not on file  Tobacco Use   Smoking status: Every Day    Packs/day: 1.00    Years: 31.00    Total pack years: 31.00    Types: Cigarettes   Smokeless tobacco: Never  Vaping Use   Vaping Use: Never used  Substance and Sexual Activity   Alcohol use: Yes    Comment: occ   Drug use: No   Sexual activity: Yes    Birth control/protection: Surgical    Comment: tubal  Other Topics Concern   Not on file  Social History Narrative   Not on file   Social Determinants of Health   Financial Resource Strain: Low Risk  (07/01/2021)   Overall Financial Resource Strain (CARDIA)    Difficulty of Paying Living Expenses: Not hard at all  Food Insecurity: No Food Insecurity (07/01/2021)   Hunger Vital Sign    Worried About Running Out of Food in the Last  Year: Never true    [place] in the Last Year: Never true  Transportation Needs: Unmet Transportation Needs (07/01/2021)   PRAPARE - Transportation    Lack of Transportation (Medical): Yes    Lack of Transportation (Non-Medical): Yes  Physical Activity: Sufficiently Active (07/01/2021)   Exercise Vital Sign    Days of Exercise per Week: 7 days    Minutes of Exercise per Session: 30 min  Stress: No Stress Concern Present (07/01/2021)   [place]    Feeling of Stress : Not at all  Social Connections: Moderately Integrated (07/01/2021)   Social Connection and Isolation Panel [NHANES]    Frequency of Communication with Friends and Family: More than three times a week    Frequency of Social Gatherings with Friends and Family: More than three times a week    Attends Religious Services: More than 4 times per year    Active Member of [employer] or Organizations: No    Attends Archivist Meetings: Never    Marital Status: Living with partner    Review of Systems: Gen: Denies fever, chills, cold or flu like symptoms, pre-syncope, or syncope.  CV: Denies  chest pain, palpitations. Resp: Denies dyspnea, cough.  GI: See HPI  Heme: See HPI  Physical Exam: LMP 11/11/2016  General:   Alert and oriented. No distress noted. Pleasant and cooperative.  Head:  Normocephalic and atraumatic. Eyes:  Conjuctiva clear without scleral icterus. Heart:  S1, S2 present without murmurs appreciated. Lungs:  Clear to auscultation bilaterally. No wheezes, rales, or rhonchi. No distress.  Abdomen:  +BS, soft, non-tender and non-distended. No rebound or guarding. No HSM or masses noted. Msk:  Symmetrical without gross deformities. Normal posture. Extremities:  Without edema. Neurologic:  Alert and  oriented x4 Psych:  Normal mood and affect.    Assessment:     Plan:  ***   Aliene Altes, PA-C [facility] 09/18/2021

## 2021-09-18 ENCOUNTER — Ambulatory Visit (INDEPENDENT_AMBULATORY_CARE_PROVIDER_SITE_OTHER): Payer: Medicare HMO | Admitting: Gastroenterology

## 2021-09-18 ENCOUNTER — Encounter: Payer: Self-pay | Admitting: Gastroenterology

## 2021-09-18 VITALS — BP 142/84 | HR 82 | Temp 97.9°F | Ht 65.0 in | Wt 279.8 lb

## 2021-09-18 DIAGNOSIS — K219 Gastro-esophageal reflux disease without esophagitis: Secondary | ICD-10-CM | POA: Diagnosis not present

## 2021-09-18 DIAGNOSIS — K5900 Constipation, unspecified: Secondary | ICD-10-CM

## 2021-09-18 NOTE — Patient Instructions (Signed)
Continue omeprazole 20 mg twice daily 30 minutes before breakfast and dinner.  Continue Linzess 290 mcg daily.  We will follow-up with you in 1 year or sooner if needed.  It was great to see you again today!  I am glad you are doing well!  Aliene Altes, PA-C [facility]

## 2021-10-06 ENCOUNTER — Other Ambulatory Visit: Payer: Self-pay | Admitting: Gastroenterology

## 2021-10-06 DIAGNOSIS — K5900 Constipation, unspecified: Secondary | ICD-10-CM

## 2021-12-26 ENCOUNTER — Other Ambulatory Visit (HOSPITAL_COMMUNITY): Payer: Self-pay | Admitting: Nurse Practitioner

## 2021-12-26 DIAGNOSIS — Z1231 Encounter for screening mammogram for malignant neoplasm of breast: Secondary | ICD-10-CM

## 2022-01-07 ENCOUNTER — Ambulatory Visit (HOSPITAL_COMMUNITY)
Admission: RE | Admit: 2022-01-07 | Discharge: 2022-01-07 | Disposition: A | Discharge status: Home / Self Care | Payer: Medicare HMO | Source: Ambulatory Visit | Attending: Nurse Practitioner | Admitting: Nurse Practitioner

## 2022-01-07 DIAGNOSIS — Z1231 Encounter for screening mammogram for malignant neoplasm of breast: Secondary | ICD-10-CM | POA: Insufficient documentation

## 2022-03-01 ENCOUNTER — Other Ambulatory Visit: Payer: Self-pay | Admitting: Gastroenterology

## 2022-03-01 DIAGNOSIS — K219 Gastro-esophageal reflux disease without esophagitis: Secondary | ICD-10-CM

## 2022-05-13 ENCOUNTER — Encounter: Payer: Self-pay | Admitting: Orthopedic Surgery

## 2022-05-13 ENCOUNTER — Ambulatory Visit (INDEPENDENT_AMBULATORY_CARE_PROVIDER_SITE_OTHER): Payer: Medicare HMO | Admitting: Orthopedic Surgery

## 2022-05-13 VITALS — BP 131/82 | HR 93

## 2022-05-13 DIAGNOSIS — M25562 Pain in left knee: Secondary | ICD-10-CM

## 2022-05-13 MED ORDER — CYCLOBENZAPRINE HCL 10 MG PO TABS: 10.0000 mg | ORAL_TABLET | Freq: Two times a day (BID) | ORAL | 0 refills | Status: DC | PRN

## 2022-05-13 NOTE — Progress Notes (Signed)
New Patient Visit  Assessment: Autumn [name] is a 57 y.o. female with the following: 1. Acute pain of left knee  Plan: Autumn [name] has acute onset pain in her left knee.  She does have chronic knee pain.  She recently fell.  Pain did not immediately get worse, over the subsequent days pain and swelling got a lot worse.  She was seen in an urgent care center, and continues to have pain.  Has difficulty bearing weight.  On physical exam, she has diffuse swelling.  There is no bruising.  It is difficult to get an accurate exam.  She states that she has tried 1 pill of prednisone, and this made her very sleepy.  As such, I discussed proceeding with some Flexeril, as well as an antiinflammatory medication.  Continue to use a brace.  Range of motion as needed.  Weightbearing as tolerated.  I will see him in 2 weeks, for repeat evaluation.  Follow-up: Return in about 2 weeks (around 05/27/2022).  Subjective:  Chief Complaint  Patient presents with   New Patient (Initial Visit)   Knee Pain    LT knee pain Seen at [facility] Has been painful 30+ years and pain is increasing    History of Present Illness: Autumn [name] is a 57 y.o. female who presents for evaluation of left knee pain.  She states she has had pain in her left knee for many years.  Recently, she fell.  She did not have immediate pain.  However, over the subsequent days, she started develop a lot of pain and swelling of the left knee.  She was seen in urgent care center.  Radiographs were negative for acute injury.  She may have some degenerative changes on x-ray.  She continues to have difficulty ambulating.  She is been wearing a brace.  He is taking some ibuprofen.  She is icing her knee.  Limited improvement in her pain.  She has difficulty bending her knee.   Review of Systems: No fevers or chills No numbness or tingling No chest pain No shortness of breath No bowel or bladder dysfunction No GI  distress No headaches   Medical History:  Past Medical History:  Diagnosis Date   Anxiety    Panic attacks- "cries"   Arthritis    Complication of anesthesia    had some hallucations   Diabetes mellitus without complication    Fibroids, submucosal 12/02/2016   GERD (gastroesophageal reflux disease)    H/O colonoscopy    Hypertension    Rectal bleeding    in setting of hemorrhoids. Colonoscopy in July 2020   Shortness of breath dyspnea    with exertion   Thickened endometrium 12/02/2016    Past Surgical History:  Procedure Laterality Date   BALLOON DILATION N/A 10/03/2020   Procedure: BALLOON DILATION;  Surgeon: Lanelle Bal, DO;  [facility]: [facility];  Service: Endoscopy;  Laterality: N/A;   BIOPSY  10/03/2020   Procedure: BIOPSY;  Surgeon: Lanelle Bal, DO;  [facility]: [facility];  Service: Endoscopy;;   BREAST LUMPECTOMY Right    age 20's-benign   CESAREAN SECTION     x3   COLONOSCOPY     COLONOSCOPY WITH PROPOFOL N/A 08/23/2018   PROPOFOL;  Surgeon: West Bali, MD; three 2-4 mm polyps in the rectum and sigmoid colon, external and internal hemorrhoids, tortuous left colon.  Pathology with 3 hyperplastic polyps.  Next colonoscopy in 2030.   DENTAL SURGERY  ESOPHAGOGASTRODUODENOSCOPY (EGD) WITH PROPOFOL N/A 10/03/2020   Surgeon: Lanelle Bal, DO; Normal esophagus, Gastritis biopsied (nonspecific reactive gastropathy, negative for H. pylori), normal examined duodenum.   MASS EXCISION N/A 12/06/2017   Procedure: EXCISION 1CM CYST ON FACE;  Surgeon: Lucretia Roers, MD;  Location: [facility];  Service: General;  Laterality: N/A;   MULTIPLE EXTRACTIONS WITH ALVEOLOPLASTY Bilateral 04/12/2015   Procedure: BILATERAL MULTIPLE EXTRACTIONS NUMBERS TWENTY, TWENTY ONE, TWENTY TWO, TWENTY THREE, TWENTY FOUR, TWENTY FIVE, TWENTY EIGHT, TWENTY NINE WITH ALVEOLOPLASTY;  Surgeon: Ocie Doyne, DDS;  Location: [facility];  Service: Oral Surgery;  Laterality:  Bilateral;   POLYPECTOMY  08/23/2018   Procedure: POLYPECTOMY;  Surgeon: West Bali, MD;  Location: [facility];  Service: Endoscopy;;   TUBAL LIGATION      Family History  Adopted: Yes  Problem Relation Age of Onset   Diabetes Daughter    Heart disease Other    Colon cancer Neg Hx    Social History   Tobacco Use   Smoking status: Every Day    Packs/day: 1.00    Years: 31.00    Additional pack years: 0.00    Total pack years: 31.00    Types: Cigarettes   Smokeless tobacco: Never  Vaping Use   Vaping Use: Never used  Substance Use Topics   Alcohol use: Yes    Comment: occ   Drug use: No    Allergies  Allergen Reactions   Bee Venom Anaphylaxis   Tramadol Hives   Asa [Aspirin] Other (See Comments)    Nose bleed   Latex Itching   Peppermint Flavor Other (See Comments)    Does not like taste    Current Meds  Medication Sig   ACCU-CHEK GUIDE test strip USE TO CHECK BLOOD SUGAR UP TO TWICE DAILY   Accu-Chek Softclix Lancets lancets SMARTSIG:Topical 1-2 Times Daily   Cholecalciferol (VITAMIN D-3) 125 MCG (5000 UT) TABS Take 5,000 Units by mouth daily.   cyclobenzaprine (FLEXERIL) 10 MG tablet Take 1 tablet (10 mg total) by mouth 2 (two) times daily as needed.   DULoxetine (CYMBALTA) 30 MG capsule Take 30 mg by mouth 2 (two) times daily.   labetalol (NORMODYNE) 200 MG tablet Take 200 mg by mouth 2 (two) times daily.   LINZESS 290 MCG CAPS capsule TAKE 1 CAPSULE (290 MCG TOTAL) BY MOUTH DAILY BEFORE BREAKFAST.   LORazepam (ATIVAN) 0.5 MG tablet Take 0.5 mg by mouth at bedtime.   meloxicam (MOBIC) 15 MG tablet Take 15 mg by mouth daily.   metFORMIN (GLUCOPHAGE-XR) 500 MG 24 hr tablet Take 500 mg by mouth every morning.   methocarbamol (ROBAXIN) 750 MG tablet Take 750 mg by mouth every 4 (four) hours as needed for muscle spasms.   naproxen (NAPROSYN) 500 MG tablet Take 500 mg by mouth 2 (two) times a day.   nitrofurantoin, macrocrystal-monohydrate, (MACROBID) 100 MG  capsule Take 1 capsule (100 mg total) by mouth 2 (two) times daily. X 7 days   omeprazole (PRILOSEC) 20 MG capsule TAKE 1 CAPSULE (20 MG TOTAL) BY MOUTH 2 (TWO) TIMES DAILY BEFORE A MEAL.   pravastatin (PRAVACHOL) 40 MG tablet Take 40 mg by mouth daily.   traZODone (DESYREL) 50 MG tablet Take 50 mg by mouth at bedtime.    Objective: BP 131/82   Pulse 93   LMP 11/11/2016   Physical Exam:  General: Alert and oriented. and No acute distress. Gait: Unable to ambulate.  Evaluation of left knee demonstrates  a moderate effusion.  She has diffuse tenderness to palpation.  There is no bruising.  No redness.  Patellar tendon is palpable.  Quadriceps tendon is palpable.  She can achieve full extension.  Difficulty with flexion beyond 45 degrees.  Significant guarding with stability testing.  Equivocal Lachman.  IMAGING: I personally reviewed images previously obtained from the ED  Level images of the left knee were available.  Possible send mild medial compartment narrowing.   New Medications:  Meds ordered this encounter  Medications   cyclobenzaprine (FLEXERIL) 10 MG tablet    Sig: Take 1 tablet (10 mg total) by mouth 2 (two) times daily as needed.    Dispense:  20 tablet    Refill:  0      Oliver Barre, MD  05/13/2022 4:27 PM

## 2022-05-13 NOTE — Patient Instructions (Signed)
Ice the knee to help with swelling  Ibuprofen or naproxen, follow directions on the bottle  Ok to work on range of motion  Ok to bear weight on your left leg

## 2022-05-19 ENCOUNTER — Other Ambulatory Visit (HOSPITAL_COMMUNITY): Payer: Self-pay | Admitting: Nurse Practitioner

## 2022-05-19 DIAGNOSIS — M25562 Pain in left knee: Secondary | ICD-10-CM

## 2022-05-19 DIAGNOSIS — M25462 Effusion, left knee: Secondary | ICD-10-CM

## 2022-05-27 ENCOUNTER — Ambulatory Visit: Payer: Medicare HMO | Admitting: Orthopedic Surgery

## 2022-06-09 ENCOUNTER — Encounter (HOSPITAL_COMMUNITY): Payer: Self-pay

## 2022-06-09 ENCOUNTER — Ambulatory Visit (HOSPITAL_COMMUNITY): Payer: Medicare HMO

## 2022-06-12 ENCOUNTER — Encounter: Payer: Self-pay | Admitting: Family Medicine

## 2022-06-12 ENCOUNTER — Ambulatory Visit (INDEPENDENT_AMBULATORY_CARE_PROVIDER_SITE_OTHER): Payer: Medicare HMO | Admitting: Family Medicine

## 2022-06-12 VITALS — BP 135/86 | HR 90 | Ht 64.0 in | Wt 263.0 lb

## 2022-06-12 DIAGNOSIS — I10 Essential (primary) hypertension: Secondary | ICD-10-CM | POA: Diagnosis not present

## 2022-06-12 DIAGNOSIS — Z131 Encounter for screening for diabetes mellitus: Secondary | ICD-10-CM

## 2022-06-12 DIAGNOSIS — Z114 Encounter for screening for human immunodeficiency virus [HIV]: Secondary | ICD-10-CM

## 2022-06-12 DIAGNOSIS — R0602 Shortness of breath: Secondary | ICD-10-CM

## 2022-06-12 DIAGNOSIS — E559 Vitamin D deficiency, unspecified: Secondary | ICD-10-CM

## 2022-06-12 DIAGNOSIS — Z1159 Encounter for screening for other viral diseases: Secondary | ICD-10-CM

## 2022-06-12 DIAGNOSIS — Z13220 Encounter for screening for lipoid disorders: Secondary | ICD-10-CM

## 2022-06-12 DIAGNOSIS — K219 Gastro-esophageal reflux disease without esophagitis: Secondary | ICD-10-CM | POA: Diagnosis not present

## 2022-06-12 DIAGNOSIS — Z122 Encounter for screening for malignant neoplasm of respiratory organs: Secondary | ICD-10-CM

## 2022-06-12 DIAGNOSIS — Z1329 Encounter for screening for other suspected endocrine disorder: Secondary | ICD-10-CM

## 2022-06-12 MED ORDER — VITAMIN D3 125 MCG (5000 UT) PO CAPS: 5000.0000 [IU] | ORAL_CAPSULE | Freq: Every day | ORAL | 1 refills | Status: DC

## 2022-06-12 MED ORDER — OMEPRAZOLE 20 MG PO CPDR: 20.0000 mg | DELAYED_RELEASE_CAPSULE | Freq: Two times a day (BID) | ORAL | 2 refills | Status: DC

## 2022-06-12 MED ORDER — ALBUTEROL SULFATE HFA 108 (90 BASE) MCG/ACT IN AERS: 2.0000 | INHALATION_SPRAY | Freq: Four times a day (QID) | RESPIRATORY_TRACT | 2 refills | Status: DC | PRN

## 2022-06-12 MED ORDER — METFORMIN HCL ER 500 MG PO TB24: 500.0000 mg | ORAL_TABLET | Freq: Every day | ORAL | 1 refills | Status: DC

## 2022-06-12 MED ORDER — PRAVASTATIN SODIUM 40 MG PO TABS: 40.0000 mg | ORAL_TABLET | Freq: Every day | ORAL | 1 refills | Status: DC

## 2022-06-12 MED ORDER — TRAZODONE HCL 50 MG PO TABS: 50.0000 mg | ORAL_TABLET | Freq: Every day | ORAL | 3 refills | Status: DC

## 2022-06-12 MED ORDER — LABETALOL HCL 200 MG PO TABS: 200.0000 mg | ORAL_TABLET | Freq: Two times a day (BID) | ORAL | 3 refills | Status: DC

## 2022-06-12 NOTE — Patient Instructions (Signed)

## 2022-06-12 NOTE — Assessment & Plan Note (Signed)
Vitals:   06/12/22 0836 06/12/22 1046  BP: 137/86 135/86  Patient reported on Labetalol 200 mg twice daily Refilled patient medications in today's visit since she's been out for one week. Explained non pharmacological interventions such as low salt, DASH diet discussed. Educated on stress reduction and physical activity minimum 150 minutes per week. Discussed signs and symptoms of major cardiovascular event and need to present to the ED.  Patient verbalizes understanding regarding plan of care and all questions answered.

## 2022-06-12 NOTE — Progress Notes (Signed)
New Patient Office Visit   Subjective   Patient ID: Autumn [name], female    DOB: Dec 15, 1965  Age: 57 y.o. MRN: [id number]  CC:  Chief Complaint  Patient presents with   Establish Care    HPI Autumn [name] 57 year old female, presents to establish care. 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  Hypertension This is a chronic problem.Blood pressure has been gradually improving since onset. Associated symptoms include palpitations. Pertinent negatives include no blurred vision, chest pain, headaches or peripheral edema. Risk factors for coronary artery disease include dyslipidemia, obesity and smoking/tobacco exposure. Past treatments include beta blockers. The current treatment provides moderate improvement. Compliance problems include diet.         Outpatient Encounter Medications as of 06/12/2022  Medication Sig   ACCU-CHEK GUIDE test strip USE TO CHECK BLOOD SUGAR UP TO TWICE DAILY   Accu-Chek Softclix Lancets lancets SMARTSIG:Topical 1-2 Times Daily   albuterol (VENTOLIN HFA) 108 (90 Base) MCG/ACT inhaler Inhale 2 puffs into the lungs every 6 (six) hours as needed for wheezing or shortness of breath.   Cholecalciferol (VITAMIN D3) 125 MCG (5000 UT) CAPS Take 1 capsule (5,000 Units total) by mouth daily.   LINZESS 290 MCG CAPS capsule TAKE 1 CAPSULE (290 MCG TOTAL) BY MOUTH DAILY BEFORE BREAKFAST.   meloxicam (MOBIC) 15 MG tablet Take 15 mg by mouth daily.   [DISCONTINUED] labetalol (NORMODYNE) 200 MG tablet Take 200 mg by mouth 2 (two) times daily.   [DISCONTINUED] metFORMIN (GLUCOPHAGE-XR) 500 MG 24 hr tablet Take 500 mg by mouth every morning.   [DISCONTINUED] omeprazole (PRILOSEC) 20  MG capsule TAKE 1 CAPSULE (20 MG TOTAL) BY MOUTH 2 (TWO) TIMES DAILY BEFORE A MEAL.   [DISCONTINUED] pravastatin (PRAVACHOL) 40 MG tablet Take 40 mg by mouth daily.   [DISCONTINUED] traZODone (DESYREL) 50 MG tablet Take 50 mg by mouth at bedtime.   cyclobenzaprine (FLEXERIL) 10 MG tablet Take 1 tablet (10 mg total) by mouth 2 (two) times daily as needed.   labetalol (NORMODYNE) 200 MG tablet Take 1 tablet (200 mg total) by mouth 2 (two) times daily.   LORazepam (ATIVAN) 0.5 MG tablet Take 0.5 mg by mouth at bedtime.   meclizine (ANTIVERT) 25 MG tablet SMARTSIG:1 Tablet(s) By Mouth 1-3 Times Daily   metFORMIN (GLUCOPHAGE-XR) 500 MG 24 hr tablet Take 1 tablet (500 mg total) by mouth daily with breakfast.   methocarbamol (ROBAXIN) 750 MG tablet Take 750 mg by mouth every 4 (four) hours as needed for muscle spasms.   naproxen (NAPROSYN) 500 MG tablet Take 500 mg by mouth 2 (two) times a day.   nitrofurantoin, macrocrystal-monohydrate, (MACROBID) 100 MG capsule Take 1 capsule (100 mg total) by mouth 2 (two) times daily. X 7 days   omeprazole (PRILOSEC) 20 MG capsule Take 1 capsule (20 mg total) by mouth 2 (two) times daily before a meal.   pravastatin (PRAVACHOL) 40 MG tablet Take 1 tablet (40 mg total) by mouth daily.   predniSONE (DELTASONE) 10 MG tablet Take 10 mg by mouth daily.   traZODone (DESYREL) 50 MG tablet Take 1 tablet (50 mg total) by mouth at bedtime.   [DISCONTINUED] Cholecalciferol (VITAMIN D-3) 125 MCG (5000 UT) TABS Take 5,000 Units by mouth daily.   [DISCONTINUED] DULoxetine (CYMBALTA) 30 MG capsule Take  30 mg by mouth 2 (two) times daily.   No facility-administered encounter medications on file as of 06/12/2022.    Past Surgical History:  Procedure Laterality Date   BALLOON DILATION N/A 10/03/2020   Procedure: BALLOON DILATION;  Surgeon: Lanelle Bal, DO;  [facility];  Service: Endoscopy;  Laterality: N/A;   BIOPSY  10/03/2020   Procedure: BIOPSY;  Surgeon:  Lanelle Bal, DO;  [facility];  Service: Endoscopy;;   BREAST LUMPECTOMY Right    age 60's-benign   CESAREAN SECTION     x3   COLONOSCOPY     COLONOSCOPY WITH PROPOFOL N/A 08/23/2018   PROPOFOL;  Surgeon: West Bali, MD; three 2-4 mm polyps in the rectum and sigmoid colon, external and internal hemorrhoids, tortuous left colon.  Pathology with 3 hyperplastic polyps.  Next colonoscopy in 2030.   DENTAL SURGERY     ESOPHAGOGASTRODUODENOSCOPY (EGD) WITH PROPOFOL N/A 10/03/2020   Surgeon: Lanelle Bal, DO; Normal esophagus, Gastritis biopsied (nonspecific reactive gastropathy, negative for H. pylori), normal examined duodenum.   MASS EXCISION N/A 12/06/2017   Procedure: EXCISION 1CM CYST ON FACE;  Surgeon: Lucretia Roers, MD;  Location: [facility];  Service: General;  Laterality: N/A;   MULTIPLE EXTRACTIONS WITH ALVEOLOPLASTY Bilateral 04/12/2015   Procedure: BILATERAL MULTIPLE EXTRACTIONS NUMBERS TWENTY, TWENTY ONE, TWENTY TWO, TWENTY THREE, TWENTY FOUR, TWENTY FIVE, TWENTY EIGHT, TWENTY NINE WITH ALVEOLOPLASTY;  Surgeon: Ocie Doyne, DDS;  Location: [facility];  Service: Oral Surgery;  Laterality: Bilateral;   POLYPECTOMY  08/23/2018   Procedure: POLYPECTOMY;  Surgeon: West Bali, MD;  [facility]: [facility];  Service: Endoscopy;;   TUBAL LIGATION      Review of Systems  Constitutional:  Negative for chills and fever.  Respiratory:  Negative for cough.   Cardiovascular:  Negative for leg swelling.  Gastrointestinal:  Negative for abdominal pain, nausea and vomiting.  Genitourinary:  Negative for dysuria.  Skin:  Negative for rash.  Neurological:  Negative for dizziness.      Objective    BP 135/86   Pulse 90   Ht 5\' 4"  (1.626 m)   Wt 263 lb (119.3 kg)   LMP 11/11/2016   SpO2 98%   BMI 45.14 kg/m   Physical Exam Vitals reviewed.  Constitutional:      General: She is not in acute distress.    Appearance: Normal appearance. She is not  ill-appearing, toxic-appearing or diaphoretic.  HENT:     Head: Normocephalic.     Right Ear: Tympanic membrane normal.     Left Ear: Tympanic membrane normal.     Nose: Nose normal.     Mouth/Throat:     Mouth: Mucous membranes are moist.  Eyes:     General:        Right eye: No discharge.        Left eye: No discharge.     Extraocular Movements: Extraocular movements intact.     Conjunctiva/sclera: Conjunctivae normal.     Pupils: Pupils are equal, round, and reactive to light.  Cardiovascular:     Rate and Rhythm: Normal rate.     Pulses: Normal pulses.     Heart sounds: Normal heart sounds.  Pulmonary:     Effort: Pulmonary effort is normal. No respiratory distress.     Breath sounds: Normal breath sounds.  Abdominal:     General: Bowel sounds are normal.     Palpations: Abdomen is soft.  Tenderness: There is no abdominal tenderness. There is no right CVA tenderness, left CVA tenderness or guarding.  Musculoskeletal:        General: Normal range of motion.     Cervical back: Normal range of motion.  Skin:    General: Skin is warm and dry.     Capillary Refill: Capillary refill takes less than 2 seconds.  Neurological:     General: No focal deficit present.     Mental Status: She is alert and oriented to person, place, and time.     Coordination: Coordination normal.     Gait: Gait normal.  Psychiatric:        Mood and Affect: Mood normal.        Behavior: Behavior normal.       Assessment & Plan:  Vitamin D deficiency -     VITAMIN D 25 Hydroxy (Vit-D Deficiency, Fractures)  Screening for lung cancer -     Ambulatory Referral for Lung Cancer Scre  Need for hepatitis C screening test -     Hepatitis C antibody  Screening for HIV (human immunodeficiency virus) -     HIV Antibody (routine testing w rflx)  Screening for thyroid disorder -     TSH + free T4  Primary hypertension Assessment & Plan: Vitals:   06/12/22 0836 06/12/22 1046  BP: 137/86  135/86  Patient reported on Labetalol 200 mg twice daily Refilled patient medications in today's visit since she's been out for one week. Explained non pharmacological interventions such as low salt, DASH diet discussed. Educated on stress reduction and physical activity minimum 150 minutes per week. Discussed signs and symptoms of major cardiovascular event and need to present to the ED.  Patient verbalizes understanding regarding plan of care and all questions answered.   Orders: -     CBC with Differential/Platelet -     CMP14+EGFR -     Microalbumin / creatinine urine ratio  Screening for diabetes mellitus -     Hemoglobin A1c  Screening for lipid disorders -     Lipid panel  Gastroesophageal reflux disease, unspecified whether esophagitis present -     Omeprazole; Take 1 capsule (20 mg total) by mouth 2 (two) times daily before a meal.  Dispense: 180 capsule; Refill: 2  SOB (shortness of breath) on exertion -     Albuterol Sulfate HFA; Inhale 2 puffs into the lungs every 6 (six) hours as needed for wheezing or shortness of breath.  Dispense: 8 g; Refill: 2  Other orders -     Labetalol HCl; Take 1 tablet (200 mg total) by mouth 2 (two) times daily.  Dispense: 90 tablet; Refill: 3 -     metFORMIN HCl ER; Take 1 tablet (500 mg total) by mouth daily with breakfast.  Dispense: 90 tablet; Refill: 1 -     traZODone HCl; Take 1 tablet (50 mg total) by mouth at bedtime.  Dispense: 30 tablet; Refill: 3 -     Pravastatin Sodium; Take 1 tablet (40 mg total) by mouth daily.  Dispense: 90 tablet; Refill: 1 -     Vitamin D3; Take 1 capsule (5,000 Units total) by mouth daily.  Dispense: 90 capsule; Refill: 1    Return in about 1 month (around 07/13/2022) for Pap smear, palpitations EKG.   Cruzita Lederer Newman Nip, FNP

## 2022-06-14 LAB — TSH+FREE T4
Free T4: 1.39 ng/dL (ref 0.82–1.77)
TSH: 1.39 u[IU]/mL (ref 0.450–4.500)

## 2022-06-14 LAB — CMP14+EGFR
ALT: 23 IU/L (ref 0–32)
AST: 24 IU/L (ref 0–40)
Albumin/Globulin Ratio: 1.4 (ref 1.2–2.2)
Albumin: 4.1 g/dL (ref 3.8–4.9)
Alkaline Phosphatase: 121 IU/L (ref 44–121)
BUN/Creatinine Ratio: 11 (ref 9–23)
BUN: 11 mg/dL (ref 6–24)
Bilirubin Total: 0.3 mg/dL (ref 0.0–1.2)
CO2: 21 mmol/L (ref 20–29)
Calcium: 10 mg/dL (ref 8.7–10.2)
Chloride: 102 mmol/L (ref 96–106)
Creatinine, Ser: 0.98 mg/dL (ref 0.57–1.00)
Globulin, Total: 3 g/dL (ref 1.5–4.5)
Glucose: 147 mg/dL — ABNORMAL HIGH (ref 70–99)
Potassium: 4.8 mmol/L (ref 3.5–5.2)
Sodium: 138 mmol/L (ref 134–144)
Total Protein: 7.1 g/dL (ref 6.0–8.5)
eGFR: 69 mL/min/{1.73_m2} (ref >59)

## 2022-06-14 LAB — CBC WITH DIFFERENTIAL/PLATELET
Basophils Absolute: 0.1 10*3/uL (ref 0.0–0.2)
Basos: 1 %
EOS (ABSOLUTE): 0.2 10*3/uL (ref 0.0–0.4)
Eos: 3 %
Hematocrit: 37.2 % (ref 34.0–46.6)
Hemoglobin: 11.8 g/dL (ref 11.1–15.9)
Immature Grans (Abs): 0 10*3/uL (ref 0.0–0.1)
Immature Granulocytes: 0 %
Lymphocytes Absolute: 3.4 10*3/uL — ABNORMAL HIGH (ref 0.7–3.1)
Lymphs: 46 %
MCH: 22.3 pg — ABNORMAL LOW (ref 26.6–33.0)
MCHC: 31.7 g/dL (ref 31.5–35.7)
MCV: 70 fL — ABNORMAL LOW (ref 79–97)
Monocytes Absolute: 0.7 10*3/uL (ref 0.1–0.9)
Monocytes: 9 %
Neutrophils Absolute: 3.1 10*3/uL (ref 1.4–7.0)
Neutrophils: 41 %
Platelets: 466 10*3/uL — ABNORMAL HIGH (ref 150–450)
RBC: 5.3 x10E6/uL — ABNORMAL HIGH (ref 3.77–5.28)
RDW: 13.4 % (ref 11.7–15.4)
WBC: 7.5 10*3/uL (ref 3.4–10.8)

## 2022-06-14 LAB — HEMOGLOBIN A1C
Est. average glucose Bld gHb Est-mCnc: 157 mg/dL
Hgb A1c MFr Bld: 7.1 % — ABNORMAL HIGH (ref 4.8–5.6)

## 2022-06-14 LAB — MICROALBUMIN / CREATININE URINE RATIO
Creatinine, Urine: 194 mg/dL
Microalb/Creat Ratio: <2 mg/g creat (ref 0–29)
Microalbumin, Urine: <3 ug/mL

## 2022-06-14 LAB — LIPID PANEL
Chol/HDL Ratio: 5.4 ratio — ABNORMAL HIGH (ref 0.0–4.4)
Cholesterol, Total: 247 mg/dL — ABNORMAL HIGH (ref 100–199)
HDL: 46 mg/dL (ref >39)
LDL Chol Calc (NIH): 176 mg/dL — ABNORMAL HIGH (ref 0–99)
Triglycerides: 139 mg/dL (ref 0–149)
VLDL Cholesterol Cal: 25 mg/dL (ref 5–40)

## 2022-06-14 LAB — HIV ANTIBODY (ROUTINE TESTING W REFLEX): HIV Screen 4th Generation wRfx: NONREACTIVE

## 2022-06-14 LAB — VITAMIN D 25 HYDROXY (VIT D DEFICIENCY, FRACTURES): Vit D, 25-Hydroxy: 103 ng/mL — ABNORMAL HIGH (ref 30.0–100.0)

## 2022-06-14 LAB — HEPATITIS C ANTIBODY: Hep C Virus Ab: NONREACTIVE

## 2022-06-15 ENCOUNTER — Ambulatory Visit: Payer: Medicare HMO | Admitting: Internal Medicine

## 2022-06-25 ENCOUNTER — Other Ambulatory Visit: Payer: Self-pay | Admitting: Family Medicine

## 2022-06-25 MED ORDER — PRAVASTATIN SODIUM 80 MG PO TABS: 80.0000 mg | ORAL_TABLET | Freq: Every day | ORAL | 3 refills | Status: DC

## 2022-06-25 MED ORDER — METFORMIN HCL 1000 MG PO TABS: 1000.0000 mg | ORAL_TABLET | Freq: Every day | ORAL | 3 refills | Status: DC

## 2022-06-25 NOTE — Progress Notes (Signed)
Please inform patient,  Hemoglobin A1c 7.1 indicates type 2 diabetes Plan of treatment will include Metformin 1000 mg daily with breakfast. It is important to follow a DASH diet which includes vegetables,fruits,whole grains, fat free or low fat diary,fish,poultry,beans,nuts and seeds,vegetable oils. Find an activity that you will enjoyandstart to be active at least 5 days a week for 30 minutes each day.   CBC panel low, may indicate iron deficiency anemia. I advise to consume iron rich foods such as Red meat, pork, poultry, seafood, beans, dark green leafy vegetables, such as spinach, dried fruit, such as raisins and apricots. Iron-fortified cereals, and peas. I encourage to take over the counter iron tablets 65 mg once daily.  You can improve low MCH and MCV by increasing your dietary intake of foods high in iron, folate, and vitamins A and C.   Cholesterol levels elevated, start lifestyle modifications follow diet low in saturated fat, reduce dietary salt intake, avoid fatty foods. Increased Pravastatin  80 mg daily.  Medications sent to pharmacy.  Follow up with me on 07/20/2022

## 2022-07-06 ENCOUNTER — Ambulatory Visit (HOSPITAL_COMMUNITY): Payer: Medicare HMO

## 2022-07-10 ENCOUNTER — Ambulatory Visit: Payer: Medicare HMO | Admitting: Orthopedic Surgery

## 2022-07-13 ENCOUNTER — Telehealth: Payer: Self-pay

## 2022-07-13 ENCOUNTER — Other Ambulatory Visit: Payer: Self-pay | Admitting: *Deleted

## 2022-07-13 DIAGNOSIS — F17210 Nicotine dependence, cigarettes, uncomplicated: Secondary | ICD-10-CM

## 2022-07-13 DIAGNOSIS — Z122 Encounter for screening for malignant neoplasm of respiratory organs: Secondary | ICD-10-CM

## 2022-07-13 DIAGNOSIS — Z87891 Personal history of nicotine dependence: Secondary | ICD-10-CM

## 2022-07-13 NOTE — Telephone Encounter (Signed)
Patient called saying that she needs to get her MRI, but it was canceled due to machine messed up. Then she said something about her insurance denied it.  Please call her at [phone] to advise what she needs to do.

## 2022-07-14 NOTE — Telephone Encounter (Signed)
Left VM letting pt know that MRI was not ordered by our office so I wouldn't be able to assist with the process at this time. Suggested pt make a f/u to come back in for her concerns to see if a further work up could warrant an exam and get it approved if she is still having pain.

## 2022-07-14 NOTE — Telephone Encounter (Signed)
Patient called back wanting to have one of our doctors for her PCP since Dr. Sudie Bailey has retired.  I advised her we are a orthopedic doctor not a PCP and we can't do anything with the MRI as our office did not order it.   She said ok thank you.

## 2022-07-20 ENCOUNTER — Ambulatory Visit (HOSPITAL_COMMUNITY)
Admission: RE | Admit: 2022-07-20 | Discharge: 2022-07-20 | Disposition: A | Discharge status: Home / Self Care | Payer: Medicare HMO | Source: Ambulatory Visit | Attending: Family Medicine | Admitting: Family Medicine

## 2022-07-20 ENCOUNTER — Ambulatory Visit (INDEPENDENT_AMBULATORY_CARE_PROVIDER_SITE_OTHER): Payer: Medicare HMO | Admitting: Family Medicine

## 2022-07-20 DIAGNOSIS — L732 Hidradenitis suppurativa: Secondary | ICD-10-CM | POA: Diagnosis not present

## 2022-07-20 DIAGNOSIS — M25562 Pain in left knee: Secondary | ICD-10-CM | POA: Insufficient documentation

## 2022-07-20 DIAGNOSIS — G8929 Other chronic pain: Secondary | ICD-10-CM | POA: Insufficient documentation

## 2022-07-20 MED ORDER — CYCLOBENZAPRINE HCL 5 MG PO TABS
5.0000 mg | ORAL_TABLET | Freq: Three times a day (TID) | ORAL | 1 refills | Status: DC | PRN
Start: 2022-07-20 — End: 2022-10-16

## 2022-07-20 MED ORDER — DOXYCYCLINE HYCLATE 100 MG PO TABS
100.0000 mg | ORAL_TABLET | Freq: Two times a day (BID) | ORAL | 0 refills | Status: AC
Start: 2022-07-20 — End: 2022-07-30

## 2022-07-20 NOTE — Assessment & Plan Note (Signed)
Non- erythematous soft cyst noted on left buttock no pus or drainage noted Doxycyline 100 mg x 10 days Referral to dermatology for incision of cyst  Advise patient to apply cold , wet cloth or ice pack to the the affected area, wear loose-fitting , cotton clothing

## 2022-07-20 NOTE — Progress Notes (Signed)
Patient Office Visit   Subjective   Patient ID: Autumn [name], female    DOB: [birth year]  Age: 57 y.o. MRN: [id number]  CC:  Chief Complaint  Patient presents with   Follow-up    1 month f/u L-knee hurting for a week Autumn [name]/cyst on genital area    HPI Autumn [name] 57 year old female, presents to the the clinic for left knee pain and cyst on  left buttock. 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  Left Knee Pain  The incident occurred 3 months ago. The injury mechanism was a fall and a direct blow. The pain is present in the left knee. The quality of the pain is described as stabbing and shooting. The pain is at a severity of 10/10. The pain has been Fluctuating since onset. Associated symptoms include muscle weakness. Pertinent negatives include no inability to bear weight, loss of motion, loss of sensation, numbness or tingling. She reports no foreign bodies present. The symptoms are aggravated by movement and weight bearing. She has tried rest and heat for the symptoms. The treatment provided no relief.        Outpatient Encounter Medications as of 07/20/2022  Medication Sig   ACCU-CHEK GUIDE test strip USE TO CHECK BLOOD SUGAR UP TO TWICE DAILY   Accu-Chek Softclix Lancets lancets SMARTSIG:Topical 1-2 Times Daily   albuterol (VENTOLIN HFA) 108 (90 Base) MCG/ACT inhaler Inhale 2 puffs into the lungs every 6 (six) hours as needed for wheezing or shortness of breath.   Cholecalciferol (VITAMIN D3) 125 MCG (5000 UT) CAPS Take 1 capsule (5,000 Units total) by mouth daily.   cyclobenzaprine (FLEXERIL) 10 MG tablet Take 1 tablet (10 mg total) by mouth 2 (two) times daily as needed.    cyclobenzaprine (FLEXERIL) 5 MG tablet Take 1 tablet (5 mg total) by mouth 3 (three) times daily as needed for muscle spasms.   doxycycline (VIBRA-TABS) 100 MG tablet Take 1 tablet (100 mg total) by mouth 2 (two) times daily for 10 days.   labetalol (NORMODYNE) 200 MG tablet Take 1 tablet (200 mg total) by mouth 2 (two) times daily.   LINZESS 290 MCG CAPS capsule TAKE 1 CAPSULE (290 MCG TOTAL) BY MOUTH DAILY BEFORE BREAKFAST.   LORazepam (ATIVAN) 0.5 MG tablet Take 0.5 mg by mouth at bedtime.   meclizine (ANTIVERT) 25 MG tablet SMARTSIG:1 Tablet(s) By Mouth 1-3 Times Daily   meloxicam (MOBIC) 15 MG tablet Take 15 mg by mouth daily.   metFORMIN (GLUCOPHAGE) 1000 MG tablet Take 1 tablet (1,000 mg total) by mouth daily with breakfast.   methocarbamol (ROBAXIN) 750 MG tablet Take 750 mg by mouth every 4 (four) hours as needed for muscle spasms.   naproxen (NAPROSYN) 500 MG tablet Take 500 mg by mouth 2 (two) times a day.   nitrofurantoin, macrocrystal-monohydrate, (MACROBID) 100 MG capsule Take 1 capsule (100 mg total) by mouth 2 (two) times daily. X 7 days   omeprazole (PRILOSEC) 20 MG capsule Take 1 capsule (20 mg total) by mouth 2 (two) times daily before a meal.   pravastatin (PRAVACHOL) 80 MG tablet Take 1 tablet (80 mg total) by mouth daily.   predniSONE (DELTASONE) 10 MG tablet Take 10 mg by mouth daily.   traZODone (DESYREL) 50 MG tablet Take 1 tablet (50 mg total)  by mouth at bedtime.   No facility-administered encounter medications on file as of 07/20/2022.    Past Surgical History:  Procedure Laterality Date   BALLOON DILATION N/A 10/03/2020   Procedure: BALLOON DILATION;  Surgeon: Lanelle Bal, DO;  Location: [facility];  Service: Endoscopy;  Laterality: N/A;   BIOPSY  10/03/2020   Procedure: BIOPSY;  Surgeon: Lanelle Bal, DO;  Location: [facility];  Service: Endoscopy;;   BREAST LUMPECTOMY Right    age 38's-benign   CESAREAN SECTION     x3   COLONOSCOPY      COLONOSCOPY WITH PROPOFOL N/A 08/23/2018   PROPOFOL;  Surgeon: West Bali, MD; three 2-4 mm polyps in the rectum and sigmoid colon, external and internal hemorrhoids, tortuous left colon.  Pathology with 3 hyperplastic polyps.  Next colonoscopy in 2030.   DENTAL SURGERY     ESOPHAGOGASTRODUODENOSCOPY (EGD) WITH PROPOFOL N/A 10/03/2020   Surgeon: Lanelle Bal, DO; Normal esophagus, Gastritis biopsied (nonspecific reactive gastropathy, negative for H. pylori), normal examined duodenum.   MASS EXCISION N/A 12/06/2017   Procedure: EXCISION 1CM CYST ON FACE;  Surgeon: Lucretia Roers, MD;  Location: [facility];  Service: General;  Laterality: N/A;   MULTIPLE EXTRACTIONS WITH ALVEOLOPLASTY Bilateral 04/12/2015   Procedure: BILATERAL MULTIPLE EXTRACTIONS NUMBERS TWENTY, TWENTY ONE, TWENTY TWO, TWENTY THREE, TWENTY FOUR, TWENTY FIVE, TWENTY EIGHT, TWENTY NINE WITH ALVEOLOPLASTY;  Surgeon: Ocie Doyne, DDS;  Location: [facility];  Service: Oral Surgery;  Laterality: Bilateral;   POLYPECTOMY  08/23/2018   Procedure: POLYPECTOMY;  Surgeon: West Bali, MD;  Location: [facility];  Service: Endoscopy;;   TUBAL LIGATION      Review of Systems  Constitutional:  Negative for chills and fever.  Eyes:  Negative for blurred vision.  Respiratory:  Negative for shortness of breath.   Cardiovascular:  Negative for chest pain.  Gastrointestinal:  Negative for abdominal pain.  Genitourinary:  Negative for dysuria.  Musculoskeletal:  Positive for falls, joint pain and myalgias.  Neurological:  Negative for dizziness and headaches.      Objective    LMP 11/11/2016   Physical Exam Vitals reviewed.  Constitutional:      General: She is not in acute distress.    Appearance: Normal appearance. She is not ill-appearing, toxic-appearing or diaphoretic.  HENT:     Head: Normocephalic.  Eyes:     General:        Right eye: No discharge.        Left eye: No discharge.     Conjunctiva/sclera:  Conjunctivae normal.  Cardiovascular:     Rate and Rhythm: Normal rate.     Pulses: Normal pulses.     Heart sounds: Normal heart sounds.  Pulmonary:     Effort: Pulmonary effort is normal. No respiratory distress.     Breath sounds: Normal breath sounds.  Musculoskeletal:        General: Tenderness present.     Cervical back: Normal range of motion.     Right knee: Normal.     Left knee: Swelling present. Decreased range of motion. Tenderness present.  Skin:    General: Skin is warm and dry.     Capillary Refill: Capillary refill takes less than 2 seconds.  Neurological:     General: No focal deficit present.     Mental Status: She is alert and oriented to person, place, and time.     Coordination: Coordination normal.     Gait:  Gait normal.  Psychiatric:        Mood and Affect: Mood normal.        Behavior: Behavior normal.       Assessment & Plan:  Chronic pain of left knee Assessment & Plan: Xray ordered awaiting results will follow up Flexeril 5 mg PRN Explained to patient Non pharmacological interventions include the use of ice or heat, rest, recommend range of motion exercises, gentle stretching. The use of NSAIDs for pain management.  Follow up for worsening or persistent symptoms. Patient verbalizes understanding regarding plan of care and all questions answered.   Orders: -     DG Knee 1-2 Views Left; Future -     Cyclobenzaprine HCl; Take 1 tablet (5 mg total) by mouth 3 (three) times daily as needed for muscle spasms.  Dispense: 30 tablet; Refill: 1  Hidradenitis suppurativa of anus Assessment & Plan: Non- erythematous soft cyst noted on left buttock no pus or drainage noted Doxycyline 100 mg x 10 days Referral to dermatology for incision of cyst  Advise patient to apply cold , wet cloth or ice pack to the the affected area, wear loose-fitting , cotton clothing  Orders: -     Doxycycline Hyclate; Take 1 tablet (100 mg total) by mouth 2 (two) times daily for  10 days.  Dispense: 20 tablet; Refill: 0 -     Ambulatory referral to Dermatology    Return in about 3 months (around 10/20/2022) for chronic follow-up, routine labs.   Cruzita Lederer Newman Nip, FNP

## 2022-07-20 NOTE — Patient Instructions (Signed)

## 2022-07-20 NOTE — Assessment & Plan Note (Signed)
Xray ordered awaiting results will follow up Flexeril 5 mg PRN Explained to patient Non pharmacological interventions include the use of ice or heat, rest, recommend range of motion exercises, gentle stretching. The use of NSAIDs for pain management.  Follow up for worsening or persistent symptoms. Patient verbalizes understanding regarding plan of care and all questions answered.

## 2022-07-21 ENCOUNTER — Telehealth: Payer: Self-pay | Admitting: *Deleted

## 2022-07-21 NOTE — Telephone Encounter (Signed)
Left message for pt to call back to reschedule LDCT at a [facility] facility. Her insurance will not approve [facility].

## 2022-07-24 NOTE — Telephone Encounter (Signed)
Left another message for pt regarding changing CT location.

## 2022-07-29 ENCOUNTER — Ambulatory Visit (HOSPITAL_COMMUNITY): Payer: Medicare HMO

## 2022-07-29 ENCOUNTER — Encounter: Payer: Self-pay | Admitting: *Deleted

## 2022-07-29 NOTE — Telephone Encounter (Signed)
Letter mailed to pt letter her name that CT Location has been changed to [facility] at [facility].

## 2022-08-03 ENCOUNTER — Ambulatory Visit (INDEPENDENT_AMBULATORY_CARE_PROVIDER_SITE_OTHER): Payer: Medicare HMO

## 2022-08-03 VITALS — BP 135/86 | Ht 64.0 in | Wt 263.0 lb

## 2022-08-03 DIAGNOSIS — Z0000 Encounter for general adult medical examination without abnormal findings: Secondary | ICD-10-CM

## 2022-08-03 NOTE — Progress Notes (Signed)
Subjective:   Autumn [name] is a 57 y.o. female who presents for Medicare Annual (Subsequent) preventive examination.  Visit Complete: Virtual  I connected with  Hulda Humphrey on 08/03/22 by a audio enabled telemedicine application and verified that I am speaking with the correct person using two identifiers.  Patient Location: Home  Provider Location: Home Office  I discussed the limitations of evaluation and management by telemedicine. The patient expressed understanding and agreed to proceed.  Patient Medicare AWV questionnaire was completed by the patient on n/a; I have confirmed that all information answered by patient is correct and no changes since this date.  Review of Systems     Cardiac Risk Factors include: advanced age (>44men, >52 women);diabetes mellitus;dyslipidemia;hypertension;obesity (BMI >30kg/m2);sedentary lifestyle     Objective:    Today's Vitals   08/03/22 0910 08/03/22 0914  BP: 135/86   Weight: 263 lb (119.3 kg)   Height: 5\' 4"  (1.626 m)   PainSc:  7    Body mass index is 45.14 kg/m.     08/03/2022    9:10 AM 06/27/2021   11:26 AM 01/03/2021    3:45 AM 10/01/2020    8:48 AM 09/16/2020   10:15 AM 09/13/2020    1:57 PM 08/09/2019    5:07 PM  Advanced Directives  Does Patient Have a Medical Advance Directive? No No No Yes Yes Yes No  Type of Advance Directive    Living will Living will Living will   Does patient want to make changes to medical advance directive?    No - Patient declined  No - Patient declined   Would patient like information on creating a medical advance directive? No - Patient declined No - Patient declined No - Patient declined    No - Patient declined    Current Medications (verified) Outpatient Encounter Medications as of 08/03/2022  Medication Sig   ACCU-CHEK GUIDE test strip USE TO CHECK BLOOD SUGAR UP TO TWICE DAILY   Accu-Chek Softclix Lancets lancets SMARTSIG:Topical 1-2 Times Daily   albuterol (VENTOLIN HFA)  108 (90 Base) MCG/ACT inhaler Inhale 2 puffs into the lungs every 6 (six) hours as needed for wheezing or shortness of breath.   Cholecalciferol (VITAMIN D3) 125 MCG (5000 UT) CAPS Take 1 capsule (5,000 Units total) by mouth daily.   labetalol (NORMODYNE) 200 MG tablet Take 1 tablet (200 mg total) by mouth 2 (two) times daily.   LINZESS 290 MCG CAPS capsule TAKE 1 CAPSULE (290 MCG TOTAL) BY MOUTH DAILY BEFORE BREAKFAST.   LORazepam (ATIVAN) 0.5 MG tablet Take 0.5 mg by mouth at bedtime.   meclizine (ANTIVERT) 25 MG tablet SMARTSIG:1 Tablet(s) By Mouth 1-3 Times Daily   meloxicam (MOBIC) 15 MG tablet Take 15 mg by mouth daily.   metFORMIN (GLUCOPHAGE) 1000 MG tablet Take 1 tablet (1,000 mg total) by mouth daily with breakfast.   naproxen (NAPROSYN) 500 MG tablet Take 500 mg by mouth 2 (two) times a day.   omeprazole (PRILOSEC) 20 MG capsule Take 1 capsule (20 mg total) by mouth 2 (two) times daily before a meal.   pravastatin (PRAVACHOL) 80 MG tablet Take 1 tablet (80 mg total) by mouth daily.   traZODone (DESYREL) 50 MG tablet Take 1 tablet (50 mg total) by mouth at bedtime.   cyclobenzaprine (FLEXERIL) 10 MG tablet Take 1 tablet (10 mg total) by mouth 2 (two) times daily as needed. (Patient not taking: Reported on 08/03/2022)   cyclobenzaprine (FLEXERIL) 5 MG tablet Take  1 tablet (5 mg total) by mouth 3 (three) times daily as needed for muscle spasms. (Patient not taking: Reported on 08/03/2022)   methocarbamol (ROBAXIN) 750 MG tablet Take 750 mg by mouth every 4 (four) hours as needed for muscle spasms. (Patient not taking: Reported on 08/03/2022)   nitrofurantoin, macrocrystal-monohydrate, (MACROBID) 100 MG capsule Take 1 capsule (100 mg total) by mouth 2 (two) times daily. X 7 days (Patient not taking: Reported on 08/03/2022)   predniSONE (DELTASONE) 10 MG tablet Take 10 mg by mouth daily. (Patient not taking: Reported on 08/03/2022)   No facility-administered encounter medications on file as of  08/03/2022.    Allergies (verified) Bee venom, Tramadol, Asa [aspirin], Latex, and Peppermint flavor   History: Past Medical History:  Diagnosis Date   Anxiety    Panic attacks- "cries"   Arthritis    Complication of anesthesia    had some hallucations   Diabetes mellitus without complication (HCC)    Fibroids, submucosal 12/02/2016   GERD (gastroesophageal reflux disease)    H/O colonoscopy    Hypertension    Rectal bleeding    in setting of hemorrhoids. Colonoscopy in July 2020   Shortness of breath dyspnea    with exertion   Thickened endometrium 12/02/2016   Past Surgical History:  Procedure Laterality Date   BALLOON DILATION N/A 10/03/2020   Procedure: BALLOON DILATION;  Surgeon: Lanelle Bal, DO;  Location: [facility];  Service: Endoscopy;  Laterality: N/A;   BIOPSY  10/03/2020   Procedure: BIOPSY;  Surgeon: Lanelle Bal, DO;  Location: [facility];  Service: Endoscopy;;   BREAST LUMPECTOMY Right    age 53's-benign   CESAREAN SECTION     x3   COLONOSCOPY     COLONOSCOPY WITH PROPOFOL N/A 08/23/2018   PROPOFOL;  Surgeon: West Bali, MD; three 2-4 mm polyps in the rectum and sigmoid colon, external and internal hemorrhoids, tortuous left colon.  Pathology with 3 hyperplastic polyps.  Next colonoscopy in 2030.   DENTAL SURGERY     ESOPHAGOGASTRODUODENOSCOPY (EGD) WITH PROPOFOL N/A 10/03/2020   Surgeon: Lanelle Bal, DO; Normal esophagus, Gastritis biopsied (nonspecific reactive gastropathy, negative for H. pylori), normal examined duodenum.   MASS EXCISION N/A 12/06/2017   Procedure: EXCISION 1CM CYST ON FACE;  Surgeon: Lucretia Roers, MD;  Location: [facility];  Service: General;  Laterality: N/A;   MULTIPLE EXTRACTIONS WITH ALVEOLOPLASTY Bilateral 04/12/2015   Procedure: BILATERAL MULTIPLE EXTRACTIONS NUMBERS TWENTY, TWENTY ONE, TWENTY TWO, TWENTY THREE, TWENTY FOUR, TWENTY FIVE, TWENTY EIGHT, TWENTY NINE WITH ALVEOLOPLASTY;  Surgeon: Ocie Doyne, DDS;  Location: [facility];  Service: Oral Surgery;  Laterality: Bilateral;   POLYPECTOMY  08/23/2018   Procedure: POLYPECTOMY;  Surgeon: West Bali, MD;  Location: [facility];  Service: Endoscopy;;   TUBAL LIGATION     Family History  Adopted: Yes  Problem Relation Age of Onset   Diabetes Daughter    Heart disease Other    Colon cancer Neg Hx    Social History   Socioeconomic History   Marital status: Significant Other    Spouse name: Not on file   Number of children: Not on file   Years of education: Not on file   Highest education level: Not on file  Occupational History   Not on file  Tobacco Use   Smoking status: Every Day    Packs/day: 1.00    Years: 31.00    Additional pack years: 0.00  Total pack years: 31.00    Types: Cigarettes   Smokeless tobacco: Never  Vaping Use   Vaping Use: Never used  Substance and Sexual Activity   Alcohol use: Yes    Comment: occ   Drug use: No   Sexual activity: Yes    Birth control/protection: Surgical    Comment: tubal  Other Topics Concern   Not on file  Social History Narrative   Not on file   Social Determinants of Health   Financial Resource Strain: Low Risk  (08/03/2022)   Overall Financial Resource Strain (CARDIA)    Difficulty of Paying Living Expenses: Not hard at all  Food Insecurity: No Food Insecurity (08/03/2022)   Hunger Vital Sign    Worried About Running Out of Food in the Last Year: Never true    Ran Out of Food in the Last Year: Never true  Transportation Needs: No Transportation Needs (08/03/2022)   PRAPARE - Administrator, Civil Service (Medical): No    Lack of Transportation (Non-Medical): No  Physical Activity: Sufficiently Active (07/01/2021)   Exercise Vital Sign    Days of Exercise per Week: 7 days    Minutes of Exercise per Session: 30 min  Stress: No Stress Concern Present (08/03/2022)   [employer] of Occupational Health - Occupational Stress Questionnaire    Feeling  of Stress : Not at all  Social Connections: Moderately Integrated (08/03/2022)   Social Connection and Isolation Panel [NHANES]    Frequency of Communication with Friends and Family: More than three times a week    Frequency of Social Gatherings with Friends and Family: More than three times a week    Attends Religious Services: More than 4 times per year    Active Member of [employer] or Organizations: No    Attends Engineer, structural: Never    Marital Status: Married    Tobacco Counseling Ready to quit: Yes Counseling given: Yes   Clinical Intake:  Pre-visit preparation completed: Yes  Pain : 0-10 Pain Score: 7  Pain Type: Chronic pain Pain Location: Knee Pain Orientation: Left Pain Descriptors / Indicators: Shooting Pain Onset: More than a month ago Pain Frequency: Constant     BMI - recorded: 45.14 Nutritional Status: BMI > 30  Obese Nutritional Risks: None Diabetes: Yes CBG done?: No Did pt. bring in CBG monitor from home?: No  How often do you need to have someone help you when you read instructions, pamphlets, or other written materials from your doctor or pharmacy?: 1 - Never  Interpreter Needed?: No  Information entered by :: Abby Aydin Hink, CMA   Activities of Daily Living    08/03/2022    9:21 AM  In your present state of health, do you have any difficulty performing the following activities:  Hearing? 0  Vision? 0  Difficulty concentrating or making decisions? 0  Walking or climbing stairs? 1  Dressing or bathing? 0  Doing errands, shopping? 1  Comment always has someone with her  Preparing Food and eating ? Y  Comment has help  Using the Toilet? N  In the past six months, have you accidently leaked urine? N  Do you have problems with loss of bowel control? N  Managing your Medications? N  Managing your Finances? N  Housekeeping or managing your Housekeeping? Y  Comment has help    Patient Care Team: Del Newman Nip, Tenna Child, FNP as PCP  - General (Family Medicine) Lanelle Bal, DO as Consulting  Physician (Internal Medicine)  Indicate any recent Medical Services you may have received from other than [facility] providers in the past year (date may be approximate).     Assessment:   This is a routine wellness examination for [place].  Hearing/Vision screen Hearing Screening - Comments:: Patient denies any hearing difficulties.    Dietary issues and exercise activities discussed:     Goals Addressed             This Visit's Progress    Patient Stated       To remain as healthy and active as she currently is.        Depression Screen    08/03/2022    9:18 AM 07/20/2022    9:13 AM 06/12/2022    8:40 AM 07/01/2021   11:12 AM 03/14/2019   10:24 AM 11/19/2016   10:26 AM  PHQ 2/9 Scores  PHQ - 2 Score 1 0 4 1 1 1   PHQ- 9 Score 13 5 11 5       Fall Risk    08/03/2022    9:18 AM 07/20/2022    9:15 AM 06/12/2022    8:39 AM 07/01/2021   10:45 AM 08/15/2019   10:09 AM  Fall Risk   Falls in the past year? 1 1 1  0 0  Number falls in past yr: 1 1 1     Injury with Fall? 1 1 1     Risk for fall due to : History of fall(s);Impaired balance/gait;Orthopedic patient;Impaired mobility Impaired balance/gait     Follow up Education provided;Falls prevention discussed;Falls evaluation completed Falls evaluation completed;Education provided   Falls evaluation completed    MEDICARE RISK AT HOME:  Medicare Risk at Home - 08/03/22 0917     Any stairs in or around the home? No    If so, are there any without handrails? No    Home free of loose throw rugs in walkways, pet beds, electrical cords, etc? Yes    Adequate lighting in your home to reduce risk of falls? Yes    Life alert? No    Use of a cane, walker or w/c? Yes    Grab bars in the bathroom? No    Shower chair or bench in shower? No    Elevated toilet seat or a handicapped toilet? Yes             TIMED UP AND GO:  Was the test performed?  No    Cognitive  Function:        08/03/2022    9:18 AM  6CIT Screen  What Year? 0 points  What month? 0 points  What time? 0 points  Count back from 20 0 points  Months in reverse 0 points  Repeat phrase 0 points  Total Score 0 points    Immunizations Immunization History  Administered Date(s) Administered   Influenza,inj,Quad PF,6+ Mos 09/30/2018    TDAP status: Due, Education has been provided regarding the importance of this vaccine. Advised may receive this vaccine at local pharmacy or Health Dept. Aware to provide a copy of the vaccination record if obtained from local pharmacy or Health Dept. Verbalized acceptance and understanding.  Flu Vaccine status: Up to date  Pneumococcal vaccine status: NOT AGE APPROPRIATE FOR THIS PATIENT   Covid-19 vaccine status: Declined, Education has been provided regarding the importance of this vaccine but patient still declined. Advised may receive this vaccine at local pharmacy or Health Dept.or vaccine clinic. Aware to provide a copy of  the vaccination record if obtained from local pharmacy or Health Dept. Verbalized acceptance and understanding.  Qualifies for Shingles Vaccine? Yes   Zostavax completed No   Shingrix Completed?: No.    Education has been provided regarding the importance of this vaccine. Patient has been advised to call insurance company to determine out of pocket expense if they have not yet received this vaccine. Advised may also receive vaccine at local pharmacy or Health Dept. Verbalized acceptance and understanding.  Screening Tests Health Maintenance  Topic Date Due   Medicare Annual Wellness (AWV)  Never done   DTaP/Tdap/Td (1 - Tdap) Never done   Lung Cancer Screening  Never done   Zoster Vaccines- Shingrix (1 of 2) Never done   PAP SMEAR-Modifier  03/13/2022   COVID-19 Vaccine (1) 10/05/2022 (Originally 06/17/1966)   INFLUENZA VACCINE  08/27/2022   MAMMOGRAM  01/08/2024   Colonoscopy  08/22/2028   Hepatitis C Screening   Completed   HIV Screening  Completed   HPV VACCINES  Aged Out    Health Maintenance  Health Maintenance Due  Topic Date Due   Medicare Annual Wellness (AWV)  Never done   DTaP/Tdap/Td (1 - Tdap) Never done   Lung Cancer Screening  Never done   Zoster Vaccines- Shingrix (1 of 2) Never done   PAP SMEAR-Modifier  03/13/2022    Colorectal cancer screening: Type of screening: Colonoscopy. Completed 08/23/2018. Repeat every 10 years  Mammogram status: Completed 01/07/2022. Repeat every year  Bone Density Screening: NOT AGE APPROPRIATE FOR THIS PATIENT   Lung Cancer Screening: (Low Dose CT Chest recommended if Age 54-80 years, 20 pack-year currently smoking OR have quit w/in 15years.) does qualify.   Lung Cancer Screening Referral: Has appointment for screening on 08/20/2022  Additional Screening:  Hepatitis C Screening: does not qualify; Completed 06/12/2022  Vision Screening: Recommended annual ophthalmology exams for early detection of glaucoma and other disorders of the eye. Is the patient up to date with their annual eye exam?  No  Who is the provider or what is the name of the office in which the patient attends annual eye exams? [facility] [place] If pt is not established with a provider, would they like to be referred to a provider to establish care? No .   Dental Screening: Recommended annual dental exams for proper oral hygiene  Diabetic Foot Exam: n/a  Community Resource Referral / Chronic Care Management: CRR required this visit?  No   CCM required this visit?  No     Plan:     I have personally reviewed and noted the following in the patient's chart:   Medical and social history Use of alcohol, tobacco or illicit drugs  Current medications and supplements including opioid prescriptions. Patient is not currently taking opioid prescriptions. Functional ability and status Nutritional status Physical activity Advanced directives List of other  physicians Hospitalizations, surgeries, and ER visits in previous 12 months Vitals Screenings to include cognitive, depression, and falls Referrals and appointments  In addition, I have reviewed and discussed with patient certain preventive protocols, quality metrics, and best practice recommendations. A written personalized care plan for preventive services as well as general preventive health recommendations were provided to patient.   Any medications not marked as taking were not mentioned by the patient (or their caregiver if applicable) when reconciling the medications.  Because this visit was a virtual/telehealth visit,  certain criteria was not obtained, such a blood pressure, CBG if patient is a diabetic,  and timed up and go.     Jordan Hawks Kaitlin Ardito, CMA   08/03/2022   After Visit Summary: (Mail) Due to this being a telephonic visit, the after visit summary with patients personalized plan was offered to patient via mail   Nurse Notes:

## 2022-08-03 NOTE — Patient Instructions (Signed)
Autumn [name] , Thank you for taking time to come for your Medicare Wellness Visit. I appreciate your ongoing commitment to your health goals. Please review the following plan we discussed and let me know if I can assist you in the future.   These are the goals we discussed:  Goals      Patient Stated     To remain as healthy and active as she currently is.         This is a list of the screening recommended for you and due dates:  Health Maintenance  Topic Date Due   DTaP/Tdap/Td vaccine (1 - Tdap) Never done   Screening for Lung Cancer  Never done   Zoster (Shingles) Vaccine (1 of 2) Never done   Pap Smear  03/13/2022   COVID-19 Vaccine (1) 10/05/2022*   Flu Shot  08/27/2022   Medicare Annual Wellness Visit  08/03/2023   Mammogram  01/08/2024   Colon Cancer Screening  08/22/2028   Hepatitis C Screening  Completed   HIV Screening  Completed   HPV Vaccine  Aged Out  *Topic was postponed. The date shown is not the original due date.    Advanced directives:  Advance directive discussed with you today. Even though you declined this today, please call our office should you change your mind, and we can give you the proper paperwork for you to fill out. Advance care planning is a way to make decisions about medical care that fits your values in case you are ever unable to make these decisions for yourself.  Information on Advanced Care Planning can be found at [facility] Advance Health Care Directives Advance Health Care Directives ([email or web address]    Conditions/risks identified: Aim for 30 minutes of exercise or brisk walking, 6-8 glasses of water, and 5 servings of fruits and vegetables each day.  Keep your appointment on July 25th for your lung cancer screening  Next appointment: VIRTUAL/TELEPHONE APPOINTMENT Follow up in one year for your annual wellness visit.  September 08, 2023 at 9:00am telephone visit.   Preventive Care 40-64 Years, Female Preventive  care refers to lifestyle choices and visits with your health care provider that can promote health and wellness. What does preventive care include? A yearly physical exam. This is also called an annual well check. Dental exams once or twice a year. Routine eye exams. Ask your health care provider how often you should have your eyes checked. Personal lifestyle choices, including: Daily care of your teeth and gums. Regular physical activity. Eating a healthy diet. Avoiding tobacco and drug use. Limiting alcohol use. Practicing safe sex. Taking low-dose aspirin daily starting at age 6. Taking vitamin and mineral supplements as recommended by your health care provider. What happens during an annual well check? The services and screenings done by your health care provider during your annual well check will depend on your age, overall health, lifestyle risk factors, and family history of disease. Counseling  Your health care provider may ask you questions about your: Alcohol use. Tobacco use. Drug use. Emotional well-being. Home and relationship well-being. Sexual activity. Eating habits. Work and work Astronomer. Method of birth control. Menstrual cycle. Pregnancy history. Screening  You may have the following tests or measurements: Height, weight, and BMI. Blood pressure. Lipid and cholesterol levels. These may be checked every 5 years, or more frequently if you are over 42 years old. Skin check. Lung cancer screening. You may have this screening every year starting at age  55 if you have a 30-pack-year history of smoking and currently smoke or have quit within the past 15 years. Fecal occult blood test (FOBT) of the stool. You may have this test every year starting at age 15. Flexible sigmoidoscopy or colonoscopy. You may have a sigmoidoscopy every 5 years or a colonoscopy every 10 years starting at age 40. Hepatitis C blood test. Hepatitis B blood test. Sexually transmitted  disease (STD) testing. Diabetes screening. This is done by checking your blood sugar (glucose) after you have not eaten for a while (fasting). You may have this done every 1-3 years. Mammogram. This may be done every 1-2 years. Talk to your health care provider about when you should start having regular mammograms. This may depend on whether you have a family history of breast cancer. BRCA-related cancer screening. This may be done if you have a family history of breast, ovarian, tubal, or peritoneal cancers. Pelvic exam and Pap test. This may be done every 3 years starting at age 58. Starting at age 4, this may be done every 5 years if you have a Pap test in combination with an HPV test. Bone density scan. This is done to screen for osteoporosis. You may have this scan if you are at high risk for osteoporosis. Discuss your test results, treatment options, and if necessary, the need for more tests with your health care provider. Vaccines  Your health care provider may recommend certain vaccines, such as: Influenza vaccine. This is recommended every year. Tetanus, diphtheria, and acellular pertussis (Tdap, Td) vaccine. You may need a Td booster every 10 years. Zoster vaccine. You may need this after age 22. Pneumococcal 13-valent conjugate (PCV13) vaccine. You may need this if you have certain conditions and were not previously vaccinated. Pneumococcal polysaccharide (PPSV23) vaccine. You may need one or two doses if you smoke cigarettes or if you have certain conditions. Talk to your health care provider about which screenings and vaccines you need and how often you need them. This information is not intended to replace advice given to you by your health care provider. Make sure you discuss any questions you have with your health care provider. Document Released: 02/08/2015 Document Revised: 10/02/2015 Document Reviewed: 11/13/2014 Elsevier Interactive Patient Education  2017 [employer].    Fall Prevention in the Home Falls can cause injuries. They can happen to people of all ages. There are many things you can do to make your home safe and to help prevent falls. What can I do on the outside of my home? Regularly fix the edges of walkways and driveways and fix any cracks. Remove anything that might make you trip as you walk through a door, such as a raised step or threshold. Trim any bushes or trees on the path to your home. Use bright outdoor lighting. Clear any walking paths of anything that might make someone trip, such as rocks or tools. Regularly check to see if handrails are loose or broken. Make sure that both sides of any steps have handrails. Any raised decks and porches should have guardrails on the edges. Have any leaves, snow, or ice cleared regularly. Use sand or salt on walking paths during winter. Clean up any spills in your garage right away. This includes oil or grease spills. What can I do in the bathroom? Use night lights. Install grab bars by the toilet and in the tub and shower. Do not use towel bars as grab bars. Use non-skid mats or decals in the  tub or shower. If you need to sit down in the shower, use a plastic, non-slip stool. Keep the floor dry. Clean up any water that spills on the floor as soon as it happens. Remove soap buildup in the tub or shower regularly. Attach bath mats securely with double-sided non-slip rug tape. Do not have throw rugs and other things on the floor that can make you trip. What can I do in the bedroom? Use night lights. Make sure that you have a light by your bed that is easy to reach. Do not use any sheets or blankets that are too big for your bed. They should not hang down onto the floor. Have a firm chair that has side arms. You can use this for support while you get dressed. Do not have throw rugs and other things on the floor that can make you trip. What can I do in the kitchen? Clean up any spills right  away. Avoid walking on wet floors. Keep items that you use a lot in easy-to-reach places. If you need to reach something above you, use a strong step stool that has a grab bar. Keep electrical cords out of the way. Do not use floor polish or wax that makes floors slippery. If you must use wax, use non-skid floor wax. Do not have throw rugs and other things on the floor that can make you trip. What can I do with my stairs? Do not leave any items on the stairs. Make sure that there are handrails on both sides of the stairs and use them. Fix handrails that are broken or loose. Make sure that handrails are as long as the stairways. Check any carpeting to make sure that it is firmly attached to the stairs. Fix any carpet that is loose or worn. Avoid having throw rugs at the top or bottom of the stairs. If you do have throw rugs, attach them to the floor with carpet tape. Make sure that you have a light switch at the top of the stairs and the bottom of the stairs. If you do not have them, ask someone to add them for you. What else can I do to help prevent falls? Wear shoes that: Do not have high heels. Have rubber bottoms. Are comfortable and fit you well. Are closed at the toe. Do not wear sandals. If you use a stepladder: Make sure that it is fully opened. Do not climb a closed stepladder. Make sure that both sides of the stepladder are locked into place. Ask someone to hold it for you, if possible. Clearly mark and make sure that you can see: Any grab bars or handrails. First and last steps. Where the edge of each step is. Use tools that help you move around (mobility aids) if they are needed. These include: Canes. Walkers. Scooters. Crutches. Turn on the lights when you go into a dark area. Replace any light bulbs as soon as they burn out. Set up your furniture so you have a clear path. Avoid moving your furniture around. If any of your floors are uneven, fix them. If there are any  pets around you, be aware of where they are. Review your medicines with your doctor. Some medicines can make you feel dizzy. This can increase your chance of falling. Ask your doctor what other things that you can do to help prevent falls. This information is not intended to replace advice given to you by your health care provider. Make sure you discuss any  questions you have with your health care provider. Document Released: 11/08/2008 Document Revised: 06/20/2015 Document Reviewed: 02/16/2014 Elsevier Interactive Patient Education  2017 [employer].

## 2022-08-11 ENCOUNTER — Encounter: Payer: Self-pay | Admitting: Internal Medicine

## 2022-08-12 ENCOUNTER — Other Ambulatory Visit: Payer: Self-pay | Admitting: Family Medicine

## 2022-08-12 DIAGNOSIS — L732 Hidradenitis suppurativa: Secondary | ICD-10-CM

## 2022-08-19 ENCOUNTER — Encounter: Payer: Medicare HMO | Admitting: Acute Care

## 2022-08-19 ENCOUNTER — Telehealth: Payer: Self-pay | Admitting: Acute Care

## 2022-08-19 NOTE — Telephone Encounter (Signed)
Patient was a no show for her scheduled Shared decision making visit which is mandatory prior to Screening CT Chest. . I called the patient x 3 , and left HIPAA compliant message , and left  our office contact number . We asked her to call back so we could complete the visit. She never called back. She had been left a reminder voice mail the day prior to the visit as a courtesy on  7/23. We will cancel the Ct Chest, and we will get the required [facility] and CT scan rescheduled.

## 2022-08-19 NOTE — Telephone Encounter (Signed)
Patient called back and left VM. Called patient back and went to straight to VM, advised in VM if she has spam blocker to add our number to allow it to ring before going to VM. Will await call back.

## 2022-08-20 ENCOUNTER — Encounter: Payer: Self-pay | Admitting: Emergency Medicine

## 2022-08-20 ENCOUNTER — Ambulatory Visit (HOSPITAL_COMMUNITY): Payer: Medicare HMO

## 2022-08-20 ENCOUNTER — Other Ambulatory Visit: Payer: Medicare HMO

## 2022-08-20 NOTE — Telephone Encounter (Signed)
Letter has been printed and mailed to patient. This letter is informing her that she has missed her televisit [facility]) and LDCT on 08/19/22 and to call our office back to reschedule these visits.

## 2022-08-25 ENCOUNTER — Telehealth: Payer: Self-pay

## 2022-08-25 NOTE — Telephone Encounter (Signed)
Patient left message to get a return call. I called her back and left message for her to call the office.

## 2022-09-01 ENCOUNTER — Ambulatory Visit: Payer: Medicare HMO | Admitting: Orthopedic Surgery

## 2022-09-01 ENCOUNTER — Other Ambulatory Visit: Payer: Self-pay

## 2022-09-01 ENCOUNTER — Telehealth: Payer: Self-pay | Admitting: Family Medicine

## 2022-09-01 DIAGNOSIS — G8929 Other chronic pain: Secondary | ICD-10-CM

## 2022-09-01 DIAGNOSIS — K219 Gastro-esophageal reflux disease without esophagitis: Secondary | ICD-10-CM

## 2022-09-01 DIAGNOSIS — Z13220 Encounter for screening for lipoid disorders: Secondary | ICD-10-CM

## 2022-09-01 DIAGNOSIS — Z7984 Long term (current) use of oral hypoglycemic drugs: Secondary | ICD-10-CM | POA: Diagnosis not present

## 2022-09-01 DIAGNOSIS — Z720 Tobacco use: Secondary | ICD-10-CM | POA: Diagnosis not present

## 2022-09-01 DIAGNOSIS — Z833 Family history of diabetes mellitus: Secondary | ICD-10-CM | POA: Diagnosis not present

## 2022-09-01 DIAGNOSIS — R0602 Shortness of breath: Secondary | ICD-10-CM

## 2022-09-01 DIAGNOSIS — F419 Anxiety disorder, unspecified: Secondary | ICD-10-CM | POA: Diagnosis not present

## 2022-09-01 DIAGNOSIS — Z131 Encounter for screening for diabetes mellitus: Secondary | ICD-10-CM

## 2022-09-01 DIAGNOSIS — E559 Vitamin D deficiency, unspecified: Secondary | ICD-10-CM

## 2022-09-01 DIAGNOSIS — J45909 Unspecified asthma, uncomplicated: Secondary | ICD-10-CM | POA: Diagnosis not present

## 2022-09-01 DIAGNOSIS — Z8249 Family history of ischemic heart disease and other diseases of the circulatory system: Secondary | ICD-10-CM | POA: Diagnosis not present

## 2022-09-01 DIAGNOSIS — I10 Essential (primary) hypertension: Secondary | ICD-10-CM

## 2022-09-01 DIAGNOSIS — M1990 Unspecified osteoarthritis, unspecified site: Secondary | ICD-10-CM | POA: Diagnosis not present

## 2022-09-01 DIAGNOSIS — E785 Hyperlipidemia, unspecified: Secondary | ICD-10-CM | POA: Diagnosis not present

## 2022-09-01 DIAGNOSIS — K5900 Constipation, unspecified: Secondary | ICD-10-CM | POA: Diagnosis not present

## 2022-09-01 DIAGNOSIS — E119 Type 2 diabetes mellitus without complications: Secondary | ICD-10-CM | POA: Diagnosis not present

## 2022-09-01 MED ORDER — OMEPRAZOLE 20 MG PO CPDR
20.0000 mg | DELAYED_RELEASE_CAPSULE | Freq: Two times a day (BID) | ORAL | 2 refills | Status: DC
Start: 2022-09-01 — End: 2023-01-25

## 2022-09-01 MED ORDER — TRAZODONE HCL 50 MG PO TABS
50.0000 mg | ORAL_TABLET | Freq: Every day | ORAL | 3 refills | Status: DC
Start: 2022-09-01 — End: 2022-10-20

## 2022-09-01 MED ORDER — ALBUTEROL SULFATE HFA 108 (90 BASE) MCG/ACT IN AERS
2.0000 | INHALATION_SPRAY | Freq: Four times a day (QID) | RESPIRATORY_TRACT | 2 refills | Status: AC | PRN
Start: 2022-09-01 — End: ?

## 2022-09-01 MED ORDER — VITAMIN D3 125 MCG (5000 UT) PO CAPS
5000.0000 [IU] | ORAL_CAPSULE | Freq: Every day | ORAL | 1 refills | Status: DC
Start: 2022-09-01 — End: 2023-05-26

## 2022-09-01 MED ORDER — LABETALOL HCL 200 MG PO TABS
200.0000 mg | ORAL_TABLET | Freq: Two times a day (BID) | ORAL | 3 refills | Status: DC
Start: 2022-09-01 — End: 2022-10-20

## 2022-09-01 MED ORDER — METFORMIN HCL 1000 MG PO TABS
1000.0000 mg | ORAL_TABLET | Freq: Every day | ORAL | 3 refills | Status: DC
Start: 2022-09-01 — End: 2022-10-20

## 2022-09-01 MED ORDER — PRAVASTATIN SODIUM 80 MG PO TABS: 80.0000 mg | ORAL_TABLET | Freq: Every day | ORAL | 3 refills | Status: DC

## 2022-09-01 NOTE — Telephone Encounter (Signed)
Patient came by the office need refill on all medicine , if these can not be filled contact the patient. [phone]. Prescription Request  09/01/2022  LOV: 07/20/2022  What is the name of the medication or equipment? albuterol (VENTOLIN HFA) 108 (90 Base) MCG/ACT inhaler [[id number]]   Cholecalciferol (VITAMIN D3) 125 MCG (5000 UT) CAPS [[id number]]   cyclobenzaprine (FLEXERIL) 5 MG tablet [[id number]]   labetalol (NORMODYNE) 200 MG tablet [[id number]]   metFORMIN (GLUCOPHAGE) 1000 MG tablet [[id number]]   omeprazole (PRILOSEC) 20 MG capsule [[id number]]   pravastatin (PRAVACHOL) 80 MG tablet [[id number]]   traZODone (DESYREL) 50 MG tablet [[id number]   Have you contacted your pharmacy to request a refill? Yes   Which pharmacy would you like this sent to?  [facility] - [place], [place] - [address] [place] [address] Phone: [phone] Fax: [phone]    Patient notified that their request is being sent to the clinical staff for review and that they should receive a response within 2 business days.   Please advise at [place]  (patient came into office)

## 2022-09-24 ENCOUNTER — Telehealth: Payer: Self-pay | Admitting: Family Medicine

## 2022-09-24 NOTE — Telephone Encounter (Signed)
Boil evaluated at her June appt has still not went away and staying the same size. Wants to know what needs to be done next

## 2022-09-24 NOTE — Telephone Encounter (Signed)
Patient came by the office to give message to her provider.  The bole did not go away, could it be a cyst still staying the same size.  What is the next step. Call # [phone].

## 2022-09-24 NOTE — Telephone Encounter (Signed)
I sent in dermatology referral in our last visit please follow up, but if boil or cyst is worsening I advise ER or urgent care for Incision and drainage procedure

## 2022-09-25 NOTE — Telephone Encounter (Signed)
Aware and mailed address and number to her as requested to reschedule appt

## 2022-09-25 NOTE — Telephone Encounter (Signed)
Scheduled sooner appt follow up for cyst

## 2022-09-29 NOTE — Patient Instructions (Signed)

## 2022-09-29 NOTE — Progress Notes (Unsigned)
Patient Office Visit   Subjective   Patient ID: Autumn [name], female    DOB: 1965-08-05  Age: 57 y.o. MRN: [id number]  CC: No chief complaint on file.   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  HPI   Outpatient Encounter Medications as of 10/01/2022  Medication Sig   ACCU-CHEK GUIDE test strip USE TO CHECK BLOOD SUGAR UP TO TWICE DAILY   Accu-Chek Softclix Lancets lancets SMARTSIG:Topical 1-2 Times Daily   albuterol (VENTOLIN HFA) 108 (90 Base) MCG/ACT inhaler Inhale 2 puffs into the lungs every 6 (six) hours as needed for wheezing or shortness of breath.   Cholecalciferol (VITAMIN D3) 125 MCG (5000 UT) CAPS Take 1 capsule (5,000 Units total) by mouth daily.   cyclobenzaprine (FLEXERIL) 10 MG tablet Take 1 tablet (10 mg total) by mouth 2 (two) times daily as needed. (Patient not taking: Reported on 08/03/2022)   cyclobenzaprine (FLEXERIL) 5 MG tablet Take 1 tablet (5 mg total) by mouth 3 (three) times daily as needed for muscle spasms. (Patient not taking: Reported on 08/03/2022)   DULoxetine (CYMBALTA) 30 MG capsule    labetalol (NORMODYNE) 200 MG tablet Take 1 tablet (200 mg total) by mouth 2 (two) times daily.   LINZESS 290 MCG CAPS capsule TAKE 1 CAPSULE (290 MCG TOTAL) BY MOUTH DAILY BEFORE BREAKFAST.   LORazepam (ATIVAN) 0.5 MG tablet Take 0.5 mg by mouth at bedtime.   meclizine (ANTIVERT) 25 MG tablet SMARTSIG:1 Tablet(s) By Mouth 1-3 Times Daily   meloxicam (MOBIC) 15 MG tablet Take 15 mg by mouth daily.   metFORMIN (GLUCOPHAGE) 1000 MG tablet Take 1 tablet (1,000 mg total) by mouth daily with breakfast.   metFORMIN  (GLUCOPHAGE-XR) 500 MG 24 hr tablet TAKE 1 TABLET BY MOUTH EVERY MORNING FOR DIABETES   methocarbamol (ROBAXIN) 750 MG tablet Take 750 mg by mouth every 4 (four) hours as needed for muscle spasms. (Patient not taking: Reported on 08/03/2022)   naproxen (NAPROSYN) 500 MG tablet Take 500 mg by mouth 2 (two) times a day.   nitrofurantoin, macrocrystal-monohydrate, (MACROBID) 100 MG capsule Take 1 capsule (100 mg total) by mouth 2 (two) times daily. X 7 days (Patient not taking: Reported on 08/03/2022)   omeprazole (PRILOSEC) 20 MG capsule Take 1 capsule (20 mg total) by mouth 2 (two) times daily before a meal.   pravastatin (PRAVACHOL) 40 MG tablet TAKE 1 TABLET BY MOUTH EACH DAY FOR STOMACH ACID OR REFLUX   pravastatin (PRAVACHOL) 80 MG tablet Take 1 tablet (80 mg total) by mouth daily.   predniSONE (DELTASONE) 10 MG tablet Take 10 mg by mouth daily. (Patient not taking: Reported on 08/03/2022)   traZODone (DESYREL) 50 MG tablet Take 1 tablet (50 mg total) by mouth at bedtime.   No facility-administered encounter medications on file as of 10/01/2022.    Past Surgical History:  Procedure Laterality Date   BALLOON DILATION N/A 10/03/2020   Procedure: BALLOON DILATION;  Surgeon: Lanelle Bal, DO;  Location: [facility];  Service: Endoscopy;  Laterality: N/A;   BIOPSY  10/03/2020   Procedure: BIOPSY;  Surgeon: Lanelle Bal, DO;  Location: [facility];  Service: Endoscopy;;   BREAST LUMPECTOMY Right    age  20's-benign   CESAREAN SECTION     x3   COLONOSCOPY     COLONOSCOPY WITH PROPOFOL N/A 08/23/2018   PROPOFOL;  Surgeon: West Bali, MD; three 2-4 mm polyps in the rectum and sigmoid colon, external and internal hemorrhoids, tortuous left colon.  Pathology with 3 hyperplastic polyps.  Next colonoscopy in 2030.   DENTAL SURGERY     ESOPHAGOGASTRODUODENOSCOPY (EGD) WITH PROPOFOL N/A 10/03/2020   Surgeon: Lanelle Bal, DO; Normal esophagus, Gastritis biopsied (nonspecific reactive  gastropathy, negative for H. pylori), normal examined duodenum.   MASS EXCISION N/A 12/06/2017   Procedure: EXCISION 1CM CYST ON FACE;  Surgeon: Lucretia Roers, MD;  Location: [facility];  Service: General;  Laterality: N/A;   MULTIPLE EXTRACTIONS WITH ALVEOLOPLASTY Bilateral 04/12/2015   Procedure: BILATERAL MULTIPLE EXTRACTIONS NUMBERS TWENTY, TWENTY ONE, TWENTY TWO, TWENTY THREE, TWENTY FOUR, TWENTY FIVE, TWENTY EIGHT, TWENTY NINE WITH ALVEOLOPLASTY;  Surgeon: Ocie Doyne, DDS;  Location: [facility];  Service: Oral Surgery;  Laterality: Bilateral;   POLYPECTOMY  08/23/2018   Procedure: POLYPECTOMY;  Surgeon: West Bali, MD;  Location: [facility];  Service: Endoscopy;;   TUBAL LIGATION      ROS    Objective    LMP 11/11/2016   Physical Exam    Assessment & Plan:  There are no diagnoses linked to this encounter.  No follow-ups on file.   Cruzita Lederer Newman Nip, FNP

## 2022-10-01 ENCOUNTER — Ambulatory Visit: Payer: Medicare HMO | Admitting: Family Medicine

## 2022-10-01 DIAGNOSIS — E119 Type 2 diabetes mellitus without complications: Secondary | ICD-10-CM

## 2022-10-01 DIAGNOSIS — E782 Mixed hyperlipidemia: Secondary | ICD-10-CM

## 2022-10-06 ENCOUNTER — Encounter: Payer: Self-pay | Admitting: Family Medicine

## 2022-10-16 ENCOUNTER — Ambulatory Visit (INDEPENDENT_AMBULATORY_CARE_PROVIDER_SITE_OTHER): Payer: Medicare HMO | Admitting: Internal Medicine

## 2022-10-16 ENCOUNTER — Encounter: Payer: Self-pay | Admitting: Internal Medicine

## 2022-10-16 VITALS — BP 122/74 | HR 97 | Ht 64.0 in | Wt 266.2 lb

## 2022-10-16 DIAGNOSIS — F419 Anxiety disorder, unspecified: Secondary | ICD-10-CM | POA: Diagnosis not present

## 2022-10-16 DIAGNOSIS — J189 Pneumonia, unspecified organism: Secondary | ICD-10-CM | POA: Diagnosis not present

## 2022-10-16 DIAGNOSIS — F17200 Nicotine dependence, unspecified, uncomplicated: Secondary | ICD-10-CM | POA: Diagnosis not present

## 2022-10-16 DIAGNOSIS — R053 Chronic cough: Secondary | ICD-10-CM | POA: Diagnosis not present

## 2022-10-16 MED ORDER — AMOXICILLIN-POT CLAVULANATE 875-125 MG PO TABS
1.0000 | ORAL_TABLET | Freq: Two times a day (BID) | ORAL | 0 refills | Status: AC
Start: 2022-10-16 — End: 2022-10-21

## 2022-10-16 MED ORDER — HYDROXYZINE PAMOATE 25 MG PO CAPS
25.0000 mg | ORAL_CAPSULE | Freq: Three times a day (TID) | ORAL | 0 refills | Status: DC | PRN
Start: 2022-10-16 — End: 2023-01-25

## 2022-10-16 NOTE — Patient Instructions (Signed)
It was a pleasure to see you today.  Thank you for giving [place] the opportunity to be involved in your care.  Below is a brief recap of your visit and next steps.  We will plan to see you again next week (9/24)  Summary Augmentin x 5 days prescribed for treatment of infection Hydroxyzine added for as needed anxiety relief Follow up as scheduled on 9/24.

## 2022-10-16 NOTE — Assessment & Plan Note (Signed)
Reports currently smoking less than 0.5 packs/day of cigarettes.  Per review of prior documentation, she has accumulated at least a 31-pack-year smoking history.  I am concerned that her presentation today is at least partially reflective of undiagnosed COPD.  This can be further discussed with her PCP at follow-up on 9/24. -Smoking cessation was strongly encouraged.  She has previously tried NRT (gum and lozenges).  She is interested in other options.  States that she currently smokes cigarettes when she feels anxious.  Through shared decision making, hydroxyzine 25 mg as needed for anxiety relief has been prescribed today.

## 2022-10-16 NOTE — Assessment & Plan Note (Signed)
Presenting today for evaluation of a persistent cough.  She endorses a cough with scant sputum production x 3-4 weeks.  Sputum is dark yellow.  She additionally endorses sinus congestion, sore throat, and subjective fever/chills but has not checked her temperature.  Increased frequency of albuterol inhaler use is also noted.  Her exam is reassuring.  Poor air movement is noted bilaterally.  Overall, her symptoms are potentially reflective of respiratory bacterial infection vs undiagnosed COPD. -Augmentin x 5 days prescribed for treatment of community-acquired pneumonia -Systemic steroids deferred in the setting of diabetes mellitus -Recommend continued supportive care measures with OTC cough/cold medications -She will return to care for previously scheduled follow-up with her PCP on 9/24.  Consider chest x-ray at that time if symptoms have not significantly improved.  Given her history of tobacco use, I am concerned that she may have underlying COPD as well.  She endorses increased frequency of albuterol inhaler use.  It would be reasonable to trial maintenance therapy.

## 2022-10-16 NOTE — Progress Notes (Signed)
Acute Office Visit  Subjective:     Patient ID: Autumn [name], female    DOB: [birth year], 57 y.o.   MRN: [id number]  Chief Complaint  Patient presents with   Cough    Coughing for three weeks , no mucus   Sore Throat   Autumn [name] presents today for an acute visit endorsing a 3-4-week history of cough with scant sputum production.  She additionally endorses a sore throat, sinus congestion, and subjective fever/chills.  She has tried taking NyQuil and gargling with salt water.  Autumn [name] also reports increased frequency of albuterol inhaler use.  Overall her symptoms have not improved.  They have not significantly worsened but have persisted.  She has multiple grandchildren that have been sick recently.    Review of Systems  Constitutional:  Positive for chills.  HENT:  Positive for congestion and sore throat.   Respiratory:  Positive for cough, sputum production and shortness of breath.         Objective:    BP 122/74 (BP Location: Left Arm, Patient Position: Sitting, Cuff Size: Large)   Pulse 97   Ht 5\' 4"  (1.626 m)   Wt 266 lb 3.2 oz (120.7 kg)   LMP 11/11/2016   SpO2 95%   BMI 45.69 kg/m    Physical Exam Vitals reviewed.  Constitutional:      General: She is not in acute distress.    Appearance: Normal appearance. She is obese. She is not toxic-appearing.  HENT:     Head: Normocephalic and atraumatic.     Right Ear: Tympanic membrane, ear canal and external ear normal.     Left Ear: Tympanic membrane, ear canal and external ear normal.     Nose: Congestion and rhinorrhea present.     Mouth/Throat:     Mouth: Mucous membranes are moist.     Pharynx: Oropharynx is clear. No oropharyngeal exudate or posterior oropharyngeal erythema.  Eyes:     General: No scleral icterus.    Extraocular Movements: Extraocular movements intact.     Conjunctiva/sclera: Conjunctivae normal.     Pupils: Pupils are equal, round, and reactive to light.   Cardiovascular:     Rate and Rhythm: Normal rate and regular rhythm.     Pulses: Normal pulses.     Heart sounds: Normal heart sounds. No murmur heard.    No friction rub. No gallop.  Pulmonary:     Effort: Pulmonary effort is normal.     Breath sounds: No wheezing, rhonchi or rales.     Comments: Poor air movement bilaterally Abdominal:     General: Abdomen is flat. Bowel sounds are normal. There is no distension.     Palpations: Abdomen is soft.     Tenderness: There is no abdominal tenderness.  Musculoskeletal:        General: No swelling. Normal range of motion.     Cervical back: Normal range of motion.     Right lower leg: No edema.     Left lower leg: No edema.  Lymphadenopathy:     Cervical: No cervical adenopathy.  Skin:    General: Skin is warm and dry.     Capillary Refill: Capillary refill takes less than 2 seconds.     Coloration: Skin is not jaundiced.  Neurological:     General: No focal deficit present.     Mental Status: She is alert and oriented to person, place, and time.  Psychiatric:  Mood and Affect: Mood normal.        Behavior: Behavior normal.       Assessment & Plan:   Problem List Items Addressed This Visit       CIGARETTE SMOKER    Reports currently smoking less than 0.5 packs/day of cigarettes.  Per review of prior documentation, she has accumulated at least a 31-pack-year smoking history.  I am concerned that her presentation today is at least partially reflective of undiagnosed COPD.  This can be further discussed with her PCP at follow-up on 9/24. -Smoking cessation was strongly encouraged.  She has previously tried NRT (gum and lozenges).  She is interested in other options.  States that she currently smokes cigarettes when she feels anxious.  Through shared decision making, hydroxyzine 25 mg as needed for anxiety relief has been prescribed today.      Persistent cough for 3 weeks or longer    Presenting today for evaluation of a  persistent cough.  She endorses a cough with scant sputum production x 3-4 weeks.  Sputum is dark yellow.  She additionally endorses sinus congestion, sore throat, and subjective fever/chills but has not checked her temperature.  Increased frequency of albuterol inhaler use is also noted.  Her exam is reassuring.  Poor air movement is noted bilaterally.  Overall, her symptoms are potentially reflective of respiratory bacterial infection vs undiagnosed COPD. -Augmentin x 5 days prescribed for treatment of community-acquired pneumonia -Systemic steroids deferred in the setting of diabetes mellitus -Recommend continued supportive care measures with OTC cough/cold medications -She will return to care for previously scheduled follow-up with her PCP on 9/24.  Consider chest x-ray at that time if symptoms have not significantly improved.  Given her history of tobacco use, I am concerned that she may have underlying COPD as well.  She endorses increased frequency of albuterol inhaler use.  It would be reasonable to trial maintenance therapy.      Meds ordered this encounter  Medications   amoxicillin-clavulanate (AUGMENTIN) 875-125 MG tablet    Sig: Take 1 tablet by mouth 2 (two) times daily for 5 days.    Dispense:  10 tablet    Refill:  0   hydrOXYzine (VISTARIL) 25 MG capsule    Sig: Take 1 capsule (25 mg total) by mouth every 8 (eight) hours as needed.    Dispense:  30 capsule    Refill:  0    Return if symptoms worsen or fail to improve.  Billie Lade, MD

## 2022-10-20 ENCOUNTER — Ambulatory Visit (INDEPENDENT_AMBULATORY_CARE_PROVIDER_SITE_OTHER): Payer: Medicare HMO | Admitting: Family Medicine

## 2022-10-20 ENCOUNTER — Encounter: Payer: Self-pay | Admitting: Family Medicine

## 2022-10-20 ENCOUNTER — Ambulatory Visit (HOSPITAL_COMMUNITY)
Admission: RE | Admit: 2022-10-20 | Discharge: 2022-10-20 | Disposition: A | Discharge status: Home / Self Care | Payer: Medicare HMO | Source: Ambulatory Visit | Attending: Family Medicine | Admitting: Family Medicine

## 2022-10-20 VITALS — BP 111/75 | HR 99 | Ht 64.0 in | Wt 271.0 lb

## 2022-10-20 DIAGNOSIS — R079 Chest pain, unspecified: Secondary | ICD-10-CM | POA: Diagnosis not present

## 2022-10-20 DIAGNOSIS — J069 Acute upper respiratory infection, unspecified: Secondary | ICD-10-CM

## 2022-10-20 DIAGNOSIS — Z7984 Long term (current) use of oral hypoglycemic drugs: Secondary | ICD-10-CM

## 2022-10-20 DIAGNOSIS — R918 Other nonspecific abnormal finding of lung field: Secondary | ICD-10-CM | POA: Diagnosis not present

## 2022-10-20 DIAGNOSIS — E119 Type 2 diabetes mellitus without complications: Secondary | ICD-10-CM

## 2022-10-20 DIAGNOSIS — R059 Cough, unspecified: Secondary | ICD-10-CM | POA: Diagnosis not present

## 2022-10-20 DIAGNOSIS — I10 Essential (primary) hypertension: Secondary | ICD-10-CM

## 2022-10-20 DIAGNOSIS — E782 Mixed hyperlipidemia: Secondary | ICD-10-CM | POA: Diagnosis not present

## 2022-10-20 DIAGNOSIS — R0602 Shortness of breath: Secondary | ICD-10-CM | POA: Diagnosis not present

## 2022-10-20 DIAGNOSIS — M25562 Pain in left knee: Secondary | ICD-10-CM

## 2022-10-20 DIAGNOSIS — G8929 Other chronic pain: Secondary | ICD-10-CM

## 2022-10-20 MED ORDER — TRAZODONE HCL 50 MG PO TABS
50.0000 mg | ORAL_TABLET | Freq: Every day | ORAL | 3 refills | Status: DC
Start: 2022-10-20 — End: 2023-01-25

## 2022-10-20 MED ORDER — BENZONATATE 200 MG PO CAPS
200.0000 mg | ORAL_CAPSULE | Freq: Two times a day (BID) | ORAL | 0 refills | Status: DC | PRN
Start: 2022-10-20 — End: 2023-01-25

## 2022-10-20 MED ORDER — LABETALOL HCL 200 MG PO TABS
200.0000 mg | ORAL_TABLET | Freq: Two times a day (BID) | ORAL | 3 refills | Status: DC
Start: 2022-10-20 — End: 2023-05-10

## 2022-10-20 MED ORDER — AZITHROMYCIN 250 MG PO TABS
ORAL_TABLET | ORAL | 0 refills | Status: DC
Start: 2022-10-20 — End: 2023-01-25

## 2022-10-20 MED ORDER — PREDNISONE 20 MG PO TABS
20.0000 mg | ORAL_TABLET | Freq: Two times a day (BID) | ORAL | 0 refills | Status: AC
Start: 2022-10-20 — End: 2022-10-25

## 2022-10-20 NOTE — Progress Notes (Signed)
Patient Office Visit   Subjective   Patient ID: Autumn [name], female    DOB: September 23, 1965  Age: 57 y.o. MRN: [id number]  CC:  Chief Complaint  Patient presents with   Care Management    3 month f/u   Cough    Pt reports cough ongoing for 3 weeks, causing ribs to hurt from how bad it has gotten.     HPI Autumn [name] 57 year old female, presents to the clinic for worsening cough and shortness of breath started 3 weeks ago. 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  Patient complains dry cough, and chest tenderness. Patient describes symptoms of shortness of breath on exertion, cough, fatigue, malaise, myalgias, , sweats, and wheezing. Symptoms began 3 weeks ago and are gradually worsening since that time. Patient denies history of previous pneumonias. Treatment thus far includes OTC analgesics/antipyretics: not very effective and anti-tussive: not very effective Past pulmonary history is significant for chronic bronchitis       Outpatient Encounter Medications as of 10/20/2022  Medication Sig   ACCU-CHEK GUIDE test strip USE TO CHECK BLOOD SUGAR UP TO TWICE DAILY   Accu-Chek Softclix Lancets lancets SMARTSIG:Topical 1-2 Times Daily   albuterol (VENTOLIN HFA) 108 (90 Base) MCG/ACT inhaler Inhale 2 puffs into the lungs every 6 (six) hours as needed for wheezing or shortness of breath.   amoxicillin-clavulanate (AUGMENTIN) 875-125 MG tablet Take 1 tablet by mouth 2 (two) times daily for 5 days.   azithromycin (ZITHROMAX) 250 MG tablet Take 2 tablets on day 1, then 1 tablet daily on days 2 through 5   benzonatate (TESSALON) 200 MG capsule Take 1 capsule (200 mg total) by mouth 2 (two) times daily as  needed for cough.   Cholecalciferol (VITAMIN D3) 125 MCG (5000 UT) CAPS Take 1 capsule (5,000 Units total) by mouth daily.   hydrOXYzine (VISTARIL) 25 MG capsule Take 1 capsule (25 mg total) by mouth every 8 (eight) hours as needed.   LINZESS 290 MCG CAPS capsule TAKE 1 CAPSULE (290 MCG TOTAL) BY MOUTH DAILY BEFORE BREAKFAST.   meloxicam (MOBIC) 15 MG tablet Take 15 mg by mouth daily.   metFORMIN (GLUCOPHAGE-XR) 500 MG 24 hr tablet TAKE 1 TABLET BY MOUTH EVERY MORNING FOR DIABETES   naproxen (NAPROSYN) 500 MG tablet Take 500 mg by mouth 2 (two) times a day.   omeprazole (PRILOSEC) 20 MG capsule Take 1 capsule (20 mg total) by mouth 2 (two) times daily before a meal.   pravastatin (PRAVACHOL) 40 MG tablet TAKE 1 TABLET BY MOUTH EACH DAY FOR STOMACH ACID OR REFLUX   pravastatin (PRAVACHOL) 80 MG tablet Take 1 tablet (80 mg total) by mouth daily.   predniSONE (DELTASONE) 20 MG tablet Take 1 tablet (20 mg total) by mouth 2 (two) times daily with a meal for 5 days.   [DISCONTINUED] DULoxetine (CYMBALTA) 30 MG capsule    [DISCONTINUED] labetalol (NORMODYNE) 200 MG tablet Take 1 tablet (200 mg total) by mouth 2 (two) times daily.   [DISCONTINUED] LORazepam (ATIVAN) 0.5 MG tablet Take 0.5 mg by mouth at bedtime.   [DISCONTINUED] meclizine (ANTIVERT) 25 MG tablet SMARTSIG:1 Tablet(s) By Mouth 1-3 Times Daily   [DISCONTINUED] metFORMIN (GLUCOPHAGE) 1000 MG tablet Take 1 tablet (1,000 mg total) by mouth daily with breakfast.   [DISCONTINUED] traZODone (  DESYREL) 50 MG tablet Take 1 tablet (50 mg total) by mouth at bedtime.   labetalol (NORMODYNE) 200 MG tablet Take 1 tablet (200 mg total) by mouth 2 (two) times daily.   traZODone (DESYREL) 50 MG tablet Take 1 tablet (50 mg total) by mouth at bedtime.   No facility-administered encounter medications on file as of 10/20/2022.    Past Surgical History:  Procedure Laterality Date   BALLOON DILATION N/A 10/03/2020   Procedure: BALLOON DILATION;  Surgeon:  Lanelle Bal, DO;  Location: [facility];  Service: Endoscopy;  Laterality: N/A;   BIOPSY  10/03/2020   Procedure: BIOPSY;  Surgeon: Lanelle Bal, DO;  Location: [facility];  Service: Endoscopy;;   BREAST LUMPECTOMY Right    age 59's-benign   CESAREAN SECTION     x3   COLONOSCOPY     COLONOSCOPY WITH PROPOFOL N/A 08/23/2018   PROPOFOL;  Surgeon: West Bali, MD; three 2-4 mm polyps in the rectum and sigmoid colon, external and internal hemorrhoids, tortuous left colon.  Pathology with 3 hyperplastic polyps.  Next colonoscopy in 2030.   DENTAL SURGERY     ESOPHAGOGASTRODUODENOSCOPY (EGD) WITH PROPOFOL N/A 10/03/2020   Surgeon: Lanelle Bal, DO; Normal esophagus, Gastritis biopsied (nonspecific reactive gastropathy, negative for H. pylori), normal examined duodenum.   MASS EXCISION N/A 12/06/2017   Procedure: EXCISION 1CM CYST ON FACE;  Surgeon: Lucretia Roers, MD;  Location: [facility];  Service: General;  Laterality: N/A;   MULTIPLE EXTRACTIONS WITH ALVEOLOPLASTY Bilateral 04/12/2015   Procedure: BILATERAL MULTIPLE EXTRACTIONS NUMBERS TWENTY, TWENTY ONE, TWENTY TWO, TWENTY THREE, TWENTY FOUR, TWENTY FIVE, TWENTY EIGHT, TWENTY NINE WITH ALVEOLOPLASTY;  Surgeon: Ocie Doyne, DDS;  Location: [facility];  Service: Oral Surgery;  Laterality: Bilateral;   POLYPECTOMY  08/23/2018   Procedure: POLYPECTOMY;  Surgeon: West Bali, MD;  Location: [facility];  Service: Endoscopy;;   TUBAL LIGATION      Review of Systems  Constitutional:  Positive for diaphoresis and malaise/fatigue. Negative for fever.  Eyes:  Negative for blurred vision.  Respiratory:  Positive for cough, shortness of breath and wheezing.   Cardiovascular:  Negative for palpitations.  Gastrointestinal:  Negative for abdominal pain.  Genitourinary:  Negative for dysuria.  Neurological:  Negative for dizziness and headaches.      Objective    BP 111/75   Pulse 99   Ht 5\' 4"  (1.626 m)   Wt 271 lb 0.6  oz (122.9 kg)   LMP 11/11/2016   SpO2 93%   BMI 46.52 kg/m   Physical Exam Vitals reviewed.  Constitutional:      General: She is not in acute distress.    Appearance: Normal appearance. She is not ill-appearing, toxic-appearing or diaphoretic.  HENT:     Head: Normocephalic.  Eyes:     General:        Right eye: No discharge.        Left eye: No discharge.     Conjunctiva/sclera: Conjunctivae normal.  Cardiovascular:     Rate and Rhythm: Normal rate.     Pulses: Normal pulses.     Heart sounds: Normal heart sounds.  Pulmonary:     Effort: No respiratory distress.     Breath sounds: Rhonchi present.  Chest:     Chest wall: Tenderness present.  Abdominal:     General: Bowel sounds are normal.     Palpations: Abdomen is soft.  Musculoskeletal:  General: Normal range of motion.     Cervical back: Normal range of motion.  Skin:    General: Skin is warm and dry.     Capillary Refill: Capillary refill takes less than 2 seconds.  Neurological:     Mental Status: She is alert.  Psychiatric:        Mood and Affect: Mood normal.        Behavior: Behavior normal.       Assessment & Plan:  Cough, unspecified type -     DG Chest 2 View; Future -     Azithromycin; Take 2 tablets on day 1, then 1 tablet daily on days 2 through 5  Dispense: 6 tablet; Refill: 0 -     predniSONE; Take 1 tablet (20 mg total) by mouth 2 (two) times daily with a meal for 5 days.  Dispense: 10 tablet; Refill: 0 -     Benzonatate; Take 1 capsule (200 mg total) by mouth 2 (two) times daily as needed for cough.  Dispense: 20 capsule; Refill: 0  Type 2 diabetes mellitus without complication, without long-term current use of insulin (HCC) -     Hemoglobin A1c  Mixed hyperlipidemia -     Lipid panel  Primary hypertension -     BMP8+eGFR -     CBC with Differential/Platelet -     Labetalol HCl; Take 1 tablet (200 mg total) by mouth 2 (two) times daily.  Dispense: 90 tablet; Refill: 3  Chronic  pain of left knee -     traZODone HCl; Take 1 tablet (50 mg total) by mouth at bedtime.  Dispense: 30 tablet; Refill: 3  Upper respiratory tract infection, unspecified type Assessment & Plan: Azithromycin 250 x 5 days Prednisone 20 mg twice daily x 5 days Benzonatate 200 mg PRN for cough Albuterol PRN Chest Xray ordered awaiting results will follow up Discussed symptomatic treatment, rest, increase oral fluid intake. Take OTC tylenol fever or pain medications. Follow-up for worsening or persistent symptoms. Patient verbalizes understanding regarding plan of care and all questions answered      Return in about 3 months (around 01/19/2023), or if symptoms worsen or fail to improve, for chronic follow-up.   Cruzita Lederer Newman Nip, FNP

## 2022-10-20 NOTE — Patient Instructions (Signed)

## 2022-10-20 NOTE — Assessment & Plan Note (Addendum)
Azithromycin 250 x 5 days Prednisone 20 mg twice daily x 5 days Benzonatate 200 mg PRN for cough Albuterol PRN Chest Xray ordered awaiting results will follow up Discussed symptomatic treatment, rest, increase oral fluid intake. Take OTC tylenol fever or pain medications. Follow-up for worsening or persistent symptoms. Patient verbalizes understanding regarding plan of care and all questions answered

## 2022-10-21 ENCOUNTER — Other Ambulatory Visit: Payer: Self-pay | Admitting: Family Medicine

## 2022-10-21 LAB — CBC WITH DIFFERENTIAL/PLATELET
Basophils Absolute: 0.1 10*3/uL (ref 0.0–0.2)
Basos: 1 %
EOS (ABSOLUTE): 0.2 10*3/uL (ref 0.0–0.4)
Eos: 3 %
Hematocrit: 37.5 % (ref 34.0–46.6)
Hemoglobin: 11.4 g/dL (ref 11.1–15.9)
Immature Grans (Abs): 0 10*3/uL (ref 0.0–0.1)
Immature Granulocytes: 0 %
Lymphocytes Absolute: 3 10*3/uL (ref 0.7–3.1)
Lymphs: 36 %
MCH: 22.1 pg — ABNORMAL LOW (ref 26.6–33.0)
MCHC: 30.4 g/dL — ABNORMAL LOW (ref 31.5–35.7)
MCV: 73 fL — ABNORMAL LOW (ref 79–97)
Monocytes Absolute: 0.7 10*3/uL (ref 0.1–0.9)
Monocytes: 9 %
Neutrophils Absolute: 4.3 10*3/uL (ref 1.4–7.0)
Neutrophils: 51 %
Platelets: 467 10*3/uL — ABNORMAL HIGH (ref 150–450)
RBC: 5.15 x10E6/uL (ref 3.77–5.28)
RDW: 15 % (ref 11.7–15.4)
WBC: 8.3 10*3/uL (ref 3.4–10.8)

## 2022-10-21 LAB — HEMOGLOBIN A1C
Est. average glucose Bld gHb Est-mCnc: 160 mg/dL
Hgb A1c MFr Bld: 7.2 % — ABNORMAL HIGH (ref 4.8–5.6)

## 2022-10-21 LAB — BMP8+EGFR
BUN/Creatinine Ratio: 9 (ref 9–23)
BUN: 8 mg/dL (ref 6–24)
CO2: 23 mmol/L (ref 20–29)
Calcium: 9.8 mg/dL (ref 8.7–10.2)
Chloride: 101 mmol/L (ref 96–106)
Creatinine, Ser: 0.9 mg/dL (ref 0.57–1.00)
Glucose: 139 mg/dL — ABNORMAL HIGH (ref 70–99)
Potassium: 4.4 mmol/L (ref 3.5–5.2)
Sodium: 139 mmol/L (ref 134–144)
eGFR: 76 mL/min/{1.73_m2} (ref >59)

## 2022-10-21 LAB — LIPID PANEL
Chol/HDL Ratio: 4.1 ratio (ref 0.0–4.4)
Cholesterol, Total: 207 mg/dL — ABNORMAL HIGH (ref 100–199)
HDL: 51 mg/dL (ref >39)
LDL Chol Calc (NIH): 122 mg/dL — ABNORMAL HIGH (ref 0–99)
Triglycerides: 193 mg/dL — ABNORMAL HIGH (ref 0–149)
VLDL Cholesterol Cal: 34 mg/dL (ref 5–40)

## 2022-10-21 MED ORDER — METFORMIN HCL 1000 MG PO TABS: 1000.0000 mg | ORAL_TABLET | Freq: Two times a day (BID) | ORAL | 3 refills | Status: DC

## 2022-10-21 MED ORDER — OMEGA 3 1000 MG PO CAPS: 2.0000 | ORAL_CAPSULE | Freq: Every day | ORAL | 2 refills | Status: DC

## 2022-10-21 NOTE — Progress Notes (Signed)
Hello Please Mail labs results to patient

## 2022-10-21 NOTE — Progress Notes (Signed)
Please inform patient,  Hemoglobin A1 7.2 - start taking Metformin 1000 mg twice daily.  It is important to follow a DASH diet which includes vegetables,fruits,whole grains, fat free or low fat diary,fish,poultry,beans,nuts and seeds,vegetable oils. Find an activity that you will enjoyandstart to be active at least 5 days a week for 30 minutes each day.     Cholesterol  and Triglycerides levels elevated, I encourage over the counter omega-3 fish oil 1000 mg twice daily. Continue Pravastatin 80 mg daily  Advise lifestyle modifications follow diet low in saturated fat, reduce dietary salt intake, avoid fatty foods, maintain an exercise routine 3 to 5 days a week for a minimum total of 150 minutes.  Patients with high cholesterol are at risk for Coronary Heart Diease such as myocardial infarction, angina and coronary artery stenosis.

## 2022-10-22 ENCOUNTER — Telehealth: Payer: Self-pay | Admitting: Family Medicine

## 2022-10-22 NOTE — Telephone Encounter (Signed)
Patient called ask what does fish oil do Omega 3? Can nurse give patient a call.

## 2022-10-23 NOTE — Telephone Encounter (Signed)
Spoke with pt let her know fish oil should help with cholesterol levels since her labs showed abnormal.

## 2022-10-30 ENCOUNTER — Telehealth: Payer: Self-pay | Admitting: Family Medicine

## 2022-10-30 NOTE — Telephone Encounter (Signed)
Patient calling about xray results. Asking for a call back.

## 2022-10-30 NOTE — Telephone Encounter (Signed)
Results still pending.

## 2022-11-08 NOTE — Progress Notes (Signed)
Please inform patient:  Your chest X-ray shows some mild changes in the lung tissue that could be due to an infection, fluid buildup,  but there's no clear sign of pneumonia.  The antibiotic I provided in our last visit should take care of the infection.  Quitting smoking is one of the best things you can do for your lung health. It will help your lungs heal and reduce the risk of future issues.

## 2022-11-09 NOTE — Progress Notes (Signed)
Please tell patient to make an appointment chest xray shows ribs so I am not sure what she means by thaht

## 2023-01-07 NOTE — Progress Notes (Signed)
Referring Provider: Wylene Men* Primary Care Physician:  Rica Records, FNP Primary GI Physician: Dr. Marletta Lor  Chief Complaint  Patient presents with   Rectal Bleeding    Rectal bleeding and knot on butt     HPI:   Autumn [name] is a 57 y.o. female with history of constipation, GERD, dysphagia, rectal bleeding due to internal hemorrhoids.  Colonoscopy up-to-date July 2020 with 3 hyperplastic polyps, external/internal hemorrhoids, tortuous left colon, due for repeat in 2030.  She is presenting today with chief complaint of rectal bleeding and knot on butt.   Today:  3 days ago had a small amount of blood in the stool. Reports it was just 1 spot. No recurrence.   Has a little knot on the outside of her anal opening that is uncomfortable when sitting. Hasn't started using hydrocortisone cream.   Continues with constipation. Small BMs in the morning. Stools aren't hard, but aren't productive. Doesn't eat many vegetables. Drinks plenty of water. No abdominal pain.   Past Medical History:  Diagnosis Date   Anxiety    Panic attacks- "cries"   Arthritis    Complication of anesthesia    had some hallucations   Diabetes mellitus without complication (HCC)    Fibroids, submucosal 12/02/2016   GERD (gastroesophageal reflux disease)    H/O colonoscopy    Hypertension    Rectal bleeding    in setting of hemorrhoids. Colonoscopy in July 2020   Shortness of breath dyspnea    with exertion   Thickened endometrium 12/02/2016    Past Surgical History:  Procedure Laterality Date   BALLOON DILATION N/A 10/03/2020   Procedure: BALLOON DILATION;  Surgeon: Lanelle Bal, DO;  Location: [facility];  Service: Endoscopy;  Laterality: N/A;   BIOPSY  10/03/2020   Procedure: BIOPSY;  Surgeon: Lanelle Bal, DO;  Location: [facility];  Service: Endoscopy;;   BREAST LUMPECTOMY Right    age 74's-benign   CESAREAN SECTION     x3   COLONOSCOPY      COLONOSCOPY WITH PROPOFOL N/A 08/23/2018   PROPOFOL;  Surgeon: West Bali, MD; three 2-4 mm polyps in the rectum and sigmoid colon, external and internal hemorrhoids, tortuous left colon.  Pathology with 3 hyperplastic polyps.  Next colonoscopy in 2030.   DENTAL SURGERY     ESOPHAGOGASTRODUODENOSCOPY (EGD) WITH PROPOFOL N/A 10/03/2020   Surgeon: Lanelle Bal, DO; Normal esophagus, Gastritis biopsied (nonspecific reactive gastropathy, negative for H. pylori), normal examined duodenum.   MASS EXCISION N/A 12/06/2017   Procedure: EXCISION 1CM CYST ON FACE;  Surgeon: Lucretia Roers, MD;  Location: [facility];  Service: General;  Laterality: N/A;   MULTIPLE EXTRACTIONS WITH ALVEOLOPLASTY Bilateral 04/12/2015   Procedure: BILATERAL MULTIPLE EXTRACTIONS NUMBERS TWENTY, TWENTY ONE, TWENTY TWO, TWENTY THREE, TWENTY FOUR, TWENTY FIVE, TWENTY EIGHT, TWENTY NINE WITH ALVEOLOPLASTY;  Surgeon: Ocie Doyne, DDS;  Location: [facility];  Service: Oral Surgery;  Laterality: Bilateral;   POLYPECTOMY  08/23/2018   Procedure: POLYPECTOMY;  Surgeon: West Bali, MD;  Location: [facility];  Service: Endoscopy;;   TUBAL LIGATION      Current Outpatient Medications  Medication Sig Dispense Refill   ACCU-CHEK GUIDE test strip USE TO CHECK BLOOD SUGAR UP TO TWICE DAILY     Accu-Chek Softclix Lancets lancets SMARTSIG:Topical 1-2 Times Daily     albuterol (VENTOLIN HFA) 108 (90 Base) MCG/ACT inhaler Inhale 2 puffs into the lungs every 6 (six)  hours as needed for wheezing or shortness of breath. 8 g 2   Cholecalciferol (VITAMIN D3) 125 MCG (5000 UT) CAPS Take 1 capsule (5,000 Units total) by mouth daily. 90 capsule 1   hydrOXYzine (VISTARIL) 25 MG capsule Take 1 capsule (25 mg total) by mouth every 8 (eight) hours as needed. 30 capsule 0   labetalol (NORMODYNE) 200 MG tablet Take 1 tablet (200 mg total) by mouth 2 (two) times daily. 90 tablet 3   LINZESS 290 MCG CAPS capsule TAKE 1 CAPSULE (290 MCG TOTAL) BY  MOUTH DAILY BEFORE BREAKFAST. 90 capsule 3   meloxicam (MOBIC) 15 MG tablet Take 15 mg by mouth daily.     metFORMIN (GLUCOPHAGE) 1000 MG tablet Take 1 tablet (1,000 mg total) by mouth 2 (two) times daily with a meal. 180 tablet 3   naproxen (NAPROSYN) 500 MG tablet Take 500 mg by mouth 2 (two) times a day.     Omega 3 1000 MG CAPS Take 2 capsules (2,000 mg total) by mouth daily. 180 capsule 2   omeprazole (PRILOSEC) 20 MG capsule Take 1 capsule (20 mg total) by mouth 2 (two) times daily before a meal. 180 capsule 2   pravastatin (PRAVACHOL) 40 MG tablet TAKE 1 TABLET BY MOUTH EACH DAY FOR STOMACH ACID OR REFLUX     pravastatin (PRAVACHOL) 80 MG tablet Take 1 tablet (80 mg total) by mouth daily. 90 tablet 3   traZODone (DESYREL) 50 MG tablet Take 1 tablet (50 mg total) by mouth at bedtime. 30 tablet 3   azithromycin (ZITHROMAX) 250 MG tablet Take 2 tablets on day 1, then 1 tablet daily on days 2 through 5 (Patient not taking: Reported on 01/08/2023) 6 tablet 0   benzonatate (TESSALON) 200 MG capsule Take 1 capsule (200 mg total) by mouth 2 (two) times daily as needed for cough. (Patient not taking: Reported on 01/08/2023) 20 capsule 0   No current facility-administered medications for this visit.    Allergies as of 01/08/2023 - Review Complete 01/08/2023  Allergen Reaction Noted   Bee venom Anaphylaxis 11/04/2010   Tramadol Hives 11/04/2010   Asa [aspirin] Other (See Comments) 08/14/2015   Latex Itching 12/26/2018   Peppermint flavoring agent (non-screening) Other (See Comments) 03/01/2018    Family History  Adopted: Yes  Problem Relation Age of Onset   Diabetes Daughter    Heart disease Other    Colon cancer Neg Hx     Social History   Socioeconomic History   Marital status: Significant Other    Spouse name: Not on file   Number of children: Not on file   Years of education: Not on file   Highest education level: Not on file  Occupational History   Not on file  Tobacco Use    Smoking status: Every Day    Current packs/day: 1.00    Average packs/day: 1 pack/day for 31.0 years (31.0 ttl pk-yrs)    Types: Cigarettes   Smokeless tobacco: Never  Vaping Use   Vaping status: Never Used  Substance and Sexual Activity   Alcohol use: Yes    Comment: occ   Drug use: No   Sexual activity: Yes    Birth control/protection: Surgical    Comment: tubal  Other Topics Concern   Not on file  Social History Narrative   Not on file   Social Drivers of Health   Financial Resource Strain: Low Risk  (08/03/2022)   Overall Financial Resource Strain (CARDIA)  Difficulty of Paying Living Expenses: Not hard at all  Food Insecurity: No Food Insecurity (08/03/2022)   Hunger Vital Sign    Worried About Running Out of Food in the Last Year: Never true    Ran Out of Food in the Last Year: Never true  Transportation Needs: No Transportation Needs (08/03/2022)   PRAPARE - Administrator, Civil Service (Medical): No    Lack of Transportation (Non-Medical): No  Physical Activity: Sufficiently Active (07/01/2021)   Exercise Vital Sign    Days of Exercise per Week: 7 days    Minutes of Exercise per Session: 30 min  Stress: No Stress Concern Present (08/03/2022)   [employer] of Occupational Health - Occupational Stress Questionnaire    Feeling of Stress : Not at all  Social Connections: Moderately Integrated (08/03/2022)   Social Connection and Isolation Panel [NHANES]    Frequency of Communication with Friends and Family: More than three times a week    Frequency of Social Gatherings with Friends and Family: More than three times a week    Attends Religious Services: More than 4 times per year    Active Member of [employer] or Organizations: No    Attends Banker Meetings: Never    Marital Status: Married    Review of Systems: Gen: Denies fever, chills,  CV: Denies chest pain, palpitations. Resp: Denies dyspnea, cough.  GI: See HPI Heme:  See  HPI  Physical Exam: BP 137/80 (BP Location: Right Arm, Patient Position: Sitting, Cuff Size: Large)   Pulse 98   Temp 97.9 F (36.6 C) (Temporal)   Ht 5\' 4"  (1.626 m)   Wt 259 lb (117.5 kg)   LMP 11/11/2016   BMI 44.46 kg/m  General:   Alert and oriented. No distress noted. Pleasant and cooperative.  Head:  Normocephalic and atraumatic. Eyes:  Conjuctiva clear without scleral icterus. Rectal: External hemorrhoids, there is a small external hemorrhoid in the 7 o clock position that is a bit more firm and may be in the process of becoming thrombosed.  Msk:  Symmetrical without gross deformities. Normal posture. Neurologic:  Alert and  oriented x4 Psych:  Normal mood and affect.    Assessment:  57 y.o. female with history of constipation, GERD, dysphagia, rectal bleeding due to hemorrhoids, presenting today due to single episode of scant rectal bleeding 3 days ago and now with what appears to be a small moderately firm external hemorrhoid that may be becoming thrombosed. Recommended supportive measures for now.   Notably, chronic constipation not adequately managed with Linzess 290 mcg daily.  She is having small, unproductive bowel movements every morning.  Recommended adding MiraLAX 1-2 times daily.  If ongoing symptoms, can try different prescriptive agent.   Plan:  Hydrocortisone rectal cream twice daily x 7 days. Sitz bath's 2-3 times a day. Limit toilet time to 2 to 3 minutes. Avoid straining. Continue Linzess 290 mcg daily. Add MiraLAX 17 g 1-2 times daily. Patient will let me know if constipation continues or rectal discomfort/hemorrhoid symptoms continue. Follow-up in 3 months or sooner if needed.   Ermalinda Memos, PA-C [facility] 01/08/2023

## 2023-01-08 ENCOUNTER — Ambulatory Visit (INDEPENDENT_AMBULATORY_CARE_PROVIDER_SITE_OTHER): Payer: Medicare HMO | Admitting: Gastroenterology

## 2023-01-08 ENCOUNTER — Encounter: Payer: Self-pay | Admitting: Gastroenterology

## 2023-01-08 VITALS — BP 137/80 | HR 98 | Temp 97.9°F | Ht 64.0 in | Wt 259.0 lb

## 2023-01-08 DIAGNOSIS — K5900 Constipation, unspecified: Secondary | ICD-10-CM

## 2023-01-08 DIAGNOSIS — K5909 Other constipation: Secondary | ICD-10-CM | POA: Diagnosis not present

## 2023-01-08 DIAGNOSIS — K644 Residual hemorrhoidal skin tags: Secondary | ICD-10-CM | POA: Diagnosis not present

## 2023-01-08 DIAGNOSIS — K625 Hemorrhage of anus and rectum: Secondary | ICD-10-CM | POA: Diagnosis not present

## 2023-01-08 DIAGNOSIS — K649 Unspecified hemorrhoids: Secondary | ICD-10-CM

## 2023-01-08 NOTE — Patient Instructions (Addendum)
For hemorrhoids:  Apply hydrocortisone rectal cream twice daily for the next 7 days.  Sitz baths 2-3 times a day Limit toilet time to 2-3 minutes Avoid straining  For constipation:  Continue Linzess 290 mcg daily.  Add MiraLAX 17g daily in 8 oz of water. You can increase this to twice daily if needed.  If constipation continues please let me know.   I will plan to see you back in 3 months for routine follow-up but call if you need to be seen sooner.   It was great to see you today!   Ermalinda Memos, PA-C [facility]

## 2023-01-22 NOTE — Patient Instructions (Signed)
        Great to see you today.  I have refilled the medication(s) we provide.    Lung Cancer Screening: Call our Nurse Navigator at [phone].    If labs were collected, we will inform you of lab results once received either by echart message or telephone call.   - echart message- for normal results that have been seen by the patient already.   - telephone call: abnormal results or if patient has not viewed results in their echart.   - Please take medications as prescribed. - Follow up with your primary health provider if any health concerns arises. - If symptoms worsen please contact your primary care provider and/or visit the emergency department.

## 2023-01-22 NOTE — Progress Notes (Unsigned)
   Established Patient Office Visit   Subjective  Patient ID: Autumn [name], female    DOB: January 14, 1966  Age: 57 y.o. MRN: [id number]  No chief complaint on file.   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  HPI  ROS    Objective:     LMP 11/11/2016  {Vitals History (Optional):23777}  Physical Exam   No results found for any visits on 01/25/23.  The 10-year ASCVD risk score (Arnett DK, et al., 2019) is: 28.9%    Assessment & Plan:  There are no diagnoses linked to this encounter.  No follow-ups on file.   Cruzita Lederer Newman Nip, FNP

## 2023-01-25 ENCOUNTER — Telehealth: Payer: Self-pay | Admitting: Family Medicine

## 2023-01-25 ENCOUNTER — Encounter: Payer: Self-pay | Admitting: Family Medicine

## 2023-01-25 ENCOUNTER — Ambulatory Visit (INDEPENDENT_AMBULATORY_CARE_PROVIDER_SITE_OTHER): Payer: Medicare HMO | Admitting: Family Medicine

## 2023-01-25 VITALS — BP 123/76 | HR 96 | Ht 64.0 in | Wt 260.0 lb

## 2023-01-25 DIAGNOSIS — I10 Essential (primary) hypertension: Secondary | ICD-10-CM

## 2023-01-25 DIAGNOSIS — E1169 Type 2 diabetes mellitus with other specified complication: Secondary | ICD-10-CM

## 2023-01-25 DIAGNOSIS — Z122 Encounter for screening for malignant neoplasm of respiratory organs: Secondary | ICD-10-CM

## 2023-01-25 DIAGNOSIS — Z7984 Long term (current) use of oral hypoglycemic drugs: Secondary | ICD-10-CM | POA: Diagnosis not present

## 2023-01-25 DIAGNOSIS — E119 Type 2 diabetes mellitus without complications: Secondary | ICD-10-CM | POA: Insufficient documentation

## 2023-01-25 MED ORDER — GABAPENTIN 300 MG PO CAPS: 300.0000 mg | ORAL_CAPSULE | Freq: Every day | ORAL | 3 refills | Status: DC

## 2023-01-25 MED ORDER — TRAZODONE HCL 100 MG PO TABS: 100.0000 mg | ORAL_TABLET | Freq: Every day | ORAL | 3 refills | Status: DC

## 2023-01-25 NOTE — Telephone Encounter (Signed)
Copied from CRM [id number]. Topic: General - Other >> Jan 25, 2023 10:27 AM Antony Haste wrote: Reason for CRM: PT states she was informed to contact cancer center for lung screening, she states when spoke with them, they requested to have PCP contact them instead for scheduling purposes.

## 2023-01-25 NOTE — Assessment & Plan Note (Signed)
Vitals:   01/25/23 0846  BP: 123/76  Blood pressure controlled in today's visit Patient reported on Labetalol 200 mg twice daily  Labs ordered. Discussed with  patient to monitor their blood pressure regularly and maintain a heart-healthy diet rich in fruits, vegetables, whole grains, and low-fat dairy, while reducing sodium intake to less than 2,300 mg per day. Regular physical activity, such as 30 minutes of moderate exercise most days of the week, will help lower blood pressure and improve overall cardiovascular health. Avoiding smoking, limiting alcohol consumption, and managing stress. Take  prescribed medication, & take it as directed and avoid skipping doses. Seek emergency care if your blood pressure is (over 180/100) or you experience chest pain, shortness of breath, or sudden vision changes.Patient verbalizes understanding regarding plan of care and all questions answered.

## 2023-01-25 NOTE — Assessment & Plan Note (Signed)
Last Hemoglobin A1c: 7.2 Labs: Ordered today, results pending; will follow up accordingly. The patient reports adhering to prescribed medications: Metformin 1000 mg twice daily  Reviewed non-pharmacological interventions, including a balanced diet rich in lean proteins, healthy fats, whole grains, and high-fiber vegetables. Emphasized reducing refined sugars and processed carbohydrates, and incorporating more fruits, leafy greens, and legumes. Education: Patient was educated on recognizing signs and symptoms of both hypoglycemia and hyperglycemia, and advised to seek emergency care if these symptoms occur. Follow-Up: Scheduled for follow-up in 3-4 months, or sooner if needed. Patient Understanding: The patient verbalized understanding of the care plan, and all questions were answered. Additional Care: Ophthalmology referral was placed. Foot exam results were within normal limits.

## 2023-01-26 ENCOUNTER — Telehealth: Payer: Self-pay | Admitting: Family Medicine

## 2023-01-26 ENCOUNTER — Ambulatory Visit: Payer: Self-pay | Admitting: Family Medicine

## 2023-01-26 LAB — LIPID PANEL
Chol/HDL Ratio: 3.2 {ratio} (ref 0.0–4.4)
Cholesterol, Total: 171 mg/dL (ref 100–199)
HDL: 53 mg/dL (ref >39)
LDL Chol Calc (NIH): 95 mg/dL (ref 0–99)
Triglycerides: 133 mg/dL (ref 0–149)
VLDL Cholesterol Cal: 23 mg/dL (ref 5–40)

## 2023-01-26 LAB — CBC WITH DIFFERENTIAL/PLATELET
Basophils Absolute: 0.1 10*3/uL (ref 0.0–0.2)
Basos: 1 %
EOS (ABSOLUTE): 0.3 10*3/uL (ref 0.0–0.4)
Eos: 4 %
Hematocrit: 37.7 % (ref 34.0–46.6)
Hemoglobin: 11.5 g/dL (ref 11.1–15.9)
Immature Grans (Abs): 0 10*3/uL (ref 0.0–0.1)
Immature Granulocytes: 0 %
Lymphocytes Absolute: 2.6 10*3/uL (ref 0.7–3.1)
Lymphs: 38 %
MCH: 21.9 pg — ABNORMAL LOW (ref 26.6–33.0)
MCHC: 30.5 g/dL — ABNORMAL LOW (ref 31.5–35.7)
MCV: 72 fL — ABNORMAL LOW (ref 79–97)
Monocytes Absolute: 0.7 10*3/uL (ref 0.1–0.9)
Monocytes: 10 %
Neutrophils Absolute: 3.3 10*3/uL (ref 1.4–7.0)
Neutrophils: 47 %
Platelets: 445 10*3/uL (ref 150–450)
RBC: 5.24 x10E6/uL (ref 3.77–5.28)
RDW: 15.6 % — ABNORMAL HIGH (ref 11.7–15.4)
WBC: 7 10*3/uL (ref 3.4–10.8)

## 2023-01-26 LAB — BMP8+EGFR
BUN/Creatinine Ratio: 11 (ref 9–23)
BUN: 12 mg/dL (ref 6–24)
CO2: 22 mmol/L (ref 20–29)
Calcium: 10.1 mg/dL (ref 8.7–10.2)
Chloride: 103 mmol/L (ref 96–106)
Creatinine, Ser: 1.06 mg/dL — ABNORMAL HIGH (ref 0.57–1.00)
Glucose: 122 mg/dL — ABNORMAL HIGH (ref 70–99)
Potassium: 4.5 mmol/L (ref 3.5–5.2)
Sodium: 140 mmol/L (ref 134–144)
eGFR: 62 mL/min/{1.73_m2} (ref >59)

## 2023-01-26 LAB — HEMOGLOBIN A1C
Est. average glucose Bld gHb Est-mCnc: 151 mg/dL
Hgb A1c MFr Bld: 6.9 % — ABNORMAL HIGH (ref 4.8–5.6)

## 2023-01-26 NOTE — Telephone Encounter (Signed)
  Chief Complaint: Medication Side Effects Symptoms: Dizziness, body aches Frequency: Since this AM Pertinent Negatives: Patient denies N/V Disposition: [] ED /[] Urgent Care (no appt availability in office) / [] Appointment(In office/virtual)/ []  [place] [facility]/ [x] Home Care/ [] Refused Recommended Disposition /[] [place] [facility]/ [x]  Follow-up with PCP Additional Notes: Pt reports she took first dose of Trazadone 100 mg last evening at HS, she fell asleep but woke approx 30 min later, she took one Gabapentin  300 mg to see if it would help her sleep. She did fall asleep but notes waking this morning with severe body aches, dizziness when ambulating. Pt reports she feels this is related to the new medications and is not sure she would like to continue. This RN educated pt on increasing water  intake today, resting and taking safety precautions with dizziness. Also provided education on when to call back or seek emergency care with new or worsening symptoms. Pt verbalized understanding and agrees to plan. Forwarding to provider for review.   Copied from CRM [id number]. Topic: Clinical - Red Word Triage >> Jan 26, 2023  9:45 AM Alfonso ORN wrote: Red Word that prompted transfer to Nurse Triage:   was put on medications : gabapentin  300 mg, traSODone 100 mg   having side effect dizziness, and lightheadness  ,feeling jittering. Reason for Disposition  [1] Caller has NON-URGENT medicine question about med that PCP prescribed AND [2] triager unable to answer question  Answer Assessment - Initial Assessment Questions 1. NAME of MEDICINE: What medicine(s) are you calling about?     Gabapentin  300 mg, Trazadone 100 mg 2. QUESTION: What is your question? (e.g., double dose of medicine, side effect)     Pt believes she is having side effects from one or both of the new medications she's started 3. PRESCRIBER: Who prescribed the medicine? Reason: if prescribed by specialist, call should be  referred to that group.     PCP 4. SYMPTOMS: Do you have any symptoms? If Yes, ask: What symptoms are you having?  How bad are the symptoms (e.g., mild, moderate, severe)     Body aches upon waking this morning, jittery, dizzy when walking, has to hold onto things when walking  Protocols used: Medication Question Call-A-AH

## 2023-01-26 NOTE — Progress Notes (Signed)
Please inform patient,  Hemoglobin A1c 6.9 Type 2 diabetes controlled, kidney function within normal limits Continue  Metformin 1000 mg twice daily  Referral was placed for lung cancer screening.

## 2023-01-26 NOTE — Telephone Encounter (Signed)
Patient was a no show, she can call the office back to reschedule this appointment.  Patient cancel her CT as well due to her insurance.

## 2023-01-26 NOTE — Telephone Encounter (Signed)
See message below  Patient was a no show for that appointment she can contact their office to reschedule that appointment, she did not get her CT scan done either.

## 2023-01-26 NOTE — Addendum Note (Signed)
Addended by: Rica Records on: 01/26/2023 08:15 AM   Modules accepted: Orders

## 2023-01-26 NOTE — Telephone Encounter (Signed)
Please let patient know just take gabapentin alone without Trazodone

## 2023-01-26 NOTE — Telephone Encounter (Signed)
 Copied from CRM [id number]. Topic: Clinical - Prescription Issue >> Jan 26, 2023  9:42 AM Autumn [name] wrote: Reason for CRM: seen Autumn [name] on 01/25/23 and was put on medications : gabapentin  300 mg, traSODone 100 mg   having side effect dizziness, and lightheadness  ,feeling jittering. Was on the tradmol 50mg  do not want to take not helping

## 2023-02-02 ENCOUNTER — Telehealth: Payer: Self-pay

## 2023-02-02 ENCOUNTER — Other Ambulatory Visit: Payer: Self-pay | Admitting: Family Medicine

## 2023-02-02 MED ORDER — DOXEPIN HCL 25 MG PO CAPS: 25.0000 mg | ORAL_CAPSULE | Freq: Every evening | ORAL | 2 refills | Status: DC

## 2023-02-02 NOTE — Telephone Encounter (Signed)
Tried calling pt unable to reach her. 

## 2023-02-02 NOTE — Telephone Encounter (Signed)
 Copied from CRM [id number]. Topic: Clinical - Lab/Test Results >> Feb 02, 2023  1:26 PM Autumn [name] wrote: Reason for CRM: Patient would like to receive a call regarding kidney paperwork that she was given//Also she received a call from the office but there aren't any notes in her chart//

## 2023-02-02 NOTE — Telephone Encounter (Signed)
 Sent Doxepin 25 mg at bedtime

## 2023-02-02 NOTE — Telephone Encounter (Signed)
 A kidney-friendly diet focuses on reducing the strain on your kidneys by limiting certain nutrients like sodium, potassium, and phosphorus, while ensuring adequate protein and other essential nutrients. This diet is often recommended for individuals with chronic kidney disease (CKD) or other kidney-related issues.  Key Principles of a Kidney-Friendly Diet Sodium Restriction  Why: Too much sodium can raise blood pressure and worsen kidney damage. Limit: Aim for less than 2,300 mg of sodium per day. Tips: Avoid processed foods, canned soups, and salty snacks. Use fresh herbs and spices instead of salt for flavoring. Potassium Management  Why: High potassium levels can lead to irregular heart rhythms if the kidneys cannot regulate it. Limit: Depending on your lab results, your healthcare provider will set a specific potassium limit. Low-Potassium Foods: Apples, berries, grapes, cabbage, cauliflower, white rice. High-Potassium Foods to Avoid: Bananas, oranges, tomatoes, potatoes, spinach. Phosphorus Control  Why: Excess phosphorus can weaken bones and harm blood vessels. Limit: 800-1,000 mg/day for those with kidney disease. Low-Phosphorus Foods: Fresh fruits, vegetables, rice milk, white bread. High-Phosphorus Foods to Avoid: Dairy products, nuts, seeds, colas, processed meats. Protein Intake  Why: Too much protein can stress the kidneys, but too little can lead to muscle loss. Goal: Moderate protein intake; specific needs depend on your stage of CKD. Low-Protein Foods: Vegetables, fruits, grains. Healthy Protein Choices: Lean meats, fish, eggs, tofu (if phosphorus levels are monitored). Fluid Intake  Why: Excess fluid can cause swelling, high blood pressure, and strain on the heart. Limit: Based on your condition, your provider may recommend a specific fluid limit. Tips: Count water  in soups, ice, and beverages. Kidney-Friendly Foods Vegetables: Bell peppers, cabbage, cauliflower,  garlic, onions, zucchini. Fruits: Apples, blueberries, cranberries, grapes, strawberries. Proteins: Egg whites, chicken, fish (in moderation). Grains: White rice, pasta, bread. Fats: Olive oil, unsalted butter. Foods to Avoid Processed foods (chips, canned goods with added sodium). Dark sodas (high in phosphorus). Dairy products (milk, cheese, yogurt). High-potassium fruits/vegetables (bananas, potatoes, avocados). Red meats in excess. Meal Planning Tips Read Labels: Check for sodium, potassium, and phosphorus. Cook Fresh: Prepare meals from scratch to control ingredients. Use Leaching: Soak high-potassium vegetables (e.g., potatoes) in water  to reduce potassium levels.

## 2023-02-03 NOTE — Telephone Encounter (Signed)
 Contacted pt. To give results. No answer. Lvm for pt to  contact office back for results

## 2023-02-03 NOTE — Telephone Encounter (Signed)
 Called pt. Read thru the providers diet examples and CKD diet friendly examples. P.t requested notes to be mailed to her for examples of a friendly diet. MA notified pt. That they will mail them out Monday.

## 2023-02-03 NOTE — Telephone Encounter (Signed)
 P.t has been notified and will pick up medication today.

## 2023-02-04 ENCOUNTER — Ambulatory Visit: Payer: Self-pay

## 2023-02-04 NOTE — Telephone Encounter (Signed)
 Copied from CRM [id number]. Topic: Clinical - Lab/Test Results >> Feb 03, 2023 12:13 PM Carlatta H wrote: Reason for CRM: Please call patient back for test results//

## 2023-02-04 NOTE — Telephone Encounter (Signed)
 Msg sent to [facility]. Not our patient.

## 2023-02-04 NOTE — Telephone Encounter (Signed)
Attempted to return call. lvm

## 2023-02-07 ENCOUNTER — Other Ambulatory Visit: Payer: Self-pay | Admitting: Family Medicine

## 2023-02-07 DIAGNOSIS — G8929 Other chronic pain: Secondary | ICD-10-CM

## 2023-02-08 NOTE — Telephone Encounter (Signed)
 Letter w/ physician information has been mailed to pt.

## 2023-03-16 ENCOUNTER — Telehealth: Payer: Self-pay | Admitting: Family Medicine

## 2023-03-16 NOTE — Telephone Encounter (Signed)
Placard  Noted  Copied Sleeved  Original in PCP box Copy front desk folder

## 2023-03-18 ENCOUNTER — Other Ambulatory Visit: Payer: Self-pay | Admitting: Family Medicine

## 2023-03-23 ENCOUNTER — Encounter: Payer: Self-pay | Admitting: Gastroenterology

## 2023-03-26 NOTE — Telephone Encounter (Signed)
 Spoke with patient advised forms ready for pick up at front desk

## 2023-03-30 ENCOUNTER — Telehealth: Payer: Self-pay | Admitting: Family Medicine

## 2023-03-30 NOTE — Telephone Encounter (Unsigned)
 Copied from CRM [id number]. Topic: General - Call Back - No Documentation >> Mar 30, 2023 11:41 AM Higinio Roger wrote: Reason for CRM: Patient would like a callback to discuss getting paperwork for her tag mailed to her. Patient does not have transportation at the moment. Callback #: [phone]

## 2023-04-01 NOTE — Telephone Encounter (Signed)
 Called and spoke with patient she is aware the front staff will mail handicap for for her.

## 2023-04-25 ENCOUNTER — Other Ambulatory Visit: Payer: Self-pay | Admitting: Family Medicine

## 2023-05-10 ENCOUNTER — Other Ambulatory Visit: Payer: Self-pay | Admitting: Family Medicine

## 2023-05-10 DIAGNOSIS — I10 Essential (primary) hypertension: Secondary | ICD-10-CM

## 2023-05-20 ENCOUNTER — Telehealth: Payer: Self-pay | Admitting: Family Medicine

## 2023-05-20 NOTE — Telephone Encounter (Signed)
 Looks like pt uses [facility]. She would need to call in and confirm that she wanted meds sent to them before they are sent

## 2023-05-20 NOTE — Telephone Encounter (Signed)
 Copied from CRM [id number]. Topic: Clinical - Prescription Issue >> May 20, 2023  2:27 PM Lotus Round B wrote: Reason for CRM: john from [facility] called in to get prescriptions for the patient . He stated the first fax he sent to the pharmacy was on 4/15 and never got a response back . The medications are    Omega 3 1000 MG CAPS  doxepin  (SINEQUAN ) 25 MG capsule  pravastatin  (PRAVACHOL ) 80 MG tablet  labetalol  (NORMODYNE ) 200 MG tablet

## 2023-05-24 ENCOUNTER — Other Ambulatory Visit: Payer: Self-pay | Admitting: Family Medicine

## 2023-05-24 NOTE — Telephone Encounter (Signed)
 Copied from CRM [id number]. Topic: Clinical - Medication Refill >> May 24, 2023  9:48 AM Chuck Crater wrote: Most Recent Primary Care Visit:  Provider: Rosanna Comment  Department: [facility]  Visit Type: OFFICE VISIT  Date: 01/25/2023  Medication: Omega 3 1000 MG CAPS  Has the patient contacted their pharmacy? Yes (Agent: If no, request that the patient contact the pharmacy for the refill. If patient does not wish to contact the pharmacy document the reason why and proceed with request.) (Agent: If yes, when and what did the pharmacy advise?) stated they haven't heard anything from clinic  Is this the correct pharmacy for this prescription? Yes If no, delete pharmacy and type the correct one.  This is the patient's preferred pharmacy:  [facility] - [place], [place] - [address] [place] [address] Phone: [phone] Fax: [phone]   Has the prescription been filled recently? No  Is the patient out of the medication? Yes  Has the patient been seen for an appointment in the last year OR does the patient have an upcoming appointment? Yes  Can we respond through MyChart? No  Agent: Please be advised that Rx refills may take up to 3 business days. We ask that you follow-up with your pharmacy.

## 2023-05-25 MED ORDER — OMEGA 3 1000 MG PO CAPS: 2.0000 | ORAL_CAPSULE | Freq: Every day | ORAL | 2 refills | Status: DC

## 2023-05-25 NOTE — Telephone Encounter (Signed)
 Last Fill: 10/21/22  Last OV: 01/25/23 Next OV: 05/26/23  Routing to provider for review/authorization.

## 2023-05-26 ENCOUNTER — Ambulatory Visit (INDEPENDENT_AMBULATORY_CARE_PROVIDER_SITE_OTHER): Payer: Medicare HMO | Admitting: Family Medicine

## 2023-05-26 ENCOUNTER — Other Ambulatory Visit: Payer: Self-pay | Admitting: *Deleted

## 2023-05-26 ENCOUNTER — Encounter: Payer: Self-pay | Admitting: Family Medicine

## 2023-05-26 ENCOUNTER — Telehealth: Payer: Self-pay | Admitting: *Deleted

## 2023-05-26 VITALS — BP 124/82 | HR 95 | Ht 64.0 in | Wt 264.1 lb

## 2023-05-26 DIAGNOSIS — R52 Pain, unspecified: Secondary | ICD-10-CM

## 2023-05-26 DIAGNOSIS — E1159 Type 2 diabetes mellitus with other circulatory complications: Secondary | ICD-10-CM | POA: Diagnosis not present

## 2023-05-26 DIAGNOSIS — I10 Essential (primary) hypertension: Secondary | ICD-10-CM | POA: Diagnosis not present

## 2023-05-26 DIAGNOSIS — M25512 Pain in left shoulder: Secondary | ICD-10-CM

## 2023-05-26 DIAGNOSIS — F5102 Adjustment insomnia: Secondary | ICD-10-CM | POA: Diagnosis not present

## 2023-05-26 DIAGNOSIS — Z122 Encounter for screening for malignant neoplasm of respiratory organs: Secondary | ICD-10-CM

## 2023-05-26 DIAGNOSIS — Z87891 Personal history of nicotine dependence: Secondary | ICD-10-CM

## 2023-05-26 DIAGNOSIS — G8929 Other chronic pain: Secondary | ICD-10-CM

## 2023-05-26 DIAGNOSIS — E038 Other specified hypothyroidism: Secondary | ICD-10-CM | POA: Diagnosis not present

## 2023-05-26 DIAGNOSIS — M25511 Pain in right shoulder: Secondary | ICD-10-CM

## 2023-05-26 DIAGNOSIS — F17210 Nicotine dependence, cigarettes, uncomplicated: Secondary | ICD-10-CM

## 2023-05-26 DIAGNOSIS — F32A Depression, unspecified: Secondary | ICD-10-CM

## 2023-05-26 DIAGNOSIS — Z21 Asymptomatic human immunodeficiency virus [HIV] infection status: Secondary | ICD-10-CM

## 2023-05-26 DIAGNOSIS — Z7984 Long term (current) use of oral hypoglycemic drugs: Secondary | ICD-10-CM | POA: Diagnosis not present

## 2023-05-26 DIAGNOSIS — Z113 Encounter for screening for infections with a predominantly sexual mode of transmission: Secondary | ICD-10-CM

## 2023-05-26 DIAGNOSIS — F419 Anxiety disorder, unspecified: Secondary | ICD-10-CM

## 2023-05-26 DIAGNOSIS — G4700 Insomnia, unspecified: Secondary | ICD-10-CM | POA: Insufficient documentation

## 2023-05-26 DIAGNOSIS — E119 Type 2 diabetes mellitus without complications: Secondary | ICD-10-CM | POA: Diagnosis not present

## 2023-05-26 DIAGNOSIS — E559 Vitamin D deficiency, unspecified: Secondary | ICD-10-CM

## 2023-05-26 DIAGNOSIS — M25519 Pain in unspecified shoulder: Secondary | ICD-10-CM | POA: Insufficient documentation

## 2023-05-26 MED ORDER — BUSPIRONE HCL 15 MG PO TABS: 15.0000 mg | ORAL_TABLET | Freq: Two times a day (BID) | ORAL | 2 refills | Status: AC | PRN

## 2023-05-26 MED ORDER — ESCITALOPRAM OXALATE 10 MG PO TABS: 10.0000 mg | ORAL_TABLET | Freq: Every day | ORAL | 3 refills | Status: DC

## 2023-05-26 MED ORDER — ZOLPIDEM TARTRATE 5 MG PO TABS: 5.0000 mg | ORAL_TABLET | Freq: Every evening | ORAL | 0 refills | Status: DC | PRN

## 2023-05-26 MED ORDER — OMEGA 3 1000 MG PO CAPS
2.0000 | ORAL_CAPSULE | Freq: Every day | ORAL | 2 refills | Status: AC
Start: 2023-05-26 — End: 2024-02-20

## 2023-05-26 MED ORDER — VITAMIN D3 125 MCG (5000 UT) PO CAPS: 5000.0000 [IU] | ORAL_CAPSULE | Freq: Every day | ORAL | 1 refills | Status: AC
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
## 2023-05-26 NOTE — Assessment & Plan Note (Signed)
    05/26/2023   10:07 AM 10/20/2022    8:15 AM 10/20/2022    8:10 AM 10/16/2022    9:04 AM  GAD 7 : Generalized Anxiety Score  Nervous, Anxious, on Edge 1 0 0 0  Control/stop worrying 3 3 0 3  Worry too much - different things 3 3 0 3  Trouble relaxing 3 3 0 3  Restless 3 0 0 2  Easily annoyed or irritable 3 0 0 0  Afraid - awful might happen 3 0 0 3  Total GAD 7 Score 19 9 0 14  Anxiety Difficulty Not difficult at all Somewhat difficult Not difficult at all Very difficult     Flowsheet Row Office Visit from 05/26/2023 in [facility]  PHQ-9 Total Score 6      Trial Lexaprol 10 mg once daily and Buspar 15 mg twice daily We discussed several non-pharmacological approaches to managing anxiety and depression, including:  Establishing a consistent daily routine: This helps create structure and stability. Practicing mindfulness and relaxation techniques: Incorporating meditation, deep breathing exercises, or yoga to manage stress and improve emotional well-being. Engaging in regular physical activity: Aim for at least 30 minutes of exercise most days to boost mood and energy levels. Spending time outdoors: Exposure to natural light and fresh air can improve mental health. Building a support network: Encouraging social connections with friends, family, or support groups to reduce feelings of isolation. Prioritizing a balanced diet: Eating nutrient-rich foods while avoiding excessive amounts of processed foods, sugar, and unhealthy fats. Follow-up is recommended in 4-8 weeks to assess progress, with a referral to behavioral health for further support if needed.

## 2023-05-26 NOTE — Telephone Encounter (Signed)
 Lung Cancer Screening Narrative/Criteria Questionnaire (Cigarette Smokers Only- No Cigars/Pipes/vapes)   Autumn [name]   SDMV:06/16/23 9:15- [place]                                           05-Apr-1965              LDCT: 06/18/23 10:30- AP    57 y.o.   Phone: [phone]  Lung Screening Narrative (confirm age 86-77 yrs Medicare / 50-80 yrs Private pay insurance)   Insurance information:[employer]   Referring Provider:Del Amber Bail   This screening involves an initial phone call with a team member from our program. It is called a shared decision making visit. The initial meeting is required by insurance and Medicare to make sure you understand the program. This appointment takes about 15-20 minutes to complete. The CT scan will completed at a separate date/time. This scan takes about 5-10 minutes to complete and you may eat and drink before and after the scan.  Criteria questions for Lung Cancer Screening:   Are you a current or former smoker? Current Age began smoking: 54   If you are a former smoker, what year did you quit smoking? (within 15 yrs)   To calculate your smoking history, I need an accurate estimate of how many packs of cigarettes you smoked per day and for how many years. (Not just the number of PPD you are now smoking)   Years smoking 39 x Packs per day 1/2 = Pack years 20   (at least 20 pack yrs)   (Make sure they understand that we need to know how much they have smoked in the past, not just the number of PPD they are smoking now)  Do you have a personal history of cancer?  No    Do you have a family history of cancer? No  Are you coughing up blood?  No  Have you had unexplained weight loss of 15 lbs or more in the last 6 months? No  It looks like you meet all criteria.     Additional information: N/A

## 2023-05-26 NOTE — Assessment & Plan Note (Signed)
 Xray ordered awaiting results I explained to the patient that non-pharmacological interventions include the application of ice or heat, rest, and recommended range of motion exercises along with gentle stretching. For pain management, Tylenol  was advised. The patient was instructed to follow up if symptoms worsen or persist. The patient verbalized understanding of the care plan, and all questions were answered.

## 2023-05-26 NOTE — Patient Instructions (Signed)

## 2023-05-26 NOTE — Assessment & Plan Note (Signed)
 Last Hemoglobin A1c: 6.9 Labs: Ordered today, results pending; will follow up accordingly. The patient reports adhering to prescribed medications: Metformin  1000 mg twice daily   Reviewed non-pharmacological interventions, including a balanced diet rich in lean proteins, healthy fats, whole grains, and high-fiber vegetables. Emphasized reducing refined sugars and processed carbohydrates, and incorporating more fruits, leafy greens, and legumes. Education: Patient was educated on recognizing signs and symptoms of both hypoglycemia and hyperglycemia, and advised to seek emergency care if these symptoms occur. Follow-Up: Scheduled for follow-up in 3-4 months, or sooner if needed. Patient Understanding: The patient verbalized understanding of the care plan, and all questions were answered. Additional Care: Ophthalmology referral was placed. Foot exam results were within normal limits.

## 2023-05-26 NOTE — Assessment & Plan Note (Signed)
 Failed on Trazdone, donepezil, gabapentin   Trial on Ambien 5 mg PRN at bedtime Explained to go to bed at the same time each night and get up at the same time each morning, including on the weekends. Make sure your bedroom is quiet, dark, relaxing, and at a comfortable temperature. Remove electronic devices, such as TVs, computers, and smart phones, from the bedroom.

## 2023-05-27 ENCOUNTER — Other Ambulatory Visit: Payer: Self-pay | Admitting: Family Medicine

## 2023-05-27 ENCOUNTER — Telehealth: Payer: Self-pay | Admitting: Family Medicine

## 2023-05-27 MED ORDER — GLIPIZIDE 10 MG PO TABS: 10.0000 mg | ORAL_TABLET | Freq: Every day | ORAL | 3 refills | Status: DC

## 2023-05-27 NOTE — Telephone Encounter (Unsigned)
 Copied from CRM [id number]. Topic: Clinical - Medication Question >> May 27, 2023  3:49 PM Rosaria Common wrote: Reason for CRM: Pt was prescribed glipiZIDE  (GLUCOTROL ) 10 MG tablet and would like clarity on dosage and instructions and why it was prescribed. Callback number is [phone]

## 2023-05-27 NOTE — Progress Notes (Signed)
 Please inform patient,   Hemoglobin A1c 7.5 increase from prior reading, Continue Metformin  1000 mg twice daily Start glipizide  10 mg once daily- medication sent to your pharmacy     Cholesterol levels elevated, start lifestyle modifications and follow diet low in saturated fat. Plan: Pravastatin  80 mg once daily  Limit: Saturated fats: Butter, cream, fatty meats. Trans fats: Fried foods, processed snacks. Sugar and refined carbs: Sweets, white bread. Focus on whole foods, healthy fats, and fiber to improve heart health! Maintain an exercise routine 3 to 5 days a week for a minimum total of 150 minutes.   Thyroid  panel normal.

## 2023-05-28 LAB — CBC WITH DIFFERENTIAL/PLATELET
Basophils Absolute: 0.1 10*3/uL (ref 0.0–0.2)
Basos: 1 %
EOS (ABSOLUTE): 0.2 10*3/uL (ref 0.0–0.4)
Eos: 3 %
Hematocrit: 39 % (ref 34.0–46.6)
Hemoglobin: 11.6 g/dL (ref 11.1–15.9)
Immature Grans (Abs): 0 10*3/uL (ref 0.0–0.1)
Immature Granulocytes: 0 %
Lymphocytes Absolute: 3.4 10*3/uL — ABNORMAL HIGH (ref 0.7–3.1)
Lymphs: 43 %
MCH: 21.8 pg — ABNORMAL LOW (ref 26.6–33.0)
MCHC: 29.7 g/dL — ABNORMAL LOW (ref 31.5–35.7)
MCV: 73 fL — ABNORMAL LOW (ref 79–97)
Monocytes Absolute: 0.9 10*3/uL (ref 0.1–0.9)
Monocytes: 11 %
Neutrophils Absolute: 3.3 10*3/uL (ref 1.4–7.0)
Neutrophils: 42 %
Platelets: 452 10*3/uL — ABNORMAL HIGH (ref 150–450)
RBC: 5.31 x10E6/uL — ABNORMAL HIGH (ref 3.77–5.28)
RDW: 14.7 % (ref 11.7–15.4)
WBC: 7.9 10*3/uL (ref 3.4–10.8)

## 2023-05-28 LAB — BMP8+EGFR
BUN/Creatinine Ratio: 13 (ref 9–23)
BUN: 12 mg/dL (ref 6–24)
CO2: 20 mmol/L (ref 20–29)
Calcium: 10.2 mg/dL (ref 8.7–10.2)
Chloride: 102 mmol/L (ref 96–106)
Creatinine, Ser: 0.91 mg/dL (ref 0.57–1.00)
Glucose: 118 mg/dL — ABNORMAL HIGH (ref 70–99)
Potassium: 4.8 mmol/L (ref 3.5–5.2)
Sodium: 139 mmol/L (ref 134–144)
eGFR: 75 mL/min/{1.73_m2} (ref >59)

## 2023-05-28 LAB — LIPID PANEL
Chol/HDL Ratio: 3.7 ratio (ref 0.0–4.4)
Cholesterol, Total: 192 mg/dL (ref 100–199)
HDL: 52 mg/dL (ref >39)
LDL Chol Calc (NIH): 113 mg/dL — ABNORMAL HIGH (ref 0–99)
Triglycerides: 156 mg/dL — ABNORMAL HIGH (ref 0–149)
VLDL Cholesterol Cal: 27 mg/dL (ref 5–40)

## 2023-05-28 LAB — HIV ANTIBODY (ROUTINE TESTING W REFLEX): HIV Screen 4th Generation wRfx: NONREACTIVE

## 2023-05-28 LAB — HEMOGLOBIN A1C
Est. average glucose Bld gHb Est-mCnc: 169 mg/dL
Hgb A1c MFr Bld: 7.5 % — ABNORMAL HIGH (ref 4.8–5.6)

## 2023-05-28 LAB — TSH+FREE T4
Free T4: 1.33 ng/dL (ref 0.82–1.77)
TSH: 1.1 u[IU]/mL (ref 0.450–4.500)

## 2023-05-28 LAB — ANA W/REFLEX: Anti Nuclear Antibody (ANA): NEGATIVE

## 2023-05-28 LAB — VITAMIN D 25 HYDROXY (VIT D DEFICIENCY, FRACTURES): Vit D, 25-Hydroxy: 77.6 ng/mL (ref 30.0–100.0)

## 2023-05-30 LAB — NUSWAB VAGINITIS PLUS (VG+)
Candida albicans, NAA: NEGATIVE
Candida glabrata, NAA: NEGATIVE

## 2023-05-31 ENCOUNTER — Ambulatory Visit: Payer: Self-pay

## 2023-05-31 NOTE — Telephone Encounter (Signed)
 Copied from CRM [id number]. Topic: Clinical - Red Word Triage >> May 31, 2023 10:59 AM Autumn [name] wrote: Red Word that prompted transfer to Nurse Triage: Patient calling in due she took her glipiZIDE  (GLUCOTROL ) 10 MG tablet and it dropped her sugar level to 30 and made her dizzy and she'[name] not taking it anymore..   Chief Complaint: Low blood glucose Symptoms: dizziness; BG 39 Frequency: intermittent Pertinent Negatives: Patient denies shortness of breath Disposition: [] ED /[] Urgent Care (no appt availability in office) / [] Appointment(In office/virtual)/ []  [facility]/ [] Home Care/ [] Refused Recommended Disposition /[] [place] [facility]/ []  Follow-up with PCP Additional Notes: Pt reports low BG levels over the weekend after starting Glipizide  3 days ago. BG dropped significantly to 39 at which point pt felt dizzy. Pt sts that she drank some OJ and her BG level came up. Pt sts that this morning her BG level was 109 (fasting).  RN advising patient to hold the glipizide  for now, eat, and continue to monitor her BG level. Care advice also give. Pt verbalized unstanding. Will need appt preferably within 24 hours, but next avail is on 5/7 with PCP (prescribing provider). Will route HP to clinic for follow-up instructions.   Reason for Disposition  [1] Caller has URGENT medication or insulin pump question AND [2] triager unable to answer question  Answer Assessment - Initial Assessment Questions 1. SYMPTOMS: "What symptoms are you concerned about?"     Dizziness  2. ONSET:  "When did the symptoms start?"     3 days ago  3. BLOOD GLUCOSE: "What is your blood glucose level?"      103, fasting  4. USUAL RANGE: "What is your blood glucose level usually?" (e.g., usual fasting morning value, usual evening value)     129  5. TYPE 1 or 2:  "Do you know what type of diabetes you have?"  (e.g., Type 1, Type 2, Gestational; doesn't know)      Type 2   6. INSULIN: "Do you take insulin?"  "What type of insulin([name]) do you use? What is the mode of delivery? (syringe, pen; injection or pump) "When did you last give yourself an insulin dose?" (i.e., time or hours/minutes ago) "How much did you give?" (i.e., how many units)     No  7. DIABETES PILLS: "Do you take any pills for your diabetes?" If Yes, ask: "What is the name of the medicine([name]) that you take for high blood sugar?"     Glipizide  and Metformin   8. OTHER SYMPTOMS: "Do you have any symptoms?" (e.g., fever, frequent urination, difficulty breathing, vomiting)     No  9. LOW BLOOD GLUCOSE TREATMENT: "What have you done so far to treat the low blood glucose level?"     Has drank some orange juice  10. FOOD: "When did you last eat or drink?"       This morning  11. ALONE: "Are you alone right now or is someone with you?"        Has friend with her presently  47. PREGNANCY: "Is there any chance you are pregnant?" "When was your last menstrual period?"       No  Protocols used: Diabetes - Low Blood Sugar-A-AH

## 2023-05-31 NOTE — Progress Notes (Signed)
 Please inform patient,   HIV labs negative, STD screening negative

## 2023-06-01 ENCOUNTER — Encounter: Payer: Self-pay | Admitting: Family Medicine

## 2023-06-02 ENCOUNTER — Encounter: Payer: Self-pay | Admitting: Family Medicine

## 2023-06-02 ENCOUNTER — Encounter: Admitting: Family Medicine

## 2023-06-02 NOTE — Progress Notes (Signed)
 Canceled no visit needed

## 2023-06-16 ENCOUNTER — Encounter: Payer: Self-pay | Admitting: Adult Health

## 2023-06-16 ENCOUNTER — Ambulatory Visit: Admitting: Adult Health

## 2023-06-16 DIAGNOSIS — F17210 Nicotine dependence, cigarettes, uncomplicated: Secondary | ICD-10-CM | POA: Diagnosis not present

## 2023-06-16 NOTE — Patient Instructions (Signed)

## 2023-06-16 NOTE — Progress Notes (Signed)
  Virtual Visit via Telephone Note  I connected with Autumn [name] , 06/16/23 8:48 AM by a telemedicine application and verified that I am speaking with the correct person using two identifiers.  Location: Patient: home Provider: home   I discussed the limitations of evaluation and management by telemedicine and the availability of in person appointments. The patient expressed understanding and agreed to proceed.   Shared Decision Making Visit Lung Cancer Screening Program [phone])   Eligibility: 58 y.o. Pack Years Smoking History Calculation = 20 pack years (# packs/per year x # years smoked) Recent History of coughing up blood  no Unexplained weight loss? no ( >Than 15 pounds within the last 6 months ) Prior History Lung / other cancer no (Diagnosis within the last 5 years already requiring surveillance chest CT Scans). Smoking Status Current Smoker   Visit Components: Discussion included one or more decision making aids. YES Discussion included risk/benefits of screening. YES Discussion included potential follow up diagnostic testing for abnormal scans. YES Discussion included meaning and risk of over diagnosis. YES Discussion included meaning and risk of False Positives. YES Discussion included meaning of total radiation exposure. YES  Counseling Included: Importance of adherence to annual lung cancer LDCT screening. YES Impact of comorbidities on ability to participate in the program. YES Ability and willingness to under diagnostic treatment. YES  Smoking Cessation Counseling: Current Smokers:  Discussed importance of smoking cessation. yes Information about tobacco cessation classes and interventions provided to patient. yes Patient provided with "ticket" for LDCT Scan. yes Symptomatic Patient. NO Diagnosis Code: Tobacco Use Z72.0 Asymptomatic Patient yes  Counseling - 4 minutes of smoking cessation counseling (CT Chest Lung Cancer Screening Low Dose W/O  CM) [id number]  Z12.2-Screening of respiratory organs Z87.891-Personal history of nicotine dependence   Cullen Dose 06/16/23

## 2023-06-17 ENCOUNTER — Other Ambulatory Visit: Payer: Self-pay | Admitting: Family Medicine

## 2023-06-17 MED ORDER — ACCU-CHEK SOFTCLIX LANCETS MISC: 0 refills | Status: AC

## 2023-06-17 NOTE — Telephone Encounter (Signed)
 Copied from CRM [id number]. Topic: Clinical - Medication Refill >> Jun 17, 2023  2:44 PM Rosaria Common wrote: Medication: ACCU-CHEK GUIDE test strip, and Accu-Chek Softclix Lancets lancets  Has the patient contacted their pharmacy? Yes (Agent: If no, request that the patient contact the pharmacy for the refill. If patient does not wish to contact the pharmacy document the reason why and proceed with request.) (Agent: If yes, when and what did the pharmacy advise?)  This is the patient's preferred pharmacy:  [facility] - [place], [place] - [address] [place] [address] Phone: [phone] Fax: [phone]  Is this the correct pharmacy for this prescription? Yes If no, delete pharmacy and type the correct one.   Has the prescription been filled recently? No  Is the patient out of the medication? Yes  Has the patient been seen for an appointment in the last year OR does the patient have an upcoming appointment? Yes  Can we respond through MyChart? Yes  Agent: Please be advised that Rx refills may take up to 3 business days. We ask that you follow-up with your pharmacy.

## 2023-06-17 NOTE — Telephone Encounter (Signed)
 Last Fill: Test Strips: 09/05/21     Lancets: 07/31/21  Last OV: 05/26/23 Next OV: 09/29/23  Routing to provider for review/authorization.

## 2023-06-18 ENCOUNTER — Other Ambulatory Visit (HOSPITAL_COMMUNITY): Payer: Self-pay | Admitting: Family Medicine

## 2023-06-18 ENCOUNTER — Ambulatory Visit (HOSPITAL_COMMUNITY)
Admission: RE | Admit: 2023-06-18 | Discharge: 2023-06-18 | Disposition: A | Discharge status: Home / Self Care | Source: Ambulatory Visit | Attending: Acute Care | Admitting: Acute Care

## 2023-06-18 DIAGNOSIS — Z122 Encounter for screening for malignant neoplasm of respiratory organs: Secondary | ICD-10-CM | POA: Diagnosis not present

## 2023-06-18 DIAGNOSIS — Z87891 Personal history of nicotine dependence: Secondary | ICD-10-CM | POA: Diagnosis not present

## 2023-06-18 DIAGNOSIS — F17210 Nicotine dependence, cigarettes, uncomplicated: Secondary | ICD-10-CM | POA: Diagnosis not present

## 2023-06-18 DIAGNOSIS — Z1231 Encounter for screening mammogram for malignant neoplasm of breast: Secondary | ICD-10-CM

## 2023-06-18 NOTE — Telephone Encounter (Signed)
 Patient is wanting test strips, they were not sent in.

## 2023-06-22 ENCOUNTER — Telehealth: Payer: Self-pay

## 2023-06-22 ENCOUNTER — Other Ambulatory Visit: Payer: Self-pay | Admitting: Family Medicine

## 2023-06-22 MED ORDER — ACCU-CHEK GUIDE ME W/DEVICE KIT: PACK | 1 refills | Status: AC

## 2023-06-22 NOTE — Telephone Encounter (Signed)
 Copied from CRM [id number]. Topic: Clinical - Prescription Issue >> Jun 18, 2023  4:41 PM Autumn [name] wrote: Reason for CRM: Prescription was sent to the pharmacy for test strips and Lancets and there was only Lancets at the pharmacy ready for pick up and no test strips the Pt is out of strips and checks it 3 times a day and have not gotten a chance to check it today  Pt call back number [phone] (M)

## 2023-06-22 NOTE — Telephone Encounter (Signed)
 sent

## 2023-06-24 ENCOUNTER — Ambulatory Visit (HOSPITAL_COMMUNITY)
Admission: RE | Admit: 2023-06-24 | Discharge: 2023-06-24 | Disposition: A | Discharge status: Home / Self Care | Source: Ambulatory Visit | Attending: Family Medicine | Admitting: Family Medicine

## 2023-06-24 ENCOUNTER — Encounter (HOSPITAL_COMMUNITY): Payer: Self-pay

## 2023-06-24 DIAGNOSIS — Z1231 Encounter for screening mammogram for malignant neoplasm of breast: Secondary | ICD-10-CM | POA: Insufficient documentation

## 2023-07-06 ENCOUNTER — Other Ambulatory Visit: Payer: Self-pay | Admitting: Family Medicine

## 2023-07-07 ENCOUNTER — Other Ambulatory Visit: Payer: Self-pay

## 2023-07-14 IMAGING — CT CT ABD-PELV W/ CM
2 of 5 series · 16 of 46 positions shown, 18 images · IV contrast (Omnipaque or Isovue)
Comparison: CT abdomen and pelvis 03/16/2005

CLINICAL DATA: Lower abdominal pain

EXAM:
CT ABDOMEN AND PELVIS WITH CONTRAST
TECHNIQUE: Multidetector CT imaging of the abdomen and pelvis was performed
using the standard protocol following bolus administration of
intravenous contrast.

[Series 2: axial st · axial · 0.86mm/px · z∈[+782,+1222]mm · 13 of 100 slices shown, 15 images]
[im 6/100  soft-tissue]
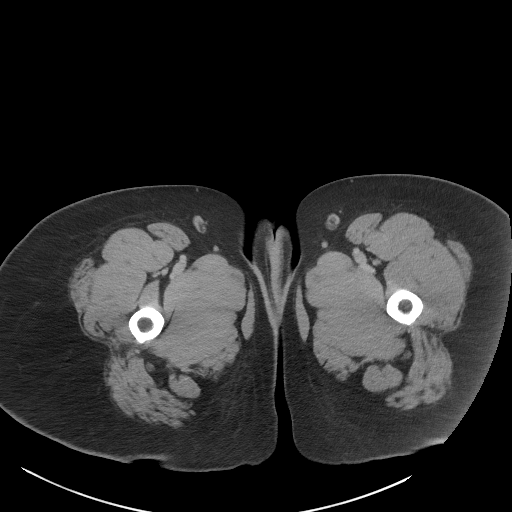
[im 6/100  bone]
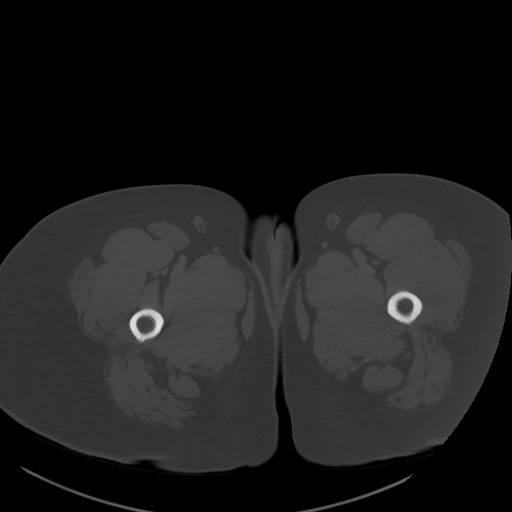
[im 12/100  soft-tissue]
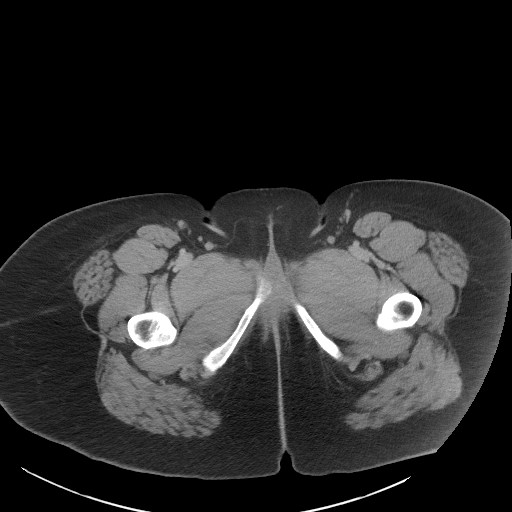
[im 24/100  soft-tissue]
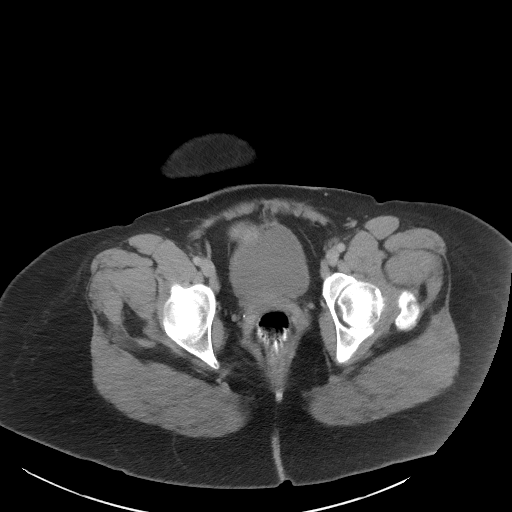
[im 30/100  soft-tissue]
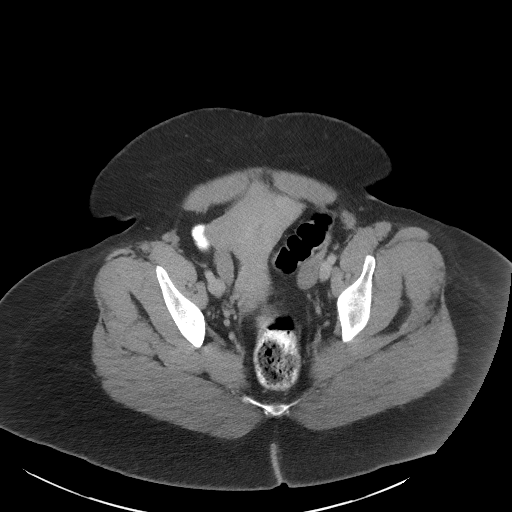
[im 35/100  soft-tissue]
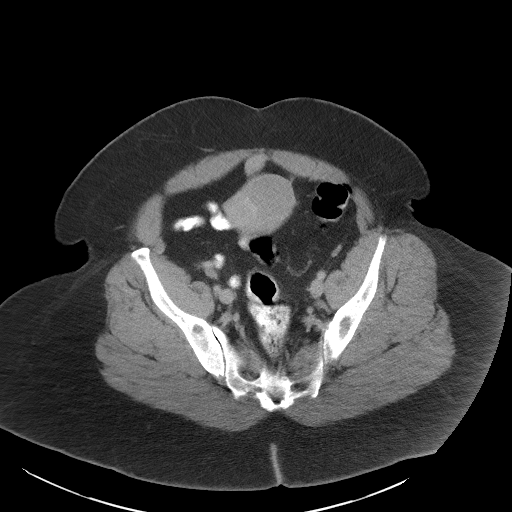
[im 41/100  soft-tissue]
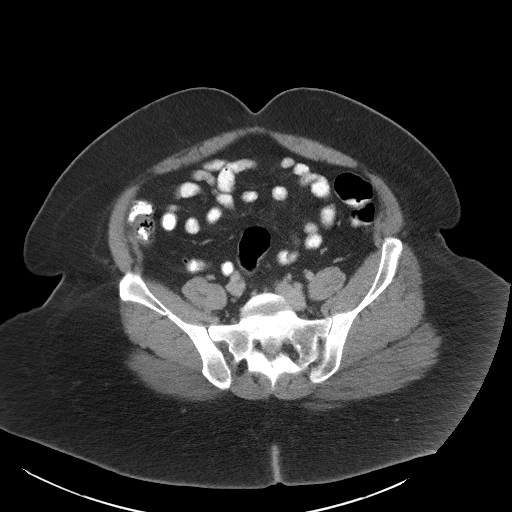
[im 53/100  soft-tissue]
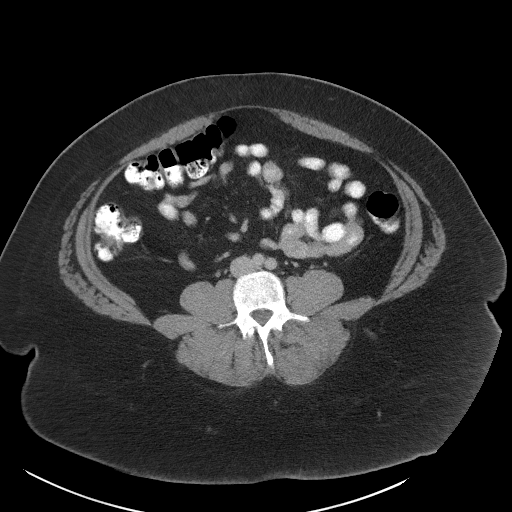
[im 59/100  soft-tissue]
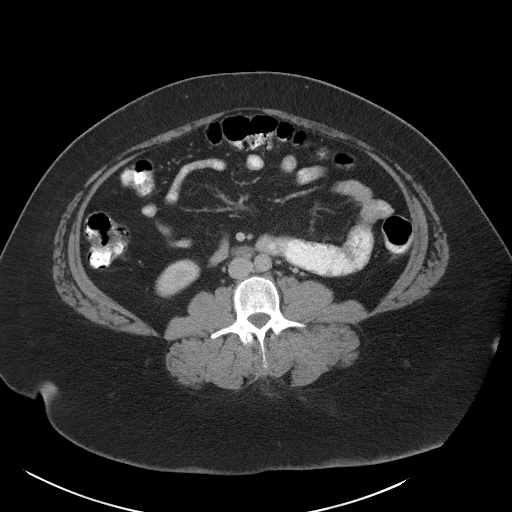
[im 65/100  soft-tissue]
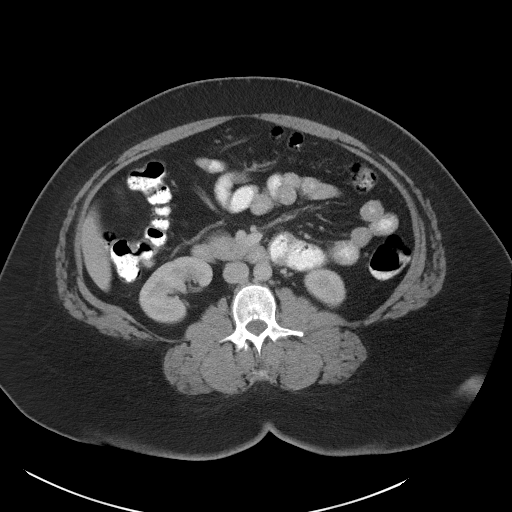
[im 65/100  bone]
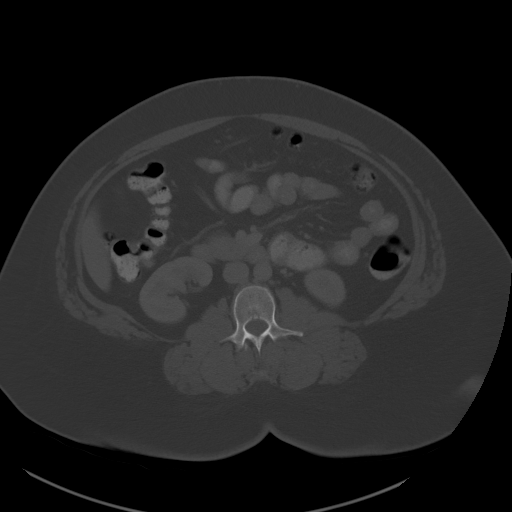
[im 70/100  soft-tissue]
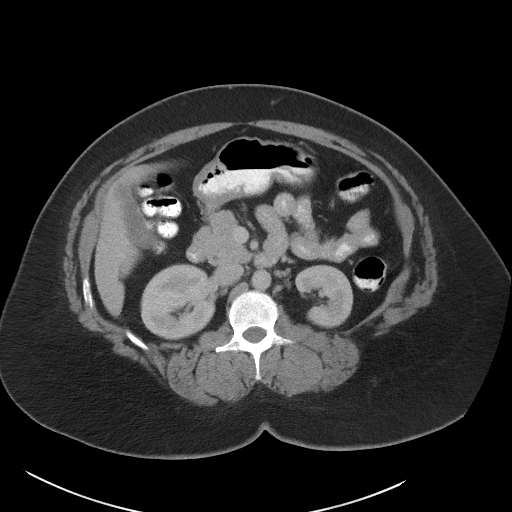
[im 76/100  soft-tissue]
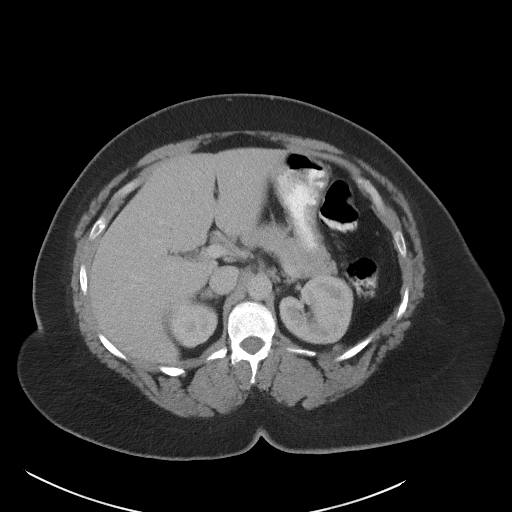
[im 88/100  soft-tissue]
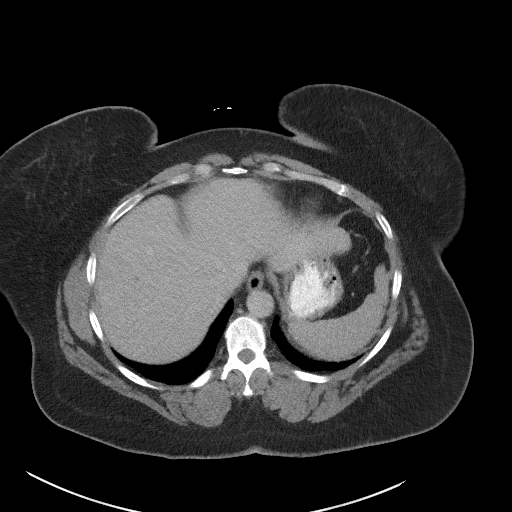
[im 94/100  soft-tissue]
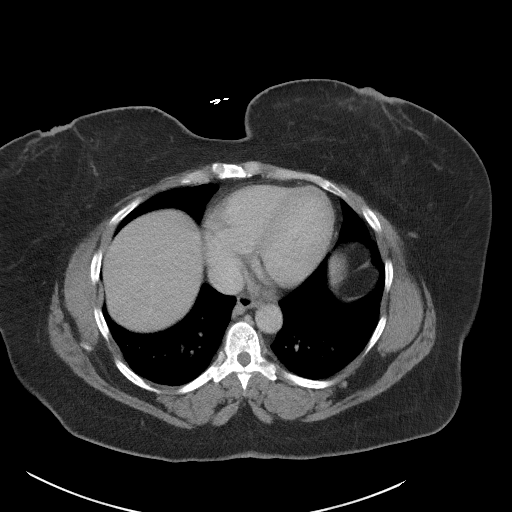

[Series 5: coronal st · coronal · 0.86mm/px · 3 of 123 slices shown]
[im 41/123  soft-tissue]
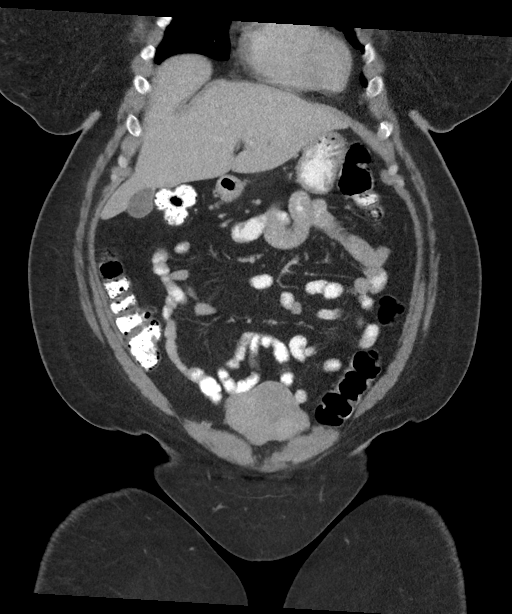
[im 55/123  soft-tissue]
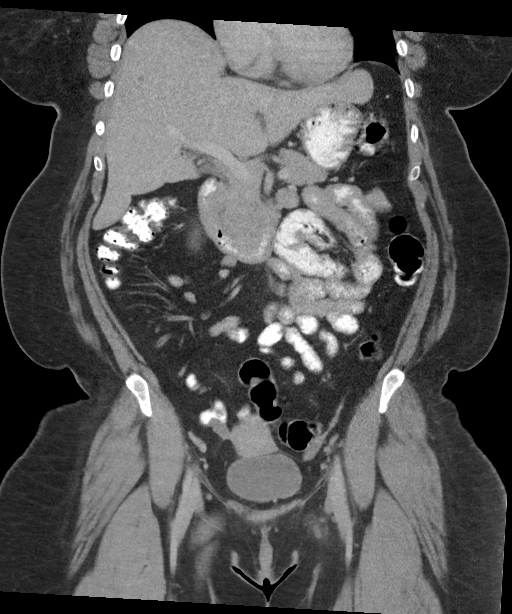
[im 68/123  soft-tissue]
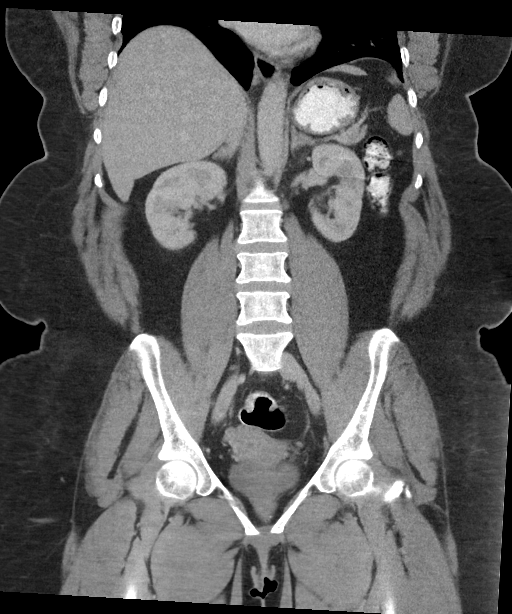

[16 of 46 positions shown; findings below may reference images not displayed]

RADIATION DOSE REDUCTION: This exam was performed according to the
departmental dose-optimization program which includes automated
exposure control, adjustment of the mA and/or kV according to
patient size and/or use of iterative reconstruction technique.

CONTRAST:  100mL OMNIPAQUE IOHEXOL 300 MG/ML  SOLN
FINDINGS: Lower chest: No acute abnormality.

Hepatobiliary: Liver is normal in size and contour with no
suspicious mass identified. Several tiny subcentimeter hypodensities
are scattered throughout the liver which appear grossly unchanged
since previous study and most likely represent small cysts and/or
hemangiomas. Gallbladder appears within normal limits. No biliary
ductal dilatation.

Pancreas: Unremarkable. No pancreatic ductal dilatation or
surrounding inflammatory changes.

Spleen: Normal in size without focal abnormality.

Adrenals/Urinary Tract: Adrenal glands are unremarkable. Kidneys are
normal, without renal calculi, focal lesion, or hydronephrosis.
Bladder is unremarkable.

Stomach/Bowel: No bowel obstruction, free air or pneumatosis. No
bowel wall edema identified. Appendix is normal.

Vascular/Lymphatic: No significant vascular findings are present. No
enlarged abdominal or pelvic lymph nodes.

Reproductive: Heterogeneous lobulated uterus consistent with
fibroids. No suspicious adnexal mass identified.

Other: No ascites.

Musculoskeletal: No acute or significant osseous findings.
IMPRESSION: 1. No acute process identified.
2. Chronic subcentimeter hypodense likely cysts or hemangiomas in
the liver.
3. Fibroid uterus.

## 2023-07-20 ENCOUNTER — Other Ambulatory Visit: Payer: Self-pay

## 2023-07-20 DIAGNOSIS — Z122 Encounter for screening for malignant neoplasm of respiratory organs: Secondary | ICD-10-CM

## 2023-07-20 DIAGNOSIS — Z87891 Personal history of nicotine dependence: Secondary | ICD-10-CM

## 2023-07-20 DIAGNOSIS — F17210 Nicotine dependence, cigarettes, uncomplicated: Secondary | ICD-10-CM

## 2023-08-04 ENCOUNTER — Ambulatory Visit: Payer: Medicare HMO

## 2023-08-04 VITALS — Ht 64.0 in | Wt 260.0 lb

## 2023-08-04 DIAGNOSIS — E1169 Type 2 diabetes mellitus with other specified complication: Secondary | ICD-10-CM

## 2023-08-04 DIAGNOSIS — Z0000 Encounter for general adult medical examination without abnormal findings: Secondary | ICD-10-CM | POA: Diagnosis not present

## 2023-08-04 NOTE — Progress Notes (Signed)
 Subjective:   Autumn [name] is a 58 y.o. who presents for a Medicare Wellness preventive visit.  As a reminder, Annual Wellness Visits don't include a physical exam, and some assessments may be limited, especially if this visit is performed virtually. We may recommend an in-person follow-up visit with your provider if needed.  Visit Complete: Virtual I connected with  Autumn [name] on 08/04/23 by a audio enabled telemedicine application and verified that I am speaking with the correct person using two identifiers.  Patient Location: Home  Provider Location: Home Office  I discussed the limitations of evaluation and management by telemedicine. The patient expressed understanding and agreed to proceed.  Vital Signs: Because this visit was a virtual/telehealth visit, some criteria may be missing or patient reported. Any vitals not documented were not able to be obtained and vitals that have been documented are patient reported.  VideoDeclined- This patient declined Librarian, academic. Therefore the visit was completed with audio only.  Persons Participating in Visit: Patient.  AWV Questionnaire: No: Patient Medicare AWV questionnaire was not completed prior to this visit.  Cardiac Risk Factors include: advanced age (>35men, >81 women);diabetes mellitus;dyslipidemia;hypertension;obesity (BMI >30kg/m2);sedentary lifestyle     Objective:    Today's Vitals   08/04/23 0944 08/04/23 0947  Weight: 260 lb (117.9 kg)   Height: 5' 4 (1.626 m)   PainSc:  7    Body mass index is 44.63 kg/m.     08/04/2023    9:35 AM 08/03/2022    9:10 AM 06/27/2021   11:26 AM 01/03/2021    3:45 AM 10/01/2020    8:48 AM 09/16/2020   10:15 AM 09/13/2020    1:57 PM  Advanced Directives  Does Patient Have a Medical Advance Directive? No No No No Yes Yes Yes  Type of Advance Directive     Living will Living will Living will  Does patient want to make changes to medical  advance directive?     No - Patient declined  No - Patient declined  Would patient like information on creating a medical advance directive? Yes (MAU/Ambulatory/Procedural Areas - Information given) No - Patient declined No - Patient declined No - Patient declined       Current Medications (verified) Outpatient Encounter Medications as of 08/04/2023  Medication Sig   ACCU-CHEK GUIDE test strip USE TO CHECK BLOOD SUGAR UP TO TWICE DAILY   Accu-Chek Softclix Lancets lancets Use as instructed   albuterol  (VENTOLIN  HFA) 108 (90 Base) MCG/ACT inhaler Inhale 2 puffs into the lungs every 6 (six) hours as needed for wheezing or shortness of breath.   Blood Glucose Monitoring Suppl (ACCU-CHEK GUIDE ME) w/Device KIT Use once daily   busPIRone  (BUSPAR ) 15 MG tablet Take 1 tablet (15 mg total) by mouth 2 (two) times daily as needed.   Cholecalciferol (VITAMIN D3) 125 MCG (5000 UT) CAPS Take 1 capsule (5,000 Units total) by mouth daily.   escitalopram  (LEXAPRO ) 10 MG tablet Take 1 tablet (10 mg total) by mouth daily.   glipiZIDE  (GLUCOTROL ) 10 MG tablet Take 1 tablet (10 mg total) by mouth daily before breakfast.   labetalol  (NORMODYNE ) 200 MG tablet Take 1 tablet (200 mg total) by mouth 2 (two) times daily.   LINZESS  290 MCG CAPS capsule TAKE 1 CAPSULE (290 MCG TOTAL) BY MOUTH DAILY BEFORE BREAKFAST.   metFORMIN  (GLUCOPHAGE ) 1000 MG tablet Take 1 tablet (1,000 mg total) by mouth 2 (two) times daily with a meal.   Omega  3 1000 MG CAPS Take 2 capsules (2,000 mg total) by mouth daily.   omega-3 acid ethyl esters (LOVAZA) 1 g capsule Take 2 capsules by mouth 2 (two) times daily.   pravastatin  (PRAVACHOL ) 80 MG tablet Take 1 tablet (80 mg total) by mouth daily.   zolpidem  (AMBIEN ) 5 MG tablet Take 1 tablet (5 mg total) by mouth at bedtime as needed for sleep.   No facility-administered encounter medications on file as of 08/04/2023.    Allergies (verified) Bee venom, Tramadol, Asa [aspirin], Latex, and  Peppermint flavoring agent (non-screening)   History: Past Medical History:  Diagnosis Date   Anxiety    Panic attacks- cries   Arthritis    Complication of anesthesia    had some hallucations   Diabetes mellitus without complication (HCC)    Fibroids, submucosal 12/02/2016   GERD (gastroesophageal reflux disease)    H/O colonoscopy    Hypertension    Rectal bleeding    in setting of hemorrhoids. Colonoscopy in July 2020   Shortness of breath dyspnea    with exertion   Thickened endometrium 12/02/2016   Past Surgical History:  Procedure Laterality Date   BALLOON DILATION N/A 10/03/2020   Procedure: BALLOON DILATION;  Surgeon: Cindie Carlin POUR, DO;  Location: [facility];  Service: Endoscopy;  Laterality: N/A;   BIOPSY  10/03/2020   Procedure: BIOPSY;  Surgeon: Cindie Carlin POUR, DO;  Location: [facility];  Service: Endoscopy;;   BREAST LUMPECTOMY Right    age 56's-benign   CESAREAN SECTION     x3   COLONOSCOPY     COLONOSCOPY WITH PROPOFOL  N/A 08/23/2018   PROPOFOL ;  Surgeon: Harvey Margo CROME, MD; three 2-4 mm polyps in the rectum and sigmoid colon, external and internal hemorrhoids, tortuous left colon.  Pathology with 3 hyperplastic polyps.  Next colonoscopy in 2030.   DENTAL SURGERY     ESOPHAGOGASTRODUODENOSCOPY (EGD) WITH PROPOFOL  N/A 10/03/2020   Surgeon: Cindie Carlin POUR, DO; Normal esophagus, Gastritis biopsied (nonspecific reactive gastropathy, negative for H. pylori), normal examined duodenum.   MASS EXCISION N/A 12/06/2017   Procedure: EXCISION 1CM CYST ON FACE;  Surgeon: Kallie Manuelita BROCKS, MD;  Location: [facility];  Service: General;  Laterality: N/A;   MULTIPLE EXTRACTIONS WITH ALVEOLOPLASTY Bilateral 04/12/2015   Procedure: BILATERAL MULTIPLE EXTRACTIONS NUMBERS TWENTY, TWENTY ONE, TWENTY TWO, TWENTY THREE, TWENTY FOUR, TWENTY FIVE, TWENTY EIGHT, TWENTY NINE WITH ALVEOLOPLASTY;  Surgeon: Glendia Primrose, DDS;  Location: [facility];  Service: Oral Surgery;   Laterality: Bilateral;   POLYPECTOMY  08/23/2018   Procedure: POLYPECTOMY;  Surgeon: Harvey Margo CROME, MD;  Location: [facility];  Service: Endoscopy;;   TUBAL LIGATION     Family History  Adopted: Yes  Problem Relation Age of Onset   Diabetes Daughter    Heart disease Other    Colon cancer Neg Hx    Social History   Socioeconomic History   Marital status: Significant Other    Spouse name: Not on file   Number of children: Not on file   Years of education: Not on file   Highest education level: Not on file  Occupational History   Not on file  Tobacco Use   Smoking status: Every Day    Current packs/day: 1.00    Average packs/day: 1 pack/day for 31.0 years (31.0 ttl pk-yrs)    Types: Cigarettes   Smokeless tobacco: Never  Vaping Use   Vaping status: Never Used  Substance and Sexual Activity  Alcohol use: Yes    Comment: occ   Drug use: No   Sexual activity: Yes    Birth control/protection: Surgical    Comment: tubal  Other Topics Concern   Not on file  Social History Narrative   Not on file   Social Drivers of Health   Financial Resource Strain: Low Risk  (08/04/2023)   Overall Financial Resource Strain (CARDIA)    Difficulty of Paying Living Expenses: Not hard at all  Food Insecurity: No Food Insecurity (08/04/2023)   Hunger Vital Sign    Worried About Running Out of Food in the Last Year: Never true    Ran Out of Food in the Last Year: Never true  Transportation Needs: No Transportation Needs (08/04/2023)   PRAPARE - Administrator, Civil Service (Medical): No    Lack of Transportation (Non-Medical): No  Physical Activity: Sufficiently Active (08/04/2023)   Exercise Vital Sign    Days of Exercise per Week: 7 days    Minutes of Exercise per Session: 30 min  Stress: No Stress Concern Present (08/04/2023)   [employer] of Occupational Health - Occupational Stress Questionnaire    Feeling of Stress: Not at all  Social Connections: Moderately  Isolated (08/04/2023)   Social Connection and Isolation Panel    Frequency of Communication with Friends and Family: More than three times a week    Frequency of Social Gatherings with Friends and Family: More than three times a week    Attends Religious Services: More than 4 times per year    Active Member of [employer] or Organizations: No    Attends Engineer, structural: Never    Marital Status: Never married    Tobacco Counseling Ready to quit: Yes Counseling given: Yes  Clinical Intake:  Pre-visit preparation completed: Yes  Pain : 0-10 Pain Score: 7  Pain Type: Chronic pain Pain Location: Shoulder Pain Orientation: Left, Right Pain Descriptors / Indicators: Aching, Constant Pain Onset: More than a month ago Pain Frequency: Constant     BMI - recorded: 44.63 Nutritional Status: BMI > 30  Obese Nutritional Risks: None Diabetes: Yes CBG done?: No (telehealth visit.) Did pt. bring in CBG monitor from home?: No  Lab Results  Component Value Date   HGBA1C 7.5 (H) 05/26/2023   HGBA1C 6.9 (H) 01/25/2023   HGBA1C 7.2 (H) 10/20/2022     How often do you need to have someone help you when you read instructions, pamphlets, or other written materials from your doctor or pharmacy?: 1 - Never  Interpreter Needed?: No  Information entered by :: A Taneeka Curtner, CMA-AAMA  Activities of Daily Living     08/04/2023   10:00 AM  In your present state of health, do you have any difficulty performing the following activities:  Hearing? 0  Vision? 0  Difficulty concentrating or making decisions? 0  Walking or climbing stairs? 0  Dressing or bathing? 0  Doing errands, shopping? 0  Preparing Food and eating ? N  Using the Toilet? N  In the past six months, have you accidently leaked urine? N  Do you have problems with loss of bowel control? N  Managing your Medications? N  Managing your Finances? N  Housekeeping or managing your Housekeeping? N    Patient Care Team: Del  Wilhelmena Falter, Hilario, FNP as PCP - General (Family Medicine) Cindie, Carlin POUR, DO as Consulting Physician (Internal Medicine) Marilynn Nest, DO as Consulting Physician (Obstetrics and Gynecology) Valri Lamarr LABOR, NP  as Nurse Practitioner (Pulmonary Disease) Rudy Josette RAMAN PA-C as Physician Assistant (Gastroenterology) Onesimo Oneil LABOR, MD as Consulting Physician (Orthopedic Surgery)  I have updated your Care Teams any recent Medical Services you may have received from other providers in the past year.     Assessment:   This is a routine wellness examination for Autumn [name].  Hearing/Vision screen Hearing Screening - Comments:: Patient denies any hearing difficulties.   Vision Screening - Comments:: Patient is not up to date with eye exams. Will schedule a diabetic retinal screening. Patient in agreement with treatment plan.     Goals Addressed             This Visit's Progress    Patient Stated   On track    To remain as healthy and active as she currently is.        Depression Screen     08/04/2023   10:01 AM 06/02/2023   11:30 AM 05/26/2023   10:06 AM 01/25/2023    8:57 AM 10/20/2022    8:15 AM 10/20/2022    8:09 AM 10/16/2022    9:04 AM  PHQ 2/9 Scores  PHQ - 2 Score 0 1 2 3  0 0 3  PHQ- 9 Score 0 14 6 9 5  0 11    Fall Risk     08/04/2023    9:54 AM 05/26/2023   10:06 AM 01/25/2023    8:56 AM 10/20/2022    8:15 AM 10/20/2022    8:09 AM  Fall Risk   Falls in the past year? 0 0 0 0 0  Number falls in past yr: 0 0 0 0 0  Injury with Fall? 0 0 0 0 0  Risk for fall due to : No Fall Risks No Fall Risks No Fall Risks No Fall Risks No Fall Risks  Follow up Falls evaluation completed;Education provided;Falls prevention discussed  Falls evaluation completed Falls evaluation completed Falls evaluation completed    MEDICARE RISK AT HOME:  Medicare Risk at Home Any stairs in or around the home?: No If so, are there any without handrails?: No Home free of loose throw  rugs in walkways, pet beds, electrical cords, etc?: Yes Adequate lighting in your home to reduce risk of falls?: Yes Life alert?: No Use of a cane, walker or w/c?: No Grab bars in the bathroom?: No Shower chair or bench in shower?: Yes Elevated toilet seat or a handicapped toilet?: No  TIMED UP AND GO:  Was the test performed? No  Cognitive Function: 6CIT completed        08/04/2023   10:00 AM 08/03/2022    9:18 AM  6CIT Screen  What Year? 0 points 0 points  What month? 0 points 0 points  What time? 0 points 0 points  Count back from 20 0 points 0 points  Months in reverse 0 points 0 points  Repeat phrase 0 points 0 points  Total Score 0 points 0 points    Immunizations Immunization History  Administered Date(s) Administered   Influenza,inj,Quad PF,6+ Mos 09/30/2018    Screening Tests Health Maintenance  Topic Date Due   COVID-19 Vaccine (1) Never done   OPHTHALMOLOGY EXAM  Never done   Pneumococcal Vaccine 19-62 Years old (1 of 2 - PCV) Never done   Hepatitis B Vaccines (1 of 3 - 19+ 3-dose series) Never done   Zoster Vaccines- Shingrix (1 of 2) Never done   Diabetic kidney evaluation - Urine ACR  06/12/2023  DTaP/Tdap/Td (1 - Tdap) 01/25/2024 (Originally [birth year]19/1986)   INFLUENZA VACCINE  08/27/2023   HEMOGLOBIN A1C  11/25/2023   FOOT EXAM  01/25/2024   Cervical Cancer Screening (HPV/Pap Cotest)  03/13/2024   Diabetic kidney evaluation - eGFR measurement  05/25/2024   Lung Cancer Screening  06/17/2024   MAMMOGRAM  06/23/2024   Medicare Annual Wellness (AWV)  08/03/2024   Colonoscopy  08/22/2028   Hepatitis C Screening  Completed   HIV Screening  Completed   HPV VACCINES  Aged Out   Meningococcal B Vaccine  Aged Out    Health Maintenance Patient up to date on all routine health screenings. Aware of recommended vaccines and where those can be given.   Additional Screening:  Vision Screening: Recommended annual ophthalmology exams for early detection of  glaucoma and other disorders of the eye. Would you like a referral to an eye doctor? No, patient is up to date with yearly exams.   Dental Screening: Recommended annual dental exams for proper oral hygiene  Community Resource Referral / Chronic Care Management: CRR required this visit? NO  CCM required this visit? NO Plan:   I have personally reviewed and noted the following in the patient's chart:   Medical and social history Use of alcohol, tobacco or illicit drugs  Current medications and supplements including opioid prescriptions. Patient is not currently taking opioid prescriptions. Functional ability and status Nutritional status Physical activity Advanced directives List of other physicians Hospitalizations, surgeries, and ER visits in previous 12 months Vitals Screenings to include cognitive, depression, and falls Referrals and appointments  In addition, I have reviewed and discussed with patient certain preventive protocols, quality metrics, and best practice recommendations. A written personalized care plan for preventive services as well as general preventive health recommendations were provided to patient.   Madilynne Mullan, CMA   08/04/2023   After Visit Summary: (MyChart) Due to this being a telephonic visit, the after visit summary with patients personalized plan was offered to patient via MyChart   Notes: Nothing significant to report at this time.

## 2023-08-04 NOTE — Patient Instructions (Signed)
 Ms. Autumn [name] ,  Thank you for taking time out of your busy schedule to complete your Annual Wellness Visit with me. I enjoyed our conversation and look forward to speaking with you again next year. I, as well as your care team,  appreciate your ongoing commitment to your health goals. Please review the following plan we discussed and let me know if I can assist you in the future.  I enjoyed our conversation and look forward to it again next year. Blessing for the upcoming year!!  -Hayven Fatima  Your Game plan/ To Do List   Referrals Placed:  Lab Orders         Microalbumin / creatinine urine ratio    Have these labs completed at [facility].   Follow up Visits: Next Office Visit with your Provider: September 29, 2023 at 10:00am  1 year follow up Medicare Annual Wellness Visit: August 07, 2024 at 12:30pm video visit.     Clinician Recommendations:  Aim for 30 minutes of exercise or brisk walking, 6-8 glasses of water , and 5 servings of fruits and vegetables each day.       This is a list of the screening recommended for you and due dates:  Health Maintenance  Topic Date Due   COVID-19 Vaccine (1) Never done   Eye exam for diabetics  Never done   Pneumococcal Vaccination (1 of 2 - PCV) Never done   Hepatitis B Vaccine (1 of 3 - 19+ 3-dose series) Never done   Zoster (Shingles) Vaccine (1 of 2) Never done   Yearly kidney health urinalysis for diabetes  06/12/2023   DTaP/Tdap/Td vaccine (1 - Tdap) 01/25/2024*   Flu Shot  08/27/2023   Hemoglobin A1C  11/25/2023   Complete foot exam   01/25/2024   Pap with HPV screening  03/13/2024   Yearly kidney function blood test for diabetes  05/25/2024   Screening for Lung Cancer  06/17/2024   Mammogram  06/23/2024   Medicare Annual Wellness Visit  08/03/2024   Colon Cancer Screening  08/22/2028   Hepatitis C Screening  Completed   HIV Screening  Completed   HPV Vaccine  Aged Out   Meningitis B Vaccine  Aged Out  *Topic was postponed.  The date shown is not the original due date.    Advanced directives: (Declined) Advance directive discussed with you today. Even though you declined this today, please call our office should you change your mind, and we can give you the proper paperwork for you to fill out. Advance Care Planning is important because it:  [x]  Makes sure you receive the medical care that is consistent with your values, goals, and preferences  [x]  It provides guidance to your family and loved ones and reduces their decisional burden about whether or not they are making the right decisions based on your wishes.  Follow the link provided in your after visit summary or read over the paperwork we have mailed to you to help you started getting your Advance Directives in place. If you need assistance in completing these, please reach out to us  so that we can help you!  If you choose to send your directives in yourself, the information to do so is below:  Please email a copy of your Advanced Healthcare Directives,such as your Healthcare Power of Attorney, Living Will or DNR status to the following secure email: [email or web address]

## 2023-08-05 ENCOUNTER — Ambulatory Visit

## 2023-08-05 DIAGNOSIS — E119 Type 2 diabetes mellitus without complications: Secondary | ICD-10-CM

## 2023-08-05 LAB — HM DIABETES EYE EXAM

## 2023-08-05 NOTE — Progress Notes (Signed)
 Arrived on 08/05/2023 and has given verbal consent to obtain images and complete their overdue diabetic retinal screening.  The images have been sent to an ophthalmologist or optometrist for review and interpretation.  Results will be sent back to [facility] for review.  Patient has been informed they will be contacted when we receive the results via telephone or MyChart.

## 2023-08-10 NOTE — Progress Notes (Unsigned)
 Referring Provider: Del Orbe Polanco, Ilian* Primary Care Physician:  Terry Wilhelmena Lloyd Hilario, FNP Primary GI Physician: Dr. Cindie  No chief complaint on file.   HPI:   Autumn [name] is a 58 y.o. female with history of constipation, GERD, dysphagia, rectal bleeding due to internal hemorrhoids.  Colonoscopy up-to-date July 2020 with 3 hyperplastic polyps, external/internal hemorrhoids, tortuous left colon, due for repeat in 2030.  She is presenting today for follow-up of constipation and GERD.  Last seen in the office 01/08/2023.  Chronic constipation not adequately managed with MiraLAX  290 mcg daily.  Recommended adding MiraLAX  1-2 times daily.  Also recently with episode of scant toilet tissue hematochezia and was found to have a small, moderately firm external hemorrhoid on exam.  Recommended supportive measures, Anusol  rectal cream, and follow-up in 3 months.  Today:    Past Medical History:  Diagnosis Date   Anxiety    Panic attacks- cries   Arthritis    Complication of anesthesia    had some hallucations   Diabetes mellitus without complication (HCC)    Fibroids, submucosal 12/02/2016   GERD (gastroesophageal reflux disease)    H/O colonoscopy    Hypertension    Rectal bleeding    in setting of hemorrhoids. Colonoscopy in July 2020   Shortness of breath dyspnea    with exertion   Thickened endometrium 12/02/2016    Past Surgical History:  Procedure Laterality Date   BALLOON DILATION N/A 10/03/2020   Procedure: BALLOON DILATION;  Surgeon: Cindie Carlin POUR, DO;  Location: [facility];  Service: Endoscopy;  Laterality: N/A;   BIOPSY  10/03/2020   Procedure: BIOPSY;  Surgeon: Cindie Carlin POUR, DO;  Location: [facility];  Service: Endoscopy;;   BREAST LUMPECTOMY Right    age 49's-benign   CESAREAN SECTION     x3   COLONOSCOPY     COLONOSCOPY WITH PROPOFOL  N/A 08/23/2018   PROPOFOL ;  Surgeon: Harvey Margo LITTIE, MD; three 2-4 mm polyps in the rectum  and sigmoid colon, external and internal hemorrhoids, tortuous left colon.  Pathology with 3 hyperplastic polyps.  Next colonoscopy in 2030.   DENTAL SURGERY     ESOPHAGOGASTRODUODENOSCOPY (EGD) WITH PROPOFOL  N/A 10/03/2020   Surgeon: Cindie Carlin POUR, DO; Normal esophagus, Gastritis biopsied (nonspecific reactive gastropathy, negative for H. pylori), normal examined duodenum.   MASS EXCISION N/A 12/06/2017   Procedure: EXCISION 1CM CYST ON FACE;  Surgeon: Kallie Manuelita BROCKS, MD;  Location: [facility];  Service: General;  Laterality: N/A;   MULTIPLE EXTRACTIONS WITH ALVEOLOPLASTY Bilateral 04/12/2015   Procedure: BILATERAL MULTIPLE EXTRACTIONS NUMBERS TWENTY, TWENTY ONE, TWENTY TWO, TWENTY THREE, TWENTY FOUR, TWENTY FIVE, TWENTY EIGHT, TWENTY NINE WITH ALVEOLOPLASTY;  Surgeon: Glendia Primrose, DDS;  Location: [facility];  Service: Oral Surgery;  Laterality: Bilateral;   POLYPECTOMY  08/23/2018   Procedure: POLYPECTOMY;  Surgeon: Harvey Margo LITTIE, MD;  Location: [facility];  Service: Endoscopy;;   TUBAL LIGATION      Current Outpatient Medications  Medication Sig Dispense Refill   ACCU-CHEK GUIDE test strip USE TO CHECK BLOOD SUGAR UP TO TWICE DAILY     Accu-Chek Softclix Lancets lancets Use as instructed 100 each 0   albuterol  (VENTOLIN  HFA) 108 (90 Base) MCG/ACT inhaler Inhale 2 puffs into the lungs every 6 (six) hours as needed for wheezing or shortness of breath. 8 g 2   Blood Glucose Monitoring Suppl (ACCU-CHEK GUIDE ME) w/Device KIT Use once daily 1 kit 1  busPIRone  (BUSPAR ) 15 MG tablet Take 1 tablet (15 mg total) by mouth 2 (two) times daily as needed. 60 tablet 2   Cholecalciferol (VITAMIN D3) 125 MCG (5000 UT) CAPS Take 1 capsule (5,000 Units total) by mouth daily. 90 capsule 1   escitalopram  (LEXAPRO ) 10 MG tablet Take 1 tablet (10 mg total) by mouth daily. 30 tablet 3   glipiZIDE  (GLUCOTROL ) 10 MG tablet Take 1 tablet (10 mg total) by mouth daily before breakfast. 100 tablet 3   labetalol   (NORMODYNE ) 200 MG tablet Take 1 tablet (200 mg total) by mouth 2 (two) times daily. 90 tablet 3   LINZESS  290 MCG CAPS capsule TAKE 1 CAPSULE (290 MCG TOTAL) BY MOUTH DAILY BEFORE BREAKFAST. 90 capsule 3   metFORMIN  (GLUCOPHAGE ) 1000 MG tablet Take 1 tablet (1,000 mg total) by mouth 2 (two) times daily with a meal. 180 tablet 3   Omega 3 1000 MG CAPS Take 2 capsules (2,000 mg total) by mouth daily. 180 capsule 2   omega-3 acid ethyl esters (LOVAZA) 1 g capsule Take 2 capsules by mouth 2 (two) times daily.     pravastatin  (PRAVACHOL ) 80 MG tablet Take 1 tablet (80 mg total) by mouth daily. 90 tablet 3   zolpidem  (AMBIEN ) 5 MG tablet Take 1 tablet (5 mg total) by mouth at bedtime as needed for sleep. 30 tablet 0   No current facility-administered medications for this visit.    Allergies as of 08/12/2023 - Review Complete 08/04/2023  Allergen Reaction Noted   Bee venom Anaphylaxis 11/04/2010   Tramadol Hives 11/04/2010   Asa [aspirin] Other (See Comments) 08/14/2015   Latex Itching 12/26/2018   Peppermint flavoring agent (non-screening) Other (See Comments) 03/01/2018    Family History  Adopted: Yes  Problem Relation Age of Onset   Diabetes Daughter    Heart disease Other    Colon cancer Neg Hx     Social History   Socioeconomic History   Marital status: Significant Other    Spouse name: Not on file   Number of children: Not on file   Years of education: Not on file   Highest education level: Not on file  Occupational History   Not on file  Tobacco Use   Smoking status: Every Day    Current packs/day: 1.00    Average packs/day: 1 pack/day for 31.0 years (31.0 ttl pk-yrs)    Types: Cigarettes   Smokeless tobacco: Never  Vaping Use   Vaping status: Never Used  Substance and Sexual Activity   Alcohol use: Yes    Comment: occ   Drug use: No   Sexual activity: Yes    Birth control/protection: Surgical    Comment: tubal  Other Topics Concern   Not on file  Social  History Narrative   Not on file   Social Drivers of Health   Financial Resource Strain: Low Risk  (08/04/2023)   Overall Financial Resource Strain (CARDIA)    Difficulty of Paying Living Expenses: Not hard at all  Food Insecurity: No Food Insecurity (08/04/2023)   Hunger Vital Sign    Worried About Running Out of Food in the Last Year: Never true    Ran Out of Food in the Last Year: Never true  Transportation Needs: No Transportation Needs (08/04/2023)   PRAPARE - Administrator, Civil Service (Medical): No    Lack of Transportation (Non-Medical): No  Physical Activity: Sufficiently Active (08/04/2023)   Exercise Vital Sign    Days  of Exercise per Week: 7 days    Minutes of Exercise per Session: 30 min  Stress: No Stress Concern Present (08/04/2023)   [employer] of Occupational Health - Occupational Stress Questionnaire    Feeling of Stress: Not at all  Social Connections: Moderately Isolated (08/04/2023)   Social Connection and Isolation Panel    Frequency of Communication with Friends and Family: More than three times a week    Frequency of Social Gatherings with Friends and Family: More than three times a week    Attends Religious Services: More than 4 times per year    Active Member of [employer] or Organizations: No    Attends Banker Meetings: Never    Marital Status: Never married    Review of Systems: Gen: Denies fever, chills, anorexia. Denies fatigue, weakness, weight loss.  CV: Denies chest pain, palpitations, syncope, peripheral edema, and claudication. Resp: Denies dyspnea at rest, cough, wheezing, coughing up blood, and pleurisy. GI: Denies vomiting blood, jaundice, and fecal incontinence.   Denies dysphagia or odynophagia. Derm: Denies rash, itching, dry skin Psych: Denies depression, anxiety, memory loss, confusion. No homicidal or suicidal ideation.  Heme: Denies bruising, bleeding, and enlarged lymph nodes.  Physical Exam: LMP 11/11/2016   General:   Alert and oriented. No distress noted. Pleasant and cooperative.  Head:  Normocephalic and atraumatic. Eyes:  Conjuctiva clear without scleral icterus. Heart:  S1, S2 present without murmurs appreciated. Lungs:  Clear to auscultation bilaterally. No wheezes, rales, or rhonchi. No distress.  Abdomen:  +BS, soft, non-tender and non-distended. No rebound or guarding. No HSM or masses noted. Msk:  Symmetrical without gross deformities. Normal posture. Extremities:  Without edema. Neurologic:  Alert and  oriented x4 Psych:  Normal mood and affect.    Assessment:     Plan:  ***   Josette Centers, PA-C [facility] 08/12/2023

## 2023-08-12 ENCOUNTER — Encounter: Payer: Self-pay | Admitting: Gastroenterology

## 2023-08-12 ENCOUNTER — Ambulatory Visit (INDEPENDENT_AMBULATORY_CARE_PROVIDER_SITE_OTHER): Admitting: Gastroenterology

## 2023-08-12 VITALS — BP 136/81 | HR 101 | Temp 97.8°F | Ht 64.0 in | Wt 252.4 lb

## 2023-08-12 DIAGNOSIS — K648 Other hemorrhoids: Secondary | ICD-10-CM

## 2023-08-12 DIAGNOSIS — Z860102 Personal history of hyperplastic colon polyps: Secondary | ICD-10-CM

## 2023-08-12 DIAGNOSIS — K649 Unspecified hemorrhoids: Secondary | ICD-10-CM

## 2023-08-12 DIAGNOSIS — K5909 Other constipation: Secondary | ICD-10-CM | POA: Diagnosis not present

## 2023-08-12 DIAGNOSIS — K219 Gastro-esophageal reflux disease without esophagitis: Secondary | ICD-10-CM | POA: Diagnosis not present

## 2023-08-12 DIAGNOSIS — K625 Hemorrhage of anus and rectum: Secondary | ICD-10-CM

## 2023-08-12 DIAGNOSIS — Z8719 Personal history of other diseases of the digestive system: Secondary | ICD-10-CM | POA: Diagnosis not present

## 2023-08-12 DIAGNOSIS — K5900 Constipation, unspecified: Secondary | ICD-10-CM

## 2023-08-12 NOTE — Patient Instructions (Signed)
 Continue omeprazole  20 mg daily.  I suspect her recent rectal bleeding was related to known hemorrhoids.  I am glad that your symptoms have resolved.  If they return, you can use your Anusol  rectal cream twice a day for total of 7 days.   Continue to use Linzess  290 mcg as needed if you have any constipation problems.  Be sure to avoid straining and limit your toilet time to 2-3 minutes to prevent hemorrhoid flares.  I will plan to see back in 6 months or sooner if needed.  Josette Centers, PA-C [facility]

## 2023-08-26 ENCOUNTER — Telehealth: Payer: Self-pay | Admitting: *Deleted

## 2023-08-26 ENCOUNTER — Telehealth: Payer: Self-pay | Admitting: Gastroenterology

## 2023-08-26 NOTE — Telephone Encounter (Signed)
 Tried to return pt's phone call several time to see what was wrong. Left a message for her to call office back.

## 2023-08-26 NOTE — Telephone Encounter (Signed)
 Patient called office left voicemail. Patient stated she needed to be seen today 08/26/23 tried to contact patient via phone to scheduled patient appointment we have open for today left voicemail for patient to call back.

## 2023-08-30 ENCOUNTER — Other Ambulatory Visit: Payer: Self-pay

## 2023-08-30 ENCOUNTER — Telehealth: Payer: Self-pay

## 2023-08-30 DIAGNOSIS — K625 Hemorrhage of anus and rectum: Secondary | ICD-10-CM | POA: Diagnosis not present

## 2023-08-30 NOTE — Telephone Encounter (Signed)
 Pt was made aware and verbalized understanding. Lab was ordered and pt was instructed to have completed asap. Appt to discuss banding was made.

## 2023-08-30 NOTE — Telephone Encounter (Signed)
 Pt called stating that she has been having issues with rectal bleeding. Pt stated that this past Sunday it was real bad. Pt denies having to strain to have a bm. Pt is wanting to know what you suggest.

## 2023-08-30 NOTE — Telephone Encounter (Signed)
 Recommend using Anusol  Rectal cream 2-3 times daily for known hemorrhoids.   We can update a CBC to ensure no significant drop in her hemoglobin. Please arrange.   If persistent rectal bleeding, specifically large volume rectal bleeding or if she begins to feel lightheaded, weak, recommend presenting to the ER.   Otherwise, would recommend arranging follow-up with Therisa or Charmaine to discuss possible hemorrhoid banding as this has continued to be an intermittent issue for her.

## 2023-08-31 ENCOUNTER — Ambulatory Visit: Payer: Self-pay | Admitting: Gastroenterology

## 2023-08-31 LAB — CBC WITH DIFFERENTIAL/PLATELET
Basophils Absolute: 0.1 x10E3/uL (ref 0.0–0.2)
Basos: 1 %
EOS (ABSOLUTE): 0.2 x10E3/uL (ref 0.0–0.4)
Eos: 3 %
Hematocrit: 36.3 % (ref 34.0–46.6)
Hemoglobin: 10.9 g/dL — ABNORMAL LOW (ref 11.1–15.9)
Immature Grans (Abs): 0 x10E3/uL (ref 0.0–0.1)
Immature Granulocytes: 0 %
Lymphocytes Absolute: 3.2 x10E3/uL — ABNORMAL HIGH (ref 0.7–3.1)
Lymphs: 38 %
MCH: 22.4 pg — ABNORMAL LOW (ref 26.6–33.0)
MCHC: 30 g/dL — ABNORMAL LOW (ref 31.5–35.7)
MCV: 75 fL — ABNORMAL LOW (ref 79–97)
Monocytes Absolute: 0.8 x10E3/uL (ref 0.1–0.9)
Monocytes: 10 %
Neutrophils Absolute: 4.2 x10E3/uL (ref 1.4–7.0)
Neutrophils: 48 %
Platelets: 469 x10E3/uL — ABNORMAL HIGH (ref 150–450)
RBC: 4.86 x10E6/uL (ref 3.77–5.28)
RDW: 15.2 % (ref 11.7–15.4)
WBC: 8.6 x10E3/uL (ref 3.4–10.8)

## 2023-09-01 ENCOUNTER — Emergency Department (HOSPITAL_COMMUNITY)
Admission: EM | Admit: 2023-09-01 | Discharge: 2023-09-01 | Disposition: A | Discharge status: Home / Self Care | Source: Ambulatory Visit | Attending: Emergency Medicine | Admitting: Emergency Medicine

## 2023-09-01 ENCOUNTER — Encounter (HOSPITAL_COMMUNITY): Payer: Self-pay | Admitting: *Deleted

## 2023-09-01 ENCOUNTER — Emergency Department (HOSPITAL_COMMUNITY)

## 2023-09-01 ENCOUNTER — Other Ambulatory Visit: Payer: Self-pay

## 2023-09-01 DIAGNOSIS — K625 Hemorrhage of anus and rectum: Secondary | ICD-10-CM | POA: Diagnosis not present

## 2023-09-01 DIAGNOSIS — D500 Iron deficiency anemia secondary to blood loss (chronic): Secondary | ICD-10-CM | POA: Diagnosis not present

## 2023-09-01 DIAGNOSIS — K649 Unspecified hemorrhoids: Secondary | ICD-10-CM

## 2023-09-01 DIAGNOSIS — D509 Iron deficiency anemia, unspecified: Secondary | ICD-10-CM | POA: Diagnosis not present

## 2023-09-01 DIAGNOSIS — Z91040 Latex allergy status: Secondary | ICD-10-CM | POA: Diagnosis not present

## 2023-09-01 DIAGNOSIS — R1032 Left lower quadrant pain: Secondary | ICD-10-CM | POA: Diagnosis not present

## 2023-09-01 DIAGNOSIS — R932 Abnormal findings on diagnostic imaging of liver and biliary tract: Secondary | ICD-10-CM | POA: Diagnosis not present

## 2023-09-01 DIAGNOSIS — K5730 Diverticulosis of large intestine without perforation or abscess without bleeding: Secondary | ICD-10-CM | POA: Diagnosis not present

## 2023-09-01 DIAGNOSIS — K644 Residual hemorrhoidal skin tags: Secondary | ICD-10-CM | POA: Diagnosis not present

## 2023-09-01 LAB — COMPREHENSIVE METABOLIC PANEL WITH GFR
ALT: 20 U/L (ref 0–44)
AST: 25 U/L (ref 15–41)
Albumin: 3.5 g/dL (ref 3.5–5.0)
Alkaline Phosphatase: 74 U/L (ref 38–126)
Anion gap: 11 (ref 5–15)
BUN: 11 mg/dL (ref 6–20)
CO2: 22 mmol/L (ref 22–32)
Calcium: 9.2 mg/dL (ref 8.9–10.3)
Chloride: 104 mmol/L (ref 98–111)
Creatinine, Ser: 0.82 mg/dL (ref 0.44–1.00)
GFR, Estimated: >60 mL/min (ref >60)
Glucose, Bld: 113 mg/dL — ABNORMAL HIGH (ref 70–99)
Potassium: 4.2 mmol/L (ref 3.5–5.1)
Sodium: 137 mmol/L (ref 135–145)
Total Bilirubin: 0.7 mg/dL (ref 0.0–1.2)
Total Protein: 7 g/dL (ref 6.5–8.1)

## 2023-09-01 LAB — CBC WITH DIFFERENTIAL/PLATELET
Abs Immature Granulocytes: 0.03 K/uL (ref 0.00–0.07)
Basophils Absolute: 0.1 K/uL (ref 0.0–0.1)
Basophils Relative: 1 %
Eosinophils Absolute: 0.2 K/uL (ref 0.0–0.5)
Eosinophils Relative: 3 %
HCT: 32.3 % — ABNORMAL LOW (ref 36.0–46.0)
Hemoglobin: 10 g/dL — ABNORMAL LOW (ref 12.0–15.0)
Immature Granulocytes: 0 %
Lymphocytes Relative: 36 %
Lymphs Abs: 3.2 K/uL (ref 0.7–4.0)
MCH: 22.3 pg — ABNORMAL LOW (ref 26.0–34.0)
MCHC: 31 g/dL (ref 30.0–36.0)
MCV: 71.9 fL — ABNORMAL LOW (ref 80.0–100.0)
Monocytes Absolute: 0.8 K/uL (ref 0.1–1.0)
Monocytes Relative: 9 %
Neutro Abs: 4.4 K/uL (ref 1.7–7.7)
Neutrophils Relative %: 51 %
Platelets: 459 K/uL — ABNORMAL HIGH (ref 150–400)
RBC: 4.49 MIL/uL (ref 3.87–5.11)
RDW: 15.5 % (ref 11.5–15.5)
WBC: 8.7 K/uL (ref 4.0–10.5)
nRBC: 0 % (ref 0.0–0.2)

## 2023-09-01 LAB — CBG MONITORING, ED: Glucose-Capillary: 110 mg/dL — ABNORMAL HIGH (ref 70–99)

## 2023-09-01 MED ORDER — IOHEXOL 300 MG/ML  SOLN
100.0000 mL | Freq: Once | INTRAMUSCULAR | Status: AC | PRN
  Administered 2023-09-01: 100 mL via INTRAVENOUS

## 2023-09-01 NOTE — Discharge Instructions (Addendum)
 Follow-up closely with Josette Centers as discussed, you probably will need a repeat colonoscopy in the near future, along with getting your hemorrhoids definitively treated so they stop bleeding.  In the interim return here if you develop worsening weakness or heavier bleeding.

## 2023-09-01 NOTE — ED Provider Notes (Signed)
 [place] EMERGENCY DEPARTMENT AT [facility] Provider Note   CSN: [id number] Arrival date & time: 09/01/23  1428     Patient presents with: GI Bleeding   Autumn [name] is a 58 y.o. female.  History of anemia, chronic constipation, obesity.  Presents the ER today complaining of rectal bleeding and anemia.  She was advised to come in by her GI doctor.  She states been having some blood with wiping for 3 months but over the last several days it is more.  She states over time she has bowel movements soft and mixed with blood in stool.  She having left lower quadrant abdominal pain.  The pain is a 6 out of 10 on the pain scale.  It is constant and does not change with bowel movements.  She denies fever or chills, no nausea or vomiting.  She is not on blood thinners.   HPI     Prior to Admission medications   Medication Sig Start Date End Date Taking? Authorizing Provider  albuterol  (VENTOLIN  HFA) 108 (90 Base) MCG/ACT inhaler Inhale 2 puffs into the lungs every 6 (six) hours as needed for wheezing or shortness of breath. 09/01/22  Yes Del Orbe Polanco, Hilario, FNP  busPIRone  (BUSPAR ) 15 MG tablet Take 1 tablet (15 mg total) by mouth 2 (two) times daily as needed. Patient taking differently: Take 15 mg by mouth 2 (two) times daily as needed (anxiety). 05/26/23  Yes Del Orbe Polanco, Hilario, FNP  Cholecalciferol (VITAMIN D3) 125 MCG (5000 UT) CAPS Take 1 capsule (5,000 Units total) by mouth daily. 05/26/23  Yes Del Orbe Polanco, Hilario, FNP  escitalopram  (LEXAPRO ) 10 MG tablet Take 1 tablet (10 mg total) by mouth daily. 05/26/23  Yes Del Orbe Polanco, Hilario, FNP  glipiZIDE  (GLUCOTROL ) 10 MG tablet Take 1 tablet (10 mg total) by mouth daily before breakfast. 07/06/23  Yes Del Orbe Polanco, Iliana, FNP  labetalol  (NORMODYNE ) 200 MG tablet Take 1 tablet (200 mg total) by mouth 2 (two) times daily. 05/10/23  Yes Del Orbe Polanco, Hilario, FNP  metFORMIN  (GLUCOPHAGE ) 1000 MG tablet Take 1  tablet (1,000 mg total) by mouth 2 (two) times daily with a meal. 10/21/22  Yes Del Wilhelmena Falter, Iliana, FNP  Omega 3 1000 MG CAPS Take 2 capsules (2,000 mg total) by mouth daily. 05/26/23 02/20/24 Yes Del Orbe Polanco, Iliana, FNP  omega-3 acid ethyl esters (LOVAZA) 1 g capsule Take 2 capsules by mouth 2 (two) times daily. 05/25/23  Yes [provider]  omeprazole  (PRILOSEC) 20 MG capsule Take 20 mg by mouth 2 (two) times daily. Patient taking differently: Take 20 mg by mouth daily. 08/04/23  Yes [provider]  pravastatin  (PRAVACHOL ) 80 MG tablet Take 1 tablet (80 mg total) by mouth daily. 09/01/22  Yes Del Orbe Polanco, Hilario, FNP  zolpidem  (AMBIEN ) 5 MG tablet Take 1 tablet (5 mg total) by mouth at bedtime as needed for sleep. 05/26/23  Yes Del Wilhelmena Falter, Hilario, FNP  ACCU-CHEK GUIDE test strip USE TO CHECK BLOOD SUGAR UP TO TWICE DAILY 09/05/21   [provider]  Accu-Chek Softclix Lancets lancets Use as instructed 06/17/23   Del Wilhelmena Falter Hilario, FNP  Blood Glucose Monitoring Suppl (ACCU-CHEK GUIDE ME) w/Device KIT Use once daily 06/22/23   Del Orbe Polanco, Iliana, FNP    Allergies: Bee venom, Tramadol, Asa [aspirin], Latex, and Peppermint flavoring agent (non-screening)    Review of Systems  Updated Vital Signs BP (!) 144/79 (BP Location:  Right Arm)   Pulse 88   Temp 99 F (37.2 C) (Oral)   Resp 20   Ht 5' 4 (1.626 m)   Wt 114.8 kg   LMP 11/11/2016   SpO2 99%   BMI 43.43 kg/m   Physical Exam Vitals and nursing note reviewed.  Constitutional:      General: She is not in acute distress.    Appearance: She is well-developed.  HENT:     Head: Normocephalic and atraumatic.  Eyes:     Extraocular Movements: Extraocular movements intact.     Conjunctiva/sclera: Conjunctivae normal.     Pupils: Pupils are equal, round, and reactive to light.  Cardiovascular:     Rate and Rhythm: Normal rate and regular rhythm.     Heart sounds: No murmur  heard. Pulmonary:     Effort: Pulmonary effort is normal. No respiratory distress.     Breath sounds: Normal breath sounds.  Abdominal:     Palpations: Abdomen is soft.     Tenderness: There is abdominal tenderness in the left lower quadrant. There is no guarding.  Genitourinary:    Comments: Frank blood on rectal exam ED tech to chaperone. Musculoskeletal:        General: No swelling.     Cervical back: Neck supple.  Skin:    General: Skin is warm and dry.     Capillary Refill: Capillary refill takes less than 2 seconds.  Neurological:     General: No focal deficit present.     Mental Status: She is alert and oriented to person, place, and time.  Psychiatric:        Mood and Affect: Mood normal.     (all labs ordered are listed, but only abnormal results are displayed) Labs Reviewed  CBC WITH DIFFERENTIAL/PLATELET - Abnormal; Notable for the following components:      Result Value   Hemoglobin 10.0 (*)    HCT 32.3 (*)    MCV 71.9 (*)    MCH 22.3 (*)    Platelets 459 (*)    All other components within normal limits  COMPREHENSIVE METABOLIC PANEL WITH GFR - Abnormal; Notable for the following components:   Glucose, Bld 113 (*)    All other components within normal limits  CBG MONITORING, ED - Abnormal; Notable for the following components:   Glucose-Capillary 110 (*)    All other components within normal limits    EKG: None  Radiology: No results found.   Procedures   Medications Ordered in the ED - No data to display                                  Medical Decision Making During vitals includes but limited to bleeding hemorrhoids, diverticular bleed, diverticulitis, upper GI bleed,  Course: Patient here for rectal bleeding that has been worsening.  She does have an external hemorrhoid but has frank blood on rectal exam is having more bleeding than she is having initially with her hemorrhoids.  She having blood mixed with stool when she has bowel movements.   She left a monitor mild blood in the toilet after bowel movement in the ER today with no stool in it.  Will order an show hemoglobin 10.0 which is decreased from 10.96 days ago.  She states she is feeling tired and dizzy but is not hypotensive or tachycardic  Given left lower quadrant tenderness will obtain CT to rule  out diverticulitis or other acute process then will likely call GI to see if they feel she needs more urgent testing such as a colonoscopy or stable for discharge and close outpatient follow-up.  It is not on blood thinners.  Signed  out to Julie Idol, PA-C  Amount and/or Complexity of Data Reviewed Labs: ordered. Radiology: ordered.  Risk Prescription drug management.        Final diagnoses:  None    ED Discharge Orders     None          Suellen Sherran DELENA DEVONNA 09/02/23 [id number]    Melvenia Motto, MD 09/07/23 [phone]

## 2023-09-01 NOTE — ED Provider Notes (Signed)
 Patient signed out to me from [facility], [place] pending CT abdomen pelvis to rule out diverticulitis or other source of GI bleeding.  She has a chronic hemorrhoid and has been noting GI bleeding associated with a hemorrhoid for the past 3 months but has been worse the past several days.  She endorses drips of blood into the toilet bowl even when she is not straining but simply urinating.  Today she felt weak and was advised to come here for evaluation.  Ultimate plan is for possible banding of her hemorrhoids.  Her hemoglobin is 10.0 today, down from 10.9 6 days ago.    CT is negative for diverticulitis or other obvious source of lower GI bleed.  Of note, patient is motivated to be discharged home this evening.  Call placed to Dr. Shaaron for further advice regarding today's findings. Discussed pt's recent history, labs, CT imaging.  Agrees with close outpatient followup.      Results for orders placed or performed during the hospital encounter of 09/01/23  CBC with Differential   Collection Time: 09/01/23  3:25 PM  Result Value Ref Range   WBC 8.7 4.0 - 10.5 K/uL   RBC 4.49 3.87 - 5.11 MIL/uL   Hemoglobin 10.0 (L) 12.0 - 15.0 g/dL   HCT 67.6 (L) 63.9 - 53.9 %   MCV 71.9 (L) 80.0 - 100.0 fL   MCH 22.3 (L) 26.0 - 34.0 pg   MCHC 31.0 30.0 - 36.0 g/dL   RDW 84.4 88.4 - 84.4 %   Platelets 459 (H) 150 - 400 K/uL   nRBC 0.0 0.0 - 0.2 %   Neutrophils Relative % 51 %   Neutro Abs 4.4 1.7 - 7.7 K/uL   Lymphocytes Relative 36 %   Lymphs Abs 3.2 0.7 - 4.0 K/uL   Monocytes Relative 9 %   Monocytes Absolute 0.8 0.1 - 1.0 K/uL   Eosinophils Relative 3 %   Eosinophils Absolute 0.2 0.0 - 0.5 K/uL   Basophils Relative 1 %   Basophils Absolute 0.1 0.0 - 0.1 K/uL   Immature Granulocytes 0 %   Abs Immature Granulocytes 0.03 0.00 - 0.07 K/uL  Comprehensive metabolic panel   Collection Time: 09/01/23  3:25 PM  Result Value Ref Range   Sodium 137 135 - 145 mmol/L   Potassium 4.2 3.5 - 5.1 mmol/L    Chloride 104 98 - 111 mmol/L   CO2 22 22 - 32 mmol/L   Glucose, Bld 113 (H) 70 - 99 mg/dL   BUN 11 6 - 20 mg/dL   Creatinine, Ser 9.17 0.44 - 1.00 mg/dL   Calcium 9.2 8.9 - 89.6 mg/dL   Total Protein 7.0 6.5 - 8.1 g/dL   Albumin 3.5 3.5 - 5.0 g/dL   AST 25 15 - 41 U/L   ALT 20 0 - 44 U/L   Alkaline Phosphatase 74 38 - 126 U/L   Total Bilirubin 0.7 0.0 - 1.2 mg/dL   GFR, Estimated >39 >39 mL/min   Anion gap 11 5 - 15  CBG monitoring, ED   Collection Time: 09/01/23  3:31 PM  Result Value Ref Range   Glucose-Capillary 110 (H) 70 - 99 mg/dL   CT ABDOMEN PELVIS W CONTRAST Result Date: 09/01/2023 CLINICAL DATA:  Dark stools, left lower quadrant pain, dizziness EXAM: CT ABDOMEN AND PELVIS WITH CONTRAST TECHNIQUE: Multidetector CT imaging of the abdomen and pelvis was performed using the standard protocol following bolus administration of intravenous contrast. RADIATION DOSE REDUCTION: This exam  was performed according to the departmental dose-optimization program which includes automated exposure control, adjustment of the mA and/or kV according to patient size and/or use of iterative reconstruction technique. CONTRAST:  OMNIPAQUE  IOHEXOL  300 MG/ML  SOLN COMPARISON:  07/11/2021 FINDINGS: Lower chest: No acute pleural or parenchymal lung disease. Hepatobiliary: Several subcentimeter hypodensities within the liver are stable, likely small cysts. No biliary duct dilation. Gallbladder is unremarkable. Pancreas: Unremarkable. No pancreatic ductal dilatation or surrounding inflammatory changes. Spleen: Normal in size without focal abnormality. Adrenals/Urinary Tract: Adrenal glands are unremarkable. Kidneys are normal, without renal calculi, focal lesion, or hydronephrosis. Bladder is unremarkable. Stomach/Bowel: No bowel obstruction or ileus. Normal appendix right lower quadrant. Scattered colonic diverticulosis without diverticulitis. No bowel wall thickening or inflammatory change. Vascular/Lymphatic:  No significant vascular findings are present. No enlarged abdominal or pelvic lymph nodes. Reproductive: Multiple uterine fibroids are again noted. No adnexal masses. Other: No free fluid or free intraperitoneal gas. No abdominal wall hernia. Musculoskeletal: No acute or destructive bony abnormalities. Reconstructed images demonstrate no additional findings. IMPRESSION: 1. No acute intra-abdominal or intrapelvic process. Normal appendix. 2. Scattered colonic diverticulosis without diverticulitis. 3. Fibroid uterus. Electronically Signed   By: Ozell Daring M.D.   On: 09/01/2023 20:38      Kagen Kunath, PA-C 09/01/23 2237    Melvenia Motto, MD 09/02/23 [phone]

## 2023-09-01 NOTE — ED Triage Notes (Signed)
 Pt noted dark stools, first time was about 3 months and had stopped.  Pt states dark stools have returned.  + abd pain on left side. + dizziness

## 2023-09-02 ENCOUNTER — Other Ambulatory Visit: Payer: Self-pay | Admitting: *Deleted

## 2023-09-02 ENCOUNTER — Telehealth (INDEPENDENT_AMBULATORY_CARE_PROVIDER_SITE_OTHER): Payer: Self-pay | Admitting: Internal Medicine

## 2023-09-02 ENCOUNTER — Encounter: Payer: Self-pay | Admitting: *Deleted

## 2023-09-02 ENCOUNTER — Telehealth: Payer: Self-pay | Admitting: Gastroenterology

## 2023-09-02 MED ORDER — PEG 3350-KCL-NA BICARB-NACL 420 G PO SOLR: 4000.0000 mL | Freq: Once | ORAL | 0 refills | Status: AC

## 2023-09-02 NOTE — Telephone Encounter (Signed)
 Pt has been scheduled for 09/06/23. Instructions will be picked up by pt from office and prep sent to pharmacy.

## 2023-09-02 NOTE — Telephone Encounter (Signed)
 Pt informed that her pre-op is tomorrow Friday 09/03/23 at 2:15 pm. Place to Register: [facility] Main Entrance

## 2023-09-02 NOTE — Patient Instructions (Signed)
 Autumn [name]  09/02/2023     @PREFPERIOPPHARMACY @   Your procedure is scheduled on  09/06/2023.   Report to [facility] at  6:00 A.M.   Call this number if you have problems the morning of surgery:  [phone]  If you experience any cold or flu symptoms such as cough, fever, chills, shortness of breath, etc. between now and your scheduled surgery, please notify us  at the above number.   Remember:        DO NOT take any medications for diabetes the morning of your procedure.   Follow the diet and prep instructions given to you by the office.    You may drink clear liquids until 4:15 am on 09/06/2023.    Clear liquids allowed are:                    Water , Juice (No red color; non-citric and without pulp; diabetics please choose diet or no sugar options), Carbonated beverages (diabetics please choose diet or no sugar options), Clear Tea (No creamer, milk, or cream, including half & half and powdered creamer), Black Coffee Only (No creamer, milk or cream, including half & half and powdered creamer), and Clear Sports drink (No red color; diabetics please choose diet or no sugar options)    Take these medicines the morning of surgery with A SIP OF WATER                  buspirone , escitalopram , labetolol, omeprazole .    Do not wear jewelry, make-up or nail polish, including gel polish,  artificial nails, or any other type of covering on natural nails (fingers and  toes).  Do not wear lotions, powders, or perfumes, or deodorant.  Do not shave 48 hours prior to surgery.  Men may shave face and neck.  Do not bring valuables to the hospital.  [facility] is not responsible for any belongings or valuables.  Contacts, dentures or bridgework may not be worn into surgery.  Leave your suitcase in the car.  After surgery it may be brought to your room.  For patients admitted to the hospital, discharge time will be determined by your treatment team.  Patients discharged the  day of surgery will not be allowed to drive home and must have someone with them for 24 hours.    Special instructions:   DO NOT smoke tobacco or vape for 24 hours before your procedure.  Please read over the following fact sheets that you were given. Anesthesia Post-op Instructions and Care and Recovery After Surgery      Colonoscopy, Adult, Care After The following information offers guidance on how to care for yourself after your procedure. Your health care provider may also give you more specific instructions. If you have problems or questions, contact your health care provider. What can I expect after the procedure? After the procedure, it is common to have: A small amount of blood in your stool for 24 hours after the procedure. Some gas. Mild cramping or bloating of your abdomen. Follow these instructions at home: Eating and drinking  Drink enough fluid to keep your urine pale yellow. Follow instructions from your health care provider about eating or drinking restrictions. Resume your normal diet as told by your health care provider. Avoid heavy or fried foods that are hard to digest. Activity Rest as told by your health care provider. Avoid sitting for a long time without moving. Get up to take short  walks every 1-2 hours. This is important to improve blood flow and breathing. Ask for help if you feel weak or unsteady. Return to your normal activities as told by your health care provider. Ask your health care provider what activities are safe for you. Managing cramping and bloating  Try walking around when you have cramps or feel bloated. If directed, apply heat to your abdomen as told by your health care provider. Use the heat source that your health care provider recommends, such as a moist heat pack or a heating pad. Place a towel between your skin and the heat source. Leave the heat on for 20-30 minutes. Remove the heat if your skin turns bright red. This is especially  important if you are unable to feel pain, heat, or cold. You have a greater risk of getting burned. General instructions If you were given a sedative during the procedure, it can affect you for several hours. Do not drive or operate machinery until your health care provider says that it is safe. For the first 24 hours after the procedure: Do not sign important documents. Do not drink alcohol. Do your regular daily activities at a slower pace than normal. Eat soft foods that are easy to digest. Take over-the-counter and prescription medicines only as told by your health care provider. Keep all follow-up visits. This is important. Contact a health care provider if: You have blood in your stool 2-3 days after the procedure. Get help right away if: You have more than a small spotting of blood in your stool. You have large blood clots in your stool. You have swelling of your abdomen. You have nausea or vomiting. You have a fever. You have increasing pain in your abdomen that is not relieved with medicine. These symptoms may be an emergency. Get help right away. Call 911. Do not wait to see if the symptoms will go away. Do not drive yourself to the hospital. Summary After the procedure, it is common to have a small amount of blood in your stool. You may also have mild cramping and bloating of your abdomen. If you were given a sedative during the procedure, it can affect you for several hours. Do not drive or operate machinery until your health care provider says that it is safe. Get help right away if you have a lot of blood in your stool, nausea or vomiting, a fever, or increased pain in your abdomen. This information is not intended to replace advice given to you by your health care provider. Make sure you discuss any questions you have with your health care provider. Document Revised: 02/24/2022 Document Reviewed: 09/04/2020 Elsevier Patient Education  2024 [employer] Inc.General Anesthesia,  Adult, Care After The following information offers guidance on how to care for yourself after your procedure. Your health care provider may also give you more specific instructions. If you have problems or questions, contact your health care provider. What can I expect after the procedure? After the procedure, it is common for people to: Have pain or discomfort at the IV site. Have nausea or vomiting. Have a sore throat or hoarseness. Have trouble concentrating. Feel cold or chills. Feel weak, sleepy, or tired (fatigue). Have soreness and body aches. These can affect parts of the body that were not involved in surgery. Follow these instructions at home: For the time period you were told by your health care provider:  Rest. Do not participate in activities where you could fall or become injured. Do not drive  or use machinery. Do not drink alcohol. Do not take sleeping pills or medicines that cause drowsiness. Do not make important decisions or sign legal documents. Do not take care of children on your own. General instructions Drink enough fluid to keep your urine pale yellow. If you have sleep apnea, surgery and certain medicines can increase your risk for breathing problems. Follow instructions from your health care provider about wearing your sleep device: Anytime you are sleeping, including during daytime naps. While taking prescription pain medicines, sleeping medicines, or medicines that make you drowsy. Return to your normal activities as told by your health care provider. Ask your health care provider what activities are safe for you. Take over-the-counter and prescription medicines only as told by your health care provider. Do not use any products that contain nicotine or tobacco. These products include cigarettes, chewing tobacco, and vaping devices, such as e-cigarettes. These can delay incision healing after surgery. If you need help quitting, ask your health care  provider. Contact a health care provider if: You have nausea or vomiting that does not get better with medicine. You vomit every time you eat or drink. You have pain that does not get better with medicine. You cannot urinate or have bloody urine. You develop a skin rash. You have a fever. Get help right away if: You have trouble breathing. You have chest pain. You vomit blood. These symptoms may be an emergency. Get help right away. Call 911. Do not wait to see if the symptoms will go away. Do not drive yourself to the hospital. Summary After the procedure, it is common to have a sore throat, hoarseness, nausea, vomiting, or to feel weak, sleepy, or fatigue. For the time period you were told by your health care provider, do not drive or use machinery. Get help right away if you have difficulty breathing, have chest pain, or vomit blood. These symptoms may be an emergency. This information is not intended to replace advice given to you by your health care provider. Make sure you discuss any questions you have with your health care provider. Document Revised: 04/11/2021 Document Reviewed: 04/11/2021 Elsevier Patient Education  2024 [employer].

## 2023-09-02 NOTE — Telephone Encounter (Signed)
 [employer] PA: Notification or Prior Authorization is not required for the requested services You are not required to submit a notification/prior authorization based on the information provided. If you have general questions about the prior authorization requirements, visit [email or web address] > Clinician Resources > Advance and Admission Notification Requirements. The number above acknowledges your notification. Please write this reference number down for future reference. If you would like to request an organization determination, please call us  at [phone]. Decision ID #: [id number]

## 2023-09-02 NOTE — Telephone Encounter (Signed)
 Instructions left for pick up

## 2023-09-02 NOTE — Telephone Encounter (Signed)
 Pt has been scheduled for procedure. See previous TE

## 2023-09-02 NOTE — Telephone Encounter (Signed)
 Pt returning call. [phone]

## 2023-09-02 NOTE — Telephone Encounter (Signed)
 Patient seen in the ER yesterday for ongoing rectal bleeding. Hgb has declined from 11.6, 3 months ago to 10.93 days ago, and was 10.0 yesterday in the ER.  CT A/P with contrast in the ER with no acute abnormalities.  She was noted to have frank blood on rectal exam in the ER.  Case was discussed with Dr. Shaaron and ultimately decided for close outpatient follow-up.  I spoke with Dr. Shaaron today who recommended updating her colonoscopy.  I did discuss this possibility with her yesterday.  I have tried to reach her again today to let her know that we can go ahead and move forward with scheduling a colonoscopy ASAP, but I have not been able to reach her.  Mindy/Tammy: Please reach patient and let her know that I would like to go ahead and proceed with scheduling her for colonoscopy ASAP to evaluate her ongoing rectal bleeding.  Please schedule her with Dr. Cindie as soon as possible. ASA 3 1 day prior to procedure: Hold evening dose of metformin , take glipizide  as prescribed. Day of procedure: No morning diabetes medications

## 2023-09-03 ENCOUNTER — Encounter (HOSPITAL_COMMUNITY): Payer: Self-pay

## 2023-09-03 ENCOUNTER — Encounter (HOSPITAL_COMMUNITY)
Admission: RE | Admit: 2023-09-03 | Discharge: 2023-09-03 | Disposition: A | Discharge status: Home / Self Care | Source: Ambulatory Visit | Attending: Internal Medicine | Admitting: Internal Medicine

## 2023-09-03 ENCOUNTER — Other Ambulatory Visit: Payer: Self-pay

## 2023-09-03 VITALS — BP 93/78 | HR 73 | Resp 18 | Ht 64.0 in | Wt 253.0 lb

## 2023-09-03 DIAGNOSIS — F17290 Nicotine dependence, other tobacco product, uncomplicated: Secondary | ICD-10-CM | POA: Insufficient documentation

## 2023-09-03 DIAGNOSIS — Z01810 Encounter for preprocedural cardiovascular examination: Secondary | ICD-10-CM | POA: Diagnosis not present

## 2023-09-03 DIAGNOSIS — Z6841 Body Mass Index (BMI) 40.0 and over, adult: Secondary | ICD-10-CM | POA: Insufficient documentation

## 2023-09-03 DIAGNOSIS — F17200 Nicotine dependence, unspecified, uncomplicated: Secondary | ICD-10-CM

## 2023-09-03 DIAGNOSIS — I10 Essential (primary) hypertension: Secondary | ICD-10-CM | POA: Diagnosis not present

## 2023-09-03 DIAGNOSIS — E119 Type 2 diabetes mellitus without complications: Secondary | ICD-10-CM

## 2023-09-06 ENCOUNTER — Other Ambulatory Visit: Payer: Self-pay

## 2023-09-06 ENCOUNTER — Ambulatory Visit (HOSPITAL_BASED_OUTPATIENT_CLINIC_OR_DEPARTMENT_OTHER): Admitting: Anesthesiology

## 2023-09-06 ENCOUNTER — Encounter (HOSPITAL_COMMUNITY): Payer: Self-pay | Admitting: Internal Medicine

## 2023-09-06 ENCOUNTER — Encounter (HOSPITAL_COMMUNITY)
Admission: RE | Disposition: A | Discharge status: Home / Self Care | Payer: Self-pay | Source: Home / Self Care | Attending: Internal Medicine

## 2023-09-06 ENCOUNTER — Ambulatory Visit (HOSPITAL_COMMUNITY): Admitting: Anesthesiology

## 2023-09-06 ENCOUNTER — Telehealth: Payer: Self-pay | Admitting: Gastroenterology

## 2023-09-06 ENCOUNTER — Ambulatory Visit (HOSPITAL_COMMUNITY)
Admission: RE | Admit: 2023-09-06 | Discharge: 2023-09-06 | Disposition: A | Discharge status: Home / Self Care | Attending: Internal Medicine | Admitting: Internal Medicine

## 2023-09-06 DIAGNOSIS — D124 Benign neoplasm of descending colon: Secondary | ICD-10-CM

## 2023-09-06 DIAGNOSIS — K644 Residual hemorrhoidal skin tags: Secondary | ICD-10-CM

## 2023-09-06 DIAGNOSIS — K219 Gastro-esophageal reflux disease without esophagitis: Secondary | ICD-10-CM | POA: Diagnosis not present

## 2023-09-06 DIAGNOSIS — I10 Essential (primary) hypertension: Secondary | ICD-10-CM

## 2023-09-06 DIAGNOSIS — Z6841 Body Mass Index (BMI) 40.0 and over, adult: Secondary | ICD-10-CM | POA: Insufficient documentation

## 2023-09-06 DIAGNOSIS — K625 Hemorrhage of anus and rectum: Secondary | ICD-10-CM | POA: Insufficient documentation

## 2023-09-06 DIAGNOSIS — E119 Type 2 diabetes mellitus without complications: Secondary | ICD-10-CM | POA: Insufficient documentation

## 2023-09-06 DIAGNOSIS — M1990 Unspecified osteoarthritis, unspecified site: Secondary | ICD-10-CM | POA: Diagnosis not present

## 2023-09-06 DIAGNOSIS — E66813 Obesity, class 3: Secondary | ICD-10-CM | POA: Diagnosis not present

## 2023-09-06 DIAGNOSIS — F17200 Nicotine dependence, unspecified, uncomplicated: Secondary | ICD-10-CM

## 2023-09-06 DIAGNOSIS — K648 Other hemorrhoids: Secondary | ICD-10-CM

## 2023-09-06 HISTORY — PX: COLONOSCOPY: SHX5424

## 2023-09-06 LAB — GLUCOSE, CAPILLARY: Glucose-Capillary: 113 mg/dL — ABNORMAL HIGH (ref 70–99)

## 2023-09-06 SURGERY — COLONOSCOPY
Anesthesia: General

## 2023-09-06 MED ORDER — LACTATED RINGERS IV SOLN: INTRAVENOUS | Status: DC

## 2023-09-06 MED ORDER — PROPOFOL 10 MG/ML IV BOLUS
INTRAVENOUS | Status: DC | PRN
Start: 2023-09-06 — End: 2023-09-06
  Administered 2023-09-06 (×2): 100 mg via INTRAVENOUS

## 2023-09-06 MED ORDER — PROPOFOL 500 MG/50ML IV EMUL
INTRAVENOUS | Status: DC | PRN
  Administered 2023-09-06 (×2): 200 ug/kg/min via INTRAVENOUS

## 2023-09-06 MED ORDER — LACTATED RINGERS IV SOLN: INTRAVENOUS | Status: DC | PRN

## 2023-09-06 NOTE — Op Note (Signed)
 [facility] Patient Name: Autumn [name] Procedure Date: 09/06/2023 7:42 AM MRN: [id number] Date of Birth: 05-May-1965 Attending MD: Carlin POUR. Cindie , [place], [id number] CSN: [id number] Age: 58 Admit Type: Outpatient Procedure:                Colonoscopy Indications:              Rectal bleeding Providers:                Carlin POUR. Cindie, DO, Leandrew Edelman RN, RN, Bascom Blush Referring MD:              Medicines:                See the Anesthesia note for documentation of the                            administered medications Complications:            No immediate complications. Estimated Blood Loss:     Estimated blood loss was minimal. Procedure:                Pre-Anesthesia Assessment:                           - The anesthesia plan was to use monitored                            anesthesia care (MAC).                           After obtaining informed consent, the colonoscope                            was passed under direct vision. Throughout the                            procedure, the patient's blood pressure, pulse, and                            oxygen saturations were monitored continuously. The                            [id number]-[id number] ([id number]) scope was introduced through                            the anus and advanced to the the terminal ileum,                            with identification of the appendiceal orifice and                            IC valve. The colonoscopy was performed without                            difficulty. The patient tolerated the procedure  well. The quality of the bowel preparation was                            evaluated using the BBPS [facility] Bowel Preparation                            Scale) with scores of: Right Colon = 3, Transverse                            Colon = 3 and Left Colon = 3 (entire mucosa seen                            well with no residual staining, small  fragments of                            stool or opaque liquid). The total BBPS score                            equals 9. Scope In: 8:03:20 AM Scope Out: 8:15:39 AM Scope Withdrawal Time: 0 hours 9 minutes 15 seconds  Total Procedure Duration: 0 hours 12 minutes 19 seconds  Findings:      Hemorrhoids were found on perianal exam.      Non-bleeding internal hemorrhoids were found during retroflexion. The       hemorrhoids were moderate.      Two sessile polyps were found in the descending colon. The polyps were 4       to 6 mm in size. These polyps were removed with a cold snare. Resection       and retrieval were complete.      The exam was otherwise without abnormality. Impression:               - Hemorrhoids found on perianal exam.                           - Non-bleeding internal hemorrhoids.                           - Two 4 to 6 mm polyps in the descending colon,                            removed with a cold snare. Resected and retrieved.                           - The examination was otherwise normal. Moderate Sedation:      Per Anesthesia Care Recommendation:           - Patient has a contact number available for                            emergencies. The signs and symptoms of potential                            delayed complications were discussed with the  patient. Return to normal activities tomorrow.                            Written discharge instructions were provided to the                            patient.                           - Resume previous diet.                           - Continue present medications.                           - Await pathology results.                           - Repeat colonoscopy in 7-10 years depending on                            pathology results for surveillance.                           - Return to GI clinic at the next available                            appointment for hemorrhoid  banding Procedure Code(s):        --- Professional ---                           [phone], Colonoscopy, flexible; with removal of                            tumor(s), polyp(s), or other lesion(s) by snare                            technique Diagnosis Code(s):        --- Professional ---                           D12.4, Benign neoplasm of descending colon                           K64.8, Other hemorrhoids                           K62.5, Hemorrhage of anus and rectum CPT copyright 2022 American Medical Association. All rights reserved. The codes documented in this report are preliminary and upon coder review may  be revised to meet current compliance requirements. Carlin POUR. Cindie, DO Carlin POUR. Cindie, DO 09/06/2023 8:20:13 AM This report has been signed electronically. Number of Addenda: 0

## 2023-09-06 NOTE — Telephone Encounter (Signed)
 Autumn [name]: patient has appt on 9/11 with me. Can we see if there is anything sooner in August? Can put her in any slot with me (doesn't have to be banding).

## 2023-09-06 NOTE — Transfer of Care (Signed)
 Immediate Anesthesia Transfer of Care Note  Patient: Autumn [name]  Procedure(s) Performed: COLONOSCOPY  Patient Location: Short Stay  Anesthesia Type:General  Level of Consciousness: awake, alert , oriented, and patient cooperative  Airway & Oxygen Therapy: Patient Spontanous Breathing  Post-op Assessment: Report given to RN, Post -op Vital signs reviewed and stable, and Patient moving all extremities X 4  Post vital signs: Reviewed and stable  Last Vitals:  Vitals Value Taken Time  BP 128/68 09/06/23 08:21  Temp 36.5 C 09/06/23 08:21  Pulse 84 09/06/23 08:21  Resp 18 09/06/23 08:21  SpO2 100 % 09/06/23 08:21    Last Pain:  Vitals:   09/06/23 0821  TempSrc: Oral  PainSc: 0-No pain         Complications: No notable events documented.

## 2023-09-06 NOTE — Anesthesia Preprocedure Evaluation (Addendum)
 Anesthesia Evaluation  Patient identified by MRN, date of birth, ID band Patient awake    Reviewed: Allergy & Precautions, H&P , NPO status , Patient's Chart, lab work & pertinent test results, reviewed documented beta blocker date and time   Airway Mallampati: II  TM Distance: >3 FB Neck ROM: full    Dental  (+) Edentulous Upper, Edentulous Lower   Pulmonary shortness of breath, Current Smoker and Patient abstained from smoking.   Pulmonary exam normal breath sounds clear to auscultation       Cardiovascular Exercise Tolerance: Good hypertension, Normal cardiovascular exam Rhythm:regular Rate:Normal     Neuro/Psych negative neurological ROS  negative psych ROS   GI/Hepatic Neg liver ROS,GERD  ,,Rectal bleeding   Endo/Other  diabetes, Type 2  Class 3 obesity  Renal/GU negative Renal ROS  negative genitourinary   Musculoskeletal  (+) Arthritis , Osteoarthritis,    Abdominal   Peds  Hematology  (+) Blood dyscrasia, anemia Hgb 10   Anesthesia Other Findings   Reproductive/Obstetrics negative OB ROS                              Anesthesia Physical Anesthesia Plan  ASA: 3  Anesthesia Plan: General   Post-op Pain Management: Minimal or no pain anticipated   Induction: Intravenous  PONV Risk Score and Plan: Propofol  infusion  Airway Management Planned: Nasal Cannula and Natural Airway  Additional Equipment: None  Intra-op Plan:   Post-operative Plan:   Informed Consent: I have reviewed the patients History and Physical, chart, labs and discussed the procedure including the risks, benefits and alternatives for the proposed anesthesia with the patient or authorized representative who has indicated his/her understanding and acceptance.     Dental Advisory Given  Plan Discussed with: CRNA  Anesthesia Plan Comments:          Anesthesia Quick Evaluation

## 2023-09-06 NOTE — Discharge Instructions (Addendum)
  Colonoscopy Discharge Instructions  Read the instructions outlined below and refer to this sheet in the next few weeks. These discharge instructions provide you with general information on caring for yourself after you leave the hospital. Your doctor may also give you specific instructions. While your treatment has been planned according to the most current medical practices available, unavoidable complications occasionally occur.   ACTIVITY You may resume your regular activity, but move at a slower pace for the next 24 hours.  Take frequent rest periods for the next 24 hours.  Walking will help get rid of the air and reduce the bloated feeling in your belly (abdomen).  No driving for 24 hours (because of the medicine (anesthesia) used during the test).   Do not sign any important legal documents or operate any machinery for 24 hours (because of the anesthesia used during the test).  NUTRITION Drink plenty of fluids.  You may resume your normal diet as instructed by your doctor.  Begin with a light meal and progress to your normal diet. Heavy or fried foods are harder to digest and may make you feel sick to your stomach (nauseated).  Avoid alcoholic beverages for 24 hours or as instructed.  MEDICATIONS You may resume your normal medications unless your doctor tells you otherwise.  WHAT YOU CAN EXPECT TODAY Some feelings of bloating in the abdomen.  Passage of more gas than usual.  Spotting of blood in your stool or on the toilet paper.  IF YOU HAD POLYPS REMOVED DURING THE COLONOSCOPY: No aspirin products for 7 days or as instructed.  No alcohol for 7 days or as instructed.  Eat a soft diet for the next 24 hours.  FINDING OUT THE RESULTS OF YOUR TEST Not all test results are available during your visit. If your test results are not back during the visit, make an appointment with your caregiver to find out the results. Do not assume everything is normal if you have not heard from your  caregiver or the medical facility. It is important for you to follow up on all of your test results.  SEEK IMMEDIATE MEDICAL ATTENTION IF: You have more than a spotting of blood in your stool.  Your belly is swollen (abdominal distention).  You are nauseated or vomiting.  You have a temperature over 101.  You have abdominal pain or discomfort that is severe or gets worse throughout the day.   Your colonoscopy revealed  polyp(s) which I removed successfully. Await pathology results, my office will contact you. I recommend repeating colonoscopy in  years for surveillance purposes.   You also have moderate sized internal hemorrhoids. I would recommend increasing fiber in your diet or adding OTC Benefiber/Metamucil. Be sure to drink at least 4 to 6 glasses of water  daily. Follow-up with GI next available for hemorrhoid banding.    I hope you have a great rest of your week!  Carlin POUR. Cindie, D.O. Gastroenterology and Hepatology [facility]

## 2023-09-06 NOTE — H&P (Signed)
 Primary Care Physician:  Terry Wilhelmena Lloyd Hilario, FNP Primary Gastroenterologist:  Dr. Cindie  Pre-Procedure History & Physical: HPI:  Autumn [name] is a 58 y.o. female is here for a colonoscopy for rectal bleeding  Past Medical History:  Diagnosis Date   Anxiety    Panic attacks- cries   Arthritis    Complication of anesthesia    had some hallucations   Diabetes mellitus without complication (HCC)    Fibroids, submucosal 12/02/2016   GERD (gastroesophageal reflux disease)    H/O colonoscopy    Hypertension    Rectal bleeding    in setting of hemorrhoids. Colonoscopy in July 2020   Shortness of breath dyspnea    with exertion   Thickened endometrium 12/02/2016    Past Surgical History:  Procedure Laterality Date   BALLOON DILATION N/A 10/03/2020   Procedure: BALLOON DILATION;  Surgeon: Cindie Carlin POUR, DO;  Location: [facility];  Service: Endoscopy;  Laterality: N/A;   BIOPSY  10/03/2020   Procedure: BIOPSY;  Surgeon: Cindie Carlin POUR, DO;  Location: [facility];  Service: Endoscopy;;   BREAST LUMPECTOMY Right    age 15's-benign   CESAREAN SECTION     x3   COLONOSCOPY     COLONOSCOPY WITH PROPOFOL  N/A 08/23/2018   PROPOFOL ;  Surgeon: Harvey Margo LITTIE, MD; three 2-4 mm polyps in the rectum and sigmoid colon, external and internal hemorrhoids, tortuous left colon.  Pathology with 3 hyperplastic polyps.  Next colonoscopy in 2030.   DENTAL SURGERY     ESOPHAGOGASTRODUODENOSCOPY (EGD) WITH PROPOFOL  N/A 10/03/2020   Surgeon: Cindie Carlin POUR, DO; Normal esophagus, Gastritis biopsied (nonspecific reactive gastropathy, negative for H. pylori), normal examined duodenum.   MASS EXCISION N/A 12/06/2017   Procedure: EXCISION 1CM CYST ON FACE;  Surgeon: Kallie Manuelita BROCKS, MD;  Location: [facility];  Service: General;  Laterality: N/A;   MULTIPLE EXTRACTIONS WITH ALVEOLOPLASTY Bilateral 04/12/2015   Procedure: BILATERAL MULTIPLE EXTRACTIONS NUMBERS TWENTY, TWENTY ONE,  TWENTY TWO, TWENTY THREE, TWENTY FOUR, TWENTY FIVE, TWENTY EIGHT, TWENTY NINE WITH ALVEOLOPLASTY;  Surgeon: Glendia Primrose, DDS;  Location: [facility];  Service: Oral Surgery;  Laterality: Bilateral;   POLYPECTOMY  08/23/2018   Procedure: POLYPECTOMY;  Surgeon: Harvey Margo LITTIE, MD;  Location: [facility];  Service: Endoscopy;;   TUBAL LIGATION      Prior to Admission medications   Medication Sig Start Date End Date Taking? Authorizing Provider  labetalol  (NORMODYNE ) 200 MG tablet Take 1 tablet (200 mg total) by mouth 2 (two) times daily. 05/10/23  Yes Del Wilhelmena Lloyd, Hilario, FNP  ACCU-CHEK GUIDE test strip USE TO CHECK BLOOD SUGAR UP TO TWICE DAILY 09/05/21   [provider]  Accu-Chek Softclix Lancets lancets Use as instructed 06/17/23   Del Wilhelmena Lloyd, Hilario, FNP  albuterol  (VENTOLIN  HFA) 108 (90 Base) MCG/ACT inhaler Inhale 2 puffs into the lungs every 6 (six) hours as needed for wheezing or shortness of breath. 09/01/22   Del Wilhelmena Lloyd Hilario, FNP  Blood Glucose Monitoring Suppl (ACCU-CHEK GUIDE ME) w/Device KIT Use once daily 06/22/23   Del Orbe Polanco, Iliana, FNP  busPIRone  (BUSPAR ) 15 MG tablet Take 1 tablet (15 mg total) by mouth 2 (two) times daily as needed. Patient taking differently: Take 15 mg by mouth 2 (two) times daily as needed (anxiety). 05/26/23   Del Orbe Polanco, Iliana, FNP  Cholecalciferol (VITAMIN D3) 125 MCG (5000 UT) CAPS Take 1 capsule (5,000 Units total) by mouth daily. 05/26/23  Del Wilhelmena Falter, [place], FNP  escitalopram  (LEXAPRO ) 10 MG tablet Take 1 tablet (10 mg total) by mouth daily. 05/26/23   Del Wilhelmena Falter Sola, FNP  glipiZIDE  (GLUCOTROL ) 10 MG tablet Take 1 tablet (10 mg total) by mouth daily before breakfast. 07/06/23   Del Orbe Polanco, Iliana, FNP  metFORMIN  (GLUCOPHAGE ) 1000 MG tablet Take 1 tablet (1,000 mg total) by mouth 2 (two) times daily with a meal. 10/21/22   Del Wilhelmena Falter, Sola, FNP  Omega 3 1000 MG CAPS Take 2 capsules (2,000 mg  total) by mouth daily. 05/26/23 02/20/24  Del Orbe Polanco, Iliana, FNP  omega-3 acid ethyl esters (LOVAZA) 1 g capsule Take 2 capsules by mouth 2 (two) times daily. 05/25/23   [provider]  omeprazole  (PRILOSEC) 20 MG capsule Take 20 mg by mouth 2 (two) times daily. Patient taking differently: Take 20 mg by mouth daily. 08/04/23   [provider]  pravastatin  (PRAVACHOL ) 80 MG tablet Take 1 tablet (80 mg total) by mouth daily. 09/01/22   Del Wilhelmena Falter Sola, FNP  zolpidem  (AMBIEN ) 5 MG tablet Take 1 tablet (5 mg total) by mouth at bedtime as needed for sleep. 05/26/23   Del Wilhelmena Falter Sola, FNP    Allergies as of 09/02/2023 - Review Complete 09/01/2023  Allergen Reaction Noted   Bee venom Anaphylaxis 11/04/2010   Tramadol Hives 11/04/2010   Asa [aspirin] Other (See Comments) 08/14/2015   Latex Itching 12/26/2018   Peppermint flavoring agent (non-screening) Other (See Comments) 03/01/2018    Family History  Adopted: Yes  Problem Relation Age of Onset   Diabetes Daughter    Heart disease Other    Colon cancer Neg Hx     Social History   Socioeconomic History   Marital status: Significant Other    Spouse name: Not on file   Number of children: Not on file   Years of education: Not on file   Highest education level: Not on file  Occupational History   Not on file  Tobacco Use   Smoking status: Every Day    Current packs/day: 1.00    Average packs/day: 1 pack/day for 31.0 years (31.0 ttl pk-yrs)    Types: Cigarettes   Smokeless tobacco: Never  Vaping Use   Vaping status: Never Used  Substance and Sexual Activity   Alcohol use: Yes    Comment: occ   Drug use: No   Sexual activity: Yes    Birth control/protection: Surgical    Comment: tubal  Other Topics Concern   Not on file  Social History Narrative   Not on file   Social Drivers of Health   Financial Resource Strain: Low Risk  (08/04/2023)   Overall Financial Resource Strain (CARDIA)     Difficulty of Paying Living Expenses: Not hard at all  Food Insecurity: No Food Insecurity (08/04/2023)   Hunger Vital Sign    Worried About Running Out of Food in the Last Year: Never true    Ran Out of Food in the Last Year: Never true  Transportation Needs: No Transportation Needs (08/04/2023)   PRAPARE - Administrator, Civil Service (Medical): No    Lack of Transportation (Non-Medical): No  Physical Activity: Sufficiently Active (08/04/2023)   Exercise Vital Sign    Days of Exercise per Week: 7 days    Minutes of Exercise per Session: 30 min  Stress: No Stress Concern Present (08/04/2023)   [employer] of Occupational Health - Occupational Stress Questionnaire  Feeling of Stress: Not at all  Social Connections: Moderately Isolated (08/04/2023)   Social Connection and Isolation Panel    Frequency of Communication with Friends and Family: More than three times a week    Frequency of Social Gatherings with Friends and Family: More than three times a week    Attends Religious Services: More than 4 times per year    Active Member of Clubs or Organizations: No    Attends Banker Meetings: Never    Marital Status: Never married  Intimate Partner Violence: Not At Risk (08/04/2023)   Humiliation, Afraid, Rape, and Kick questionnaire    Fear of Current or Ex-Partner: No    Emotionally Abused: No    Physically Abused: No    Sexually Abused: No    Review of Systems: See HPI, otherwise negative ROS  Physical Exam: Vital signs in last 24 hours: Temp:  [98.6 F (37 C)] 98.6 F (37 C) (08/11 0659) Pulse Rate:  [72] 72 (08/11 0659) Resp:  [21] 21 (08/11 0659) BP: (178)/(91) 178/91 (08/11 0659) SpO2:  [100 %] 100 % (08/11 0659) Weight:  [885 kg] 114 kg (08/11 0642)   General:   Alert,  Well-developed, well-nourished, pleasant and cooperative in NAD Head:  Normocephalic and atraumatic. Eyes:  Sclera clear, no icterus.   Conjunctiva pink. Ears:  Normal auditory  acuity. Nose:  No deformity, discharge,  or lesions. Msk:  Symmetrical without gross deformities. Normal posture. Extremities:  Without clubbing or edema. Neurologic:  Alert and  oriented x4;  grossly normal neurologically. Skin:  Intact without significant lesions or rashes. Psych:  Alert and cooperative. Normal mood and affect.  Impression/Plan: Autumn [name] is here for a colonoscopy to be performed for rectal bleeding  The risks of the procedure including infection, bleed, or perforation as well as benefits, limitations, alternatives and imponderables have been reviewed with the patient. Questions have been answered. All parties agreeable.

## 2023-09-06 NOTE — Anesthesia Postprocedure Evaluation (Signed)
 Anesthesia Post Note  Patient: Autumn [name]  Procedure(s) Performed: COLONOSCOPY  Patient location during evaluation: Phase II Anesthesia Type: General Level of consciousness: awake and alert Pain management: pain level controlled Vital Signs Assessment: post-procedure vital signs reviewed and stable Respiratory status: spontaneous breathing, nonlabored ventilation and respiratory function stable Cardiovascular status: blood pressure returned to baseline and stable Postop Assessment: no apparent nausea or vomiting Anesthetic complications: no   There were no known notable events for this encounter.   Last Vitals:  Vitals:   09/06/23 0659 09/06/23 0821  BP: (!) 178/91 128/68  Pulse: 72 84  Resp: (!) 21 18  Temp: 37 C 36.5 C  SpO2: 100% 100%    Last Pain:  Vitals:   09/06/23 0821  TempSrc: Oral  PainSc: 0-No pain                 Krew Hortman L Patrick Salemi

## 2023-09-07 ENCOUNTER — Encounter (HOSPITAL_COMMUNITY): Payer: Self-pay | Admitting: Internal Medicine

## 2023-09-07 LAB — SURGICAL PATHOLOGY

## 2023-09-12 ENCOUNTER — Other Ambulatory Visit: Payer: Self-pay | Admitting: Family Medicine

## 2023-09-28 ENCOUNTER — Ambulatory Visit (INDEPENDENT_AMBULATORY_CARE_PROVIDER_SITE_OTHER): Admitting: Gastroenterology

## 2023-09-28 VITALS — BP 116/78 | HR 79 | Temp 98.6°F | Ht 66.0 in | Wt 254.4 lb

## 2023-09-28 DIAGNOSIS — K641 Second degree hemorrhoids: Secondary | ICD-10-CM | POA: Diagnosis not present

## 2023-09-28 DIAGNOSIS — D509 Iron deficiency anemia, unspecified: Secondary | ICD-10-CM

## 2023-09-28 NOTE — Progress Notes (Signed)
     [facility] BANDING PROCEDURE NOTE  Autumn [name] is a 58 y.o. female presenting today for consideration of hemorrhoid banding. Last colonoscopy Aug 2025: internal hemorrhoids, two 4-6 mm polyps s/p removal, tubular adenoma. Her main report was significant bleeding. Hgb 10 Aug 6, down from 10.9 from 8/4, previously 11.6 earlier this year. Baseline Hgb low mid 11 range. Bleeding has resolved. No straining or constipation. She would like to pursue banding.    The patient presents with symptomatic grade 2 hemorrhoids, unresponsive to maximal medical therapy, requesting rubber band ligation of her hemorrhoidal disease. All risks, benefits, and alternative forms of therapy were described and informed consent was obtained.  The decision was made to band the left lateral internal hemorrhoid using latex-free bands, and the [facility] System was used to perform band ligation without complication. Digital anorectal examination was then performed to assure proper positioning of the band, and to adjust the banded tissue as required. The patient was discharged home without pain or other issues. Dietary and behavioral recommendations were given, along with follow-up instructions. The patient will return in several weeks for followup and possible additional banding as required. I am rechecking CBC and adding anemia panel.   No complications were encountered and the patient tolerated the procedure well.   Therisa MICAEL Stager, PhD, ANP-BC [facility]

## 2023-09-28 NOTE — Patient Instructions (Signed)
  Please avoid straining.  You should limit your toilet time to 2-3 minutes at the most.   I recommend Benefiber 2 teaspoons each morning in the beverage of your choice!  Please call me with any concerns or issues!  I have ordered labs to further check your blood counts and anemia.   I will see you in follow-up for additional banding in several weeks.   I enjoyed seeing you again today! I value our relationship and want to provide genuine, compassionate, and quality care. You may receive a survey regarding your visit with me, and I welcome your feedback! Thanks so much for taking the time to complete this. I look forward to seeing you again.      Therisa MICAEL Stager, PhD, ANP-BC [facility]

## 2023-09-29 ENCOUNTER — Ambulatory Visit (INDEPENDENT_AMBULATORY_CARE_PROVIDER_SITE_OTHER): Admitting: Family Medicine

## 2023-09-29 ENCOUNTER — Encounter: Payer: Self-pay | Admitting: Family Medicine

## 2023-09-29 VITALS — BP 114/77 | HR 90 | Resp 16 | Ht 64.0 in | Wt 253.8 lb

## 2023-09-29 DIAGNOSIS — I10 Essential (primary) hypertension: Secondary | ICD-10-CM

## 2023-09-29 DIAGNOSIS — Z7984 Long term (current) use of oral hypoglycemic drugs: Secondary | ICD-10-CM | POA: Diagnosis not present

## 2023-09-29 DIAGNOSIS — E119 Type 2 diabetes mellitus without complications: Secondary | ICD-10-CM | POA: Diagnosis not present

## 2023-09-29 LAB — CBC WITH DIFFERENTIAL/PLATELET
Basophils Absolute: 0.1 x10E3/uL (ref 0.0–0.2)
Basos: 1 %
EOS (ABSOLUTE): 0.2 x10E3/uL (ref 0.0–0.4)
Eos: 3 %
Hematocrit: 34.5 % (ref 34.0–46.6)
Hemoglobin: 10.1 g/dL — ABNORMAL LOW (ref 11.1–15.9)
Immature Grans (Abs): 0.1 x10E3/uL (ref 0.0–0.1)
Immature Granulocytes: 1 %
Lymphocytes Absolute: 3.2 x10E3/uL — ABNORMAL HIGH (ref 0.7–3.1)
Lymphs: 36 %
MCH: 22.1 pg — ABNORMAL LOW (ref 26.6–33.0)
MCHC: 29.3 g/dL — ABNORMAL LOW (ref 31.5–35.7)
MCV: 76 fL — ABNORMAL LOW (ref 79–97)
Monocytes Absolute: 1 x10E3/uL — ABNORMAL HIGH (ref 0.1–0.9)
Monocytes: 11 %
Neutrophils Absolute: 4.3 x10E3/uL (ref 1.4–7.0)
Neutrophils: 48 %
Platelets: 464 x10E3/uL — ABNORMAL HIGH (ref 150–450)
RBC: 4.57 x10E6/uL (ref 3.77–5.28)
RDW: 15.5 % — ABNORMAL HIGH (ref 11.7–15.4)
WBC: 8.9 x10E3/uL (ref 3.4–10.8)

## 2023-09-29 LAB — FOLATE: Folate: 8.9 ng/mL (ref >3.0)

## 2023-09-29 LAB — IRON AND TIBC
Iron Saturation: 18 % (ref 15–55)
Iron: 70 ug/dL (ref 27–159)
Total Iron Binding Capacity: 394 ug/dL (ref 250–450)
UIBC: 324 ug/dL (ref 131–425)

## 2023-09-29 LAB — FERRITIN: Ferritin: 22 ng/mL (ref 15–150)

## 2023-09-29 LAB — VITAMIN B12: Vitamin B-12: 317 pg/mL (ref 232–1245)

## 2023-09-29 NOTE — Patient Instructions (Signed)

## 2023-09-29 NOTE — Progress Notes (Signed)
 Established Patient Office Visit   Subjective  Patient ID: Autumn [name], female    DOB: 1966-01-02  Age: 58 y.o. MRN: [id number]  Chief Complaint  Patient presents with   Fatigue    [place] after her colonoscopy she was told she was anemic and wants to discuss what to take for supplementation     She  has a past medical history of Anxiety, Arthritis, Complication of anesthesia, Diabetes mellitus without complication (HCC), Fibroids, submucosal (12/02/2016), GERD (gastroesophageal reflux disease), H/O colonoscopy, Hypertension, Rectal bleeding, Shortness of breath dyspnea, and Thickened endometrium (12/02/2016).  HPI Patient presents to the clinic for chronic follow up. For the details of today's visit, please refer to assessment and plan.   Review of Systems  Constitutional:  Negative for chills and fever.  Eyes:  Negative for blurred vision.  Respiratory:  Negative for shortness of breath.   Cardiovascular:  Negative for chest pain.  Neurological:  Negative for headaches.      Objective:     BP 114/77   Pulse 90   Resp 16   Ht 5' 4 (1.626 m)   Wt 253 lb 12.8 oz (115.1 kg)   LMP 11/11/2016   SpO2 94%   BMI 43.56 kg/m  BP Readings from Last 3 Encounters:  09/29/23 114/77  09/28/23 116/78  09/06/23 128/68      Physical Exam Vitals reviewed.  Constitutional:      General: She is not in acute distress.    Appearance: Normal appearance. She is not ill-appearing, toxic-appearing or diaphoretic.  HENT:     Head: Normocephalic.  Eyes:     General:        Right eye: No discharge.        Left eye: No discharge.     Conjunctiva/sclera: Conjunctivae normal.  Cardiovascular:     Rate and Rhythm: Normal rate.     Pulses: Normal pulses.     Heart sounds: Normal heart sounds.  Pulmonary:     Effort: Pulmonary effort is normal. No respiratory distress.     Breath sounds: Normal breath sounds.  Abdominal:     General: Bowel sounds are normal.     Palpations:  Abdomen is soft.     Tenderness: There is no abdominal tenderness. There is no right CVA tenderness, left CVA tenderness or guarding.  Skin:    General: Skin is warm and dry.  Neurological:     Mental Status: She is alert.     Coordination: Coordination normal.     Gait: Gait normal.  Psychiatric:        Mood and Affect: Mood normal.        Behavior: Behavior normal.      No results found for any visits on 09/29/23.  The 10-year ASCVD risk score (Arnett DK, et al., 2019) is: 15.1%    Assessment & Plan:  Primary hypertension Assessment & Plan: Blood pressure controlled in today's visit Patient reported on Labetalol  200 mg twice daily  Labs ordered. Continued discussion on DASH diet, low sodium diet and maintain a exercise routine for 150 minutes per week.   Orders: -     Lipid panel -     BMP8+eGFR  Type 2 diabetes mellitus without complication, without long-term current use of insulin (HCC) Assessment & Plan: Last Hemoglobin A1c: 7.5 Labs: Ordered today, results pending; will follow up accordingly. The patient reports adhering to prescribed medications: Metformin  1000 mg twice daily  Reviewed non-pharmacological interventions, including a balanced diet  rich in lean proteins, healthy fats, whole grains, and high-fiber vegetables. Emphasized reducing refined sugars and processed carbohydrates, and incorporating more fruits, leafy greens, and legumes. Education: Patient was educated on recognizing signs and symptoms of both hypoglycemia and hyperglycemia, and advised to seek emergency care if these symptoms occur. Follow-Up: Scheduled for follow-up in 3-4 months, or sooner if needed. Patient Understanding: The patient verbalized understanding of the care plan, and all questions were answered. Additional Care: Ophthalmology exam current. Foot exam results were within normal limits.   Orders: -     Hemoglobin A1c -     Microalbumin / creatinine urine ratio    Return in  about 4 months (around 01/29/2024), or if symptoms worsen or fail to improve, for chronic follow-up.   Hilario Kidd Wilhelmena Falter, FNP

## 2023-09-29 NOTE — Assessment & Plan Note (Signed)
 Last Hemoglobin A1c: 7.5 Labs: Ordered today, results pending; will follow up accordingly. The patient reports adhering to prescribed medications: Metformin  1000 mg twice daily  Reviewed non-pharmacological interventions, including a balanced diet rich in lean proteins, healthy fats, whole grains, and high-fiber vegetables. Emphasized reducing refined sugars and processed carbohydrates, and incorporating more fruits, leafy greens, and legumes. Education: Patient was educated on recognizing signs and symptoms of both hypoglycemia and hyperglycemia, and advised to seek emergency care if these symptoms occur. Follow-Up: Scheduled for follow-up in 3-4 months, or sooner if needed. Patient Understanding: The patient verbalized understanding of the care plan, and all questions were answered. Additional Care: Ophthalmology exam current. Foot exam results were within normal limits.

## 2023-09-29 NOTE — Assessment & Plan Note (Signed)
 Blood pressure controlled in today's visit Patient reported on Labetalol  200 mg twice daily  Labs ordered. Continued discussion on DASH diet, low sodium diet and maintain a exercise routine for 150 minutes per week.

## 2023-09-30 ENCOUNTER — Other Ambulatory Visit: Payer: Self-pay | Admitting: Family Medicine

## 2023-09-30 DIAGNOSIS — Z13220 Encounter for screening for lipoid disorders: Secondary | ICD-10-CM

## 2023-10-04 DIAGNOSIS — I10 Essential (primary) hypertension: Secondary | ICD-10-CM | POA: Diagnosis not present

## 2023-10-04 DIAGNOSIS — E119 Type 2 diabetes mellitus without complications: Secondary | ICD-10-CM | POA: Diagnosis not present

## 2023-10-06 ENCOUNTER — Ambulatory Visit: Payer: Self-pay | Admitting: Family Medicine

## 2023-10-06 ENCOUNTER — Ambulatory Visit: Payer: Self-pay | Admitting: Internal Medicine

## 2023-10-06 LAB — BMP8+EGFR
BUN/Creatinine Ratio: 13 (ref 9–23)
BUN: 12 mg/dL (ref 6–24)
CO2: 21 mmol/L (ref 20–29)
Calcium: 9.7 mg/dL (ref 8.7–10.2)
Chloride: 105 mmol/L (ref 96–106)
Creatinine, Ser: 0.95 mg/dL (ref 0.57–1.00)
Glucose: 142 mg/dL — ABNORMAL HIGH (ref 70–99)
Potassium: 4.7 mmol/L (ref 3.5–5.2)
Sodium: 140 mmol/L (ref 134–144)
eGFR: 71 mL/min/1.73 (ref >59)

## 2023-10-06 LAB — MICROALBUMIN / CREATININE URINE RATIO
Creatinine, Urine: 186.7 mg/dL
Microalb/Creat Ratio: <2 mg/g{creat} (ref 0–29)
Microalbumin, Urine: <3 ug/mL

## 2023-10-06 LAB — LIPID PANEL
Chol/HDL Ratio: 3.3 ratio (ref 0.0–4.4)
Cholesterol, Total: 166 mg/dL (ref 100–199)
HDL: 50 mg/dL (ref >39)
LDL Chol Calc (NIH): 90 mg/dL (ref 0–99)
Triglycerides: 149 mg/dL (ref 0–149)
VLDL Cholesterol Cal: 26 mg/dL (ref 5–40)

## 2023-10-06 LAB — HEMOGLOBIN A1C
Est. average glucose Bld gHb Est-mCnc: 137 mg/dL
Hgb A1c MFr Bld: 6.4 % — ABNORMAL HIGH (ref 4.8–5.6)

## 2023-10-07 ENCOUNTER — Ambulatory Visit: Admitting: Gastroenterology

## 2023-10-19 ENCOUNTER — Ambulatory Visit: Payer: Self-pay | Admitting: Gastroenterology

## 2023-10-20 ENCOUNTER — Other Ambulatory Visit: Payer: Self-pay | Admitting: Family Medicine

## 2023-10-22 ENCOUNTER — Other Ambulatory Visit: Payer: Self-pay | Admitting: Family Medicine

## 2023-10-23 ENCOUNTER — Other Ambulatory Visit: Payer: Self-pay | Admitting: Family Medicine

## 2023-11-16 ENCOUNTER — Telehealth: Payer: Self-pay | Admitting: *Deleted

## 2023-11-16 ENCOUNTER — Ambulatory Visit (INDEPENDENT_AMBULATORY_CARE_PROVIDER_SITE_OTHER): Admitting: Gastroenterology

## 2023-11-16 VITALS — BP 126/84 | HR 82 | Temp 97.2°F | Ht 64.0 in | Wt 253.8 lb

## 2023-11-16 DIAGNOSIS — E041 Nontoxic single thyroid nodule: Secondary | ICD-10-CM | POA: Insufficient documentation

## 2023-11-16 DIAGNOSIS — R195 Other fecal abnormalities: Secondary | ICD-10-CM

## 2023-11-16 DIAGNOSIS — R1310 Dysphagia, unspecified: Secondary | ICD-10-CM | POA: Diagnosis not present

## 2023-11-16 DIAGNOSIS — D508 Other iron deficiency anemias: Secondary | ICD-10-CM

## 2023-11-16 NOTE — Progress Notes (Signed)
 Gastroenterology Office Note     Primary Care Physician:  Terry Wilhelmena Lloyd Hilario, FNP  Primary Gastroenterologist: Dr Cindie    Chief Complaint   Chief Complaint  Patient presents with   Follow-up    Follow up on banding, pt not sure she is here for banding or not     History of Present Illness   Autumn [name] is a 58 y.o. female presenting today with a history of constipation, GERD, dysphagia, rectal bleeding due to internal hemorrhoids, microcytic anemia with IDA in setting of significant rectal bleeding, undergoing colonoscopy Aug 2025 with adenomas. She has had left lateral banding with latex free bands in Sept 2025. Returning for possible additional banding.   Last colonoscopy Aug 2025: internal hemorrhoids, two 4-6 mm polyps s/p removal, tubular adenoma. Her main report was significant bleeding. Hgb 10 Aug 6, down from 10.9 from 8/4, previously 11.6 earlier this year. Baseline Hgb low mid 11 range   Due to anemia, CBC was checked along with anemia panel. Ferritin low at 22, Hgb 10 stable. IDA noted with new onset anemia noted started in Aug 2025. Suspect due to significant rectal bleeding in light of hemorrhoids.   No oral iron. No melena. No hematochezia. Feels tired. Has lower extremity cramps in back of legs when standing leg. No craving ice. Lower abdominal near groin bilaterally after sitting a long time ago and then resolved after about 2 hours. Denies any constipation. Will have postprandial urgency at times, ranges from soft to watery. Will run thru her if cereal. If non-greasy foods, will be solid. Milk causes looser stool. Gallbladder remains.   Feels like she has difficulty getting pills down when swallowing. States she feels an area in right side of throat that is causing pressure. Pills got hung in mid throat in cervical area. Notes solid food dysphagia as well. Has to drink lots to get food down.    No menstrual cycle.   US  thyroid  2022: 1.4 cm  left mid thyroid  nodule with recs for follow-up in 1 year, right neck palpable abnormality correlated with mildly enlarged submandibular lymph node favored to be reactive.   CT soft tissue neck with contrast 2022: prominent tonsils, recommended ENT and saw Dr Karis.    EGD 2022: normal esophagus, gastritis s/p biopsy and negative for H.pylori, normal examined duodenum.   Past Medical History:  Diagnosis Date   Anxiety    Panic attacks- cries   Arthritis    Complication of anesthesia    had some hallucations   Diabetes mellitus without complication (HCC)    Fibroids, submucosal 12/02/2016   GERD (gastroesophageal reflux disease)    H/O colonoscopy    Hypertension    Rectal bleeding    in setting of hemorrhoids. Colonoscopy in July 2020   Shortness of breath dyspnea    with exertion   Thickened endometrium 12/02/2016    Past Surgical History:  Procedure Laterality Date   BALLOON DILATION N/A 10/03/2020   Procedure: BALLOON DILATION;  Surgeon: Cindie Carlin POUR, DO;  [facility]: [facility];  Service: Endoscopy;  Laterality: N/A;   BIOPSY  10/03/2020   Procedure: BIOPSY;  Surgeon: Cindie Carlin POUR, DO;  [facility]: [facility];  Service: Endoscopy;;   BREAST LUMPECTOMY Right    age 76's-benign   CESAREAN SECTION     x3   COLONOSCOPY     COLONOSCOPY N/A 09/06/2023   Procedure: COLONOSCOPY;  Surgeon: Cindie Carlin POUR, DO;  Location: [facility];  Service: Endoscopy;  Laterality: N/A;  11:45 AM, ASA 3, pt knows to arrive at 6:30   COLONOSCOPY WITH PROPOFOL  N/A 08/23/2018   PROPOFOL ;  Surgeon: Harvey Margo CROME, MD; three 2-4 mm polyps in the rectum and sigmoid colon, external and internal hemorrhoids, tortuous left colon.  Pathology with 3 hyperplastic polyps.  Next colonoscopy in 2030.   DENTAL SURGERY     ESOPHAGOGASTRODUODENOSCOPY (EGD) WITH PROPOFOL  N/A 10/03/2020   Surgeon: Cindie Carlin POUR, DO; Normal esophagus, Gastritis biopsied (nonspecific reactive gastropathy,  negative for H. pylori), normal examined duodenum.   MASS EXCISION N/A 12/06/2017   Procedure: EXCISION 1CM CYST ON FACE;  Surgeon: Kallie Manuelita BROCKS, MD;  Location: [facility];  Service: General;  Laterality: N/A;   MULTIPLE EXTRACTIONS WITH ALVEOLOPLASTY Bilateral 04/12/2015   Procedure: BILATERAL MULTIPLE EXTRACTIONS NUMBERS TWENTY, TWENTY ONE, TWENTY TWO, TWENTY THREE, TWENTY FOUR, TWENTY FIVE, TWENTY EIGHT, TWENTY NINE WITH ALVEOLOPLASTY;  Surgeon: Glendia Primrose, DDS;  Location: [facility];  Service: Oral Surgery;  Laterality: Bilateral;   POLYPECTOMY  08/23/2018   Procedure: POLYPECTOMY;  Surgeon: Harvey Margo CROME, MD;  [facility];  Service: Endoscopy;;   TUBAL LIGATION      Current Outpatient Medications  Medication Sig Dispense Refill   ACCU-CHEK GUIDE test strip USE TO CHECK BLOOD SUGAR UP TO TWICE DAILY     Accu-Chek Softclix Lancets lancets Use as instructed 100 each 0   albuterol  (VENTOLIN  HFA) 108 (90 Base) MCG/ACT inhaler Inhale 2 puffs into the lungs every 6 (six) hours as needed for wheezing or shortness of breath. 8 g 2   Blood Glucose Monitoring Suppl (ACCU-CHEK GUIDE ME) w/Device KIT Use once daily 1 kit 1   busPIRone  (BUSPAR ) 15 MG tablet Take 1 tablet (15 mg total) by mouth 2 (two) times daily as needed. 60 tablet 2   Cholecalciferol (VITAMIN D3) 125 MCG (5000 UT) CAPS Take 1 capsule (5,000 Units total) by mouth daily. 90 capsule 1   doxepin  (SINEQUAN ) 25 MG capsule Take 1 capsule (25 mg total) by mouth at bedtime. 30 capsule 2   escitalopram  (LEXAPRO ) 10 MG tablet Take 1 tablet (10 mg total) by mouth daily. 30 tablet 3   labetalol  (NORMODYNE ) 200 MG tablet Take 1 tablet (200 mg total) by mouth 2 (two) times daily. 90 tablet 3   metFORMIN  (GLUCOPHAGE ) 1000 MG tablet Take 1 tablet (1,000 mg total) by mouth 2 (two) times daily with a meal. 180 tablet 3   Omega 3 1000 MG CAPS Take 2 capsules (2,000 mg total) by mouth daily. 180 capsule 2   omega-3 acid ethyl esters  (LOVAZA) 1 g capsule Take 2 capsules (2,000 mg total) by mouth daily. 180 capsule 2   omeprazole  (PRILOSEC) 20 MG capsule TAKE ONE CAPSULE BY MOUTH 2 TIMES A DAY BEFORE A MEAL 180 capsule 2   pravastatin  (PRAVACHOL ) 80 MG tablet TAKE ONE TABLET BY MOUTH EVERY DAY 90 tablet 3   No current facility-administered medications for this visit.    Allergies as of 11/16/2023 - Review Complete 11/16/2023  Allergen Reaction Noted   Bee venom Anaphylaxis 11/04/2010   Tramadol Hives 11/04/2010   Asa [aspirin] Other (See Comments) 08/14/2015   Latex Itching 12/26/2018   Peppermint flavoring agent (non-screening) Other (See Comments) 03/01/2018    Family History  Adopted: Yes  Problem Relation Age of Onset   Diabetes Daughter    Heart disease Other    Colon cancer Neg Hx  Social History   Socioeconomic History   Marital status: Significant Other    Spouse name: Not on file   Number of children: Not on file   Years of education: Not on file   Highest education level: Not on file  Occupational History   Not on file  Tobacco Use   Smoking status: Every Day    Current packs/day: 1.00    Average packs/day: 1 pack/day for 31.0 years (31.0 ttl pk-yrs)    Types: Cigarettes   Smokeless tobacco: Never  Vaping Use   Vaping status: Never Used  Substance and Sexual Activity   Alcohol use: Yes    Comment: occ   Drug use: No   Sexual activity: Yes    Birth control/protection: Surgical    Comment: tubal  Other Topics Concern   Not on file  Social History Narrative   Not on file   Social Drivers of Health   Financial Resource Strain: Low Risk  (08/04/2023)   Overall Financial Resource Strain (CARDIA)    Difficulty of Paying Living Expenses: Not hard at all  Food Insecurity: No Food Insecurity (08/04/2023)   Hunger Vital Sign    Worried About Running Out of Food in the Last Year: Never true    Ran Out of Food in the Last Year: Never true  Transportation Needs: No Transportation Needs  (08/04/2023)   PRAPARE - Administrator, Civil Service (Medical): No    Lack of Transportation (Non-Medical): No  Physical Activity: Sufficiently Active (08/04/2023)   Exercise Vital Sign    Days of Exercise per Week: 7 days    Minutes of Exercise per Session: 30 min  Stress: No Stress Concern Present (08/04/2023)   [employer] of Occupational Health - Occupational Stress Questionnaire    Feeling of Stress: Not at all  Social Connections: Moderately Isolated (08/04/2023)   Social Connection and Isolation Panel    Frequency of Communication with Friends and Family: More than three times a week    Frequency of Social Gatherings with Friends and Family: More than three times a week    Attends Religious Services: More than 4 times per year    Active Member of [employer] or Organizations: No    Attends Banker Meetings: Never    Marital Status: Never married  Intimate Partner Violence: Not At Risk (08/04/2023)   Humiliation, Afraid, Rape, and Kick questionnaire    Fear of Current or Ex-Partner: No    Emotionally Abused: No    Physically Abused: No    Sexually Abused: No     Review of Systems   Gen: Denies any fever, chills, fatigue, weight loss, lack of appetite.  CV: Denies chest pain, heart palpitations, peripheral edema, syncope.  Resp: Denies shortness of breath at rest or with exertion. Denies wheezing or cough.  GI: see HPI GU : Denies urinary burning, urinary frequency, urinary hesitancy MS: Denies joint pain, muscle weakness, cramps, or limitation of movement.  Derm: Denies rash, itching, dry skin Psych: Denies depression, anxiety, memory loss, and confusion Heme: Denies bruising, bleeding, and enlarged lymph nodes.   Physical Exam   BP 126/84   Pulse 82   Temp (!) 97.2 F (36.2 C)   Ht 5' 4 (1.626 m)   Wt 253 lb 12.8 oz (115.1 kg)   LMP 11/11/2016   BMI 43.56 kg/m  General:   Alert and oriented. Pleasant and cooperative. Well-nourished and  well-developed.  Head:  Normocephalic and atraumatic. Eyes:  Without icterus Neck: possible enlarged right submandibular lymph node, difficult to assess thyroid   Abdomen:  +BS, soft, non-tender and non-distended.  Rectal:  Deferred  Msk:  Symmetrical without gross deformities. Normal posture. Extremities:  Without edema. Neurologic:  Alert and  oriented x4;  grossly normal neurologically. Skin:  Intact without significant lesions or rashes. Psych:  Alert and cooperative. Normal mood and affect.   Assessment   Autumn [name] is a 58 y.o. female presenting today with a history of constipation, GERD, dysphagia, rectal bleeding due to internal hemorrhoids, microcytic anemia with IDA in setting of significant rectal bleeding recently, returning for follow-up.  Rectal bleeding now resolved s/p hemorrhoid banding. Colonoscopy on file Aug 2025 as noted above. Will hold off on banding today. Suspect she had new onset anemia in light of significant lower GI bleeding that has now resolved. However, with drifting Hgb and low iron, will update this today and check celiac labs.   Dysphagia: pill and solid food dysphagia, pointing to cervical area. Known hx of larger tonsils and saw ENT in 2022 with thyroid  US  showing 1.4 cm left mid thyroid  nodule with recs for follow-up in 1 year, right neck palpable abnormality correlated with mildly enlarged submandibular lymph node favored to be reactive. She was lost to follow-up. Unclear if dysphagia is r/t GI or thyroid  etiology at this point and will order thyroid  US  along with EGD/dilation. EGD is also appropriate if she is found to have persistent IDA despite resolution of rectal bleeding.   Postprandial stool: intermittent soft/watery, appears significantly r/t diet and obvious link with lactose. Avoid lactose, can discuss further management at next appt. Celiac serologies to be checked. No concern for IBD at this point. Colonoscopy recently on file.     PLAN    Recheck CBC, iron studies, check celiac panel Proceed with upper endoscopy/dilation by Dr. Cindie in near future: the risks, benefits, and alternatives have been discussed with the patient in detail. The patient states understanding and desires to proceed.  Thyroid  US  3 month follow-up    Therisa MICAEL Stager, PhD, ANP-BC [facility]

## 2023-11-16 NOTE — H&P (View-Only) (Signed)
 Gastroenterology Office Note     Primary Care Physician:  Terry Wilhelmena Lloyd Hilario, FNP  Primary Gastroenterologist: Dr Cindie    Chief Complaint   Chief Complaint  Patient presents with   Follow-up    Follow up on banding, pt not sure she is here for banding or not     History of Present Illness   Autumn [name] is a 58 y.o. female presenting today with a history of constipation, GERD, dysphagia, rectal bleeding due to internal hemorrhoids, microcytic anemia with IDA in setting of significant rectal bleeding, undergoing colonoscopy Aug 2025 with adenomas. She has had left lateral banding with latex free bands in Sept 2025. Returning for possible additional banding.   Last colonoscopy Aug 2025: internal hemorrhoids, two 4-6 mm polyps s/p removal, tubular adenoma. Her main report was significant bleeding. Hgb 10 Aug 6, down from 10.9 from 8/4, previously 11.6 earlier this year. Baseline Hgb low mid 11 range   Due to anemia, CBC was checked along with anemia panel. Ferritin low at 22, Hgb 10 stable. IDA noted with new onset anemia noted started in Aug 2025. Suspect due to significant rectal bleeding in light of hemorrhoids.   No oral iron. No melena. No hematochezia. Feels tired. Has lower extremity cramps in back of legs when standing leg. No craving ice. Lower abdominal near groin bilaterally after sitting a long time ago and then resolved after about 2 hours. Denies any constipation. Will have postprandial urgency at times, ranges from soft to watery. Will run thru her if cereal. If non-greasy foods, will be solid. Milk causes looser stool. Gallbladder remains.   Feels like she has difficulty getting pills down when swallowing. States she feels an area in right side of throat that is causing pressure. Pills got hung in mid throat in cervical area. Notes solid food dysphagia as well. Has to drink lots to get food down.    No menstrual cycle.   US  thyroid  2022: 1.4 cm  left mid thyroid  nodule with recs for follow-up in 1 year, right neck palpable abnormality correlated with mildly enlarged submandibular lymph node favored to be reactive.   CT soft tissue neck with contrast 2022: prominent tonsils, recommended ENT and saw Dr Karis.    EGD 2022: normal esophagus, gastritis s/p biopsy and negative for H.pylori, normal examined duodenum.   Past Medical History:  Diagnosis Date   Anxiety    Panic attacks- cries   Arthritis    Complication of anesthesia    had some hallucations   Diabetes mellitus without complication (HCC)    Fibroids, submucosal 12/02/2016   GERD (gastroesophageal reflux disease)    H/O colonoscopy    Hypertension    Rectal bleeding    in setting of hemorrhoids. Colonoscopy in July 2020   Shortness of breath dyspnea    with exertion   Thickened endometrium 12/02/2016    Past Surgical History:  Procedure Laterality Date   BALLOON DILATION N/A 10/03/2020   Procedure: BALLOON DILATION;  Surgeon: Cindie Carlin POUR, DO;  [facility];  Service: Endoscopy;  Laterality: N/A;   BIOPSY  10/03/2020   Procedure: BIOPSY;  Surgeon: Cindie Carlin POUR, DO;  [facility]: [facility];  Service: Endoscopy;;   BREAST LUMPECTOMY Right    age 76's-benign   CESAREAN SECTION     x3   COLONOSCOPY     COLONOSCOPY N/A 09/06/2023   Procedure: COLONOSCOPY;  Surgeon: Cindie Carlin POUR, DO;  [facility]: [facility];  Service: Endoscopy;  Laterality: N/A;  11:45 AM, ASA 3, pt knows to arrive at 6:30   COLONOSCOPY WITH PROPOFOL  N/A 08/23/2018   PROPOFOL ;  Surgeon: Harvey Margo CROME, MD; three 2-4 mm polyps in the rectum and sigmoid colon, external and internal hemorrhoids, tortuous left colon.  Pathology with 3 hyperplastic polyps.  Next colonoscopy in 2030.   DENTAL SURGERY     ESOPHAGOGASTRODUODENOSCOPY (EGD) WITH PROPOFOL  N/A 10/03/2020   Surgeon: Cindie Carlin POUR, DO; Normal esophagus, Gastritis biopsied (nonspecific reactive gastropathy,  negative for H. pylori), normal examined duodenum.   MASS EXCISION N/A 12/06/2017   Procedure: EXCISION 1CM CYST ON FACE;  Surgeon: Kallie Manuelita BROCKS, MD;  Location: [facility];  Service: General;  Laterality: N/A;   MULTIPLE EXTRACTIONS WITH ALVEOLOPLASTY Bilateral 04/12/2015   Procedure: BILATERAL MULTIPLE EXTRACTIONS NUMBERS TWENTY, TWENTY ONE, TWENTY TWO, TWENTY THREE, TWENTY FOUR, TWENTY FIVE, TWENTY EIGHT, TWENTY NINE WITH ALVEOLOPLASTY;  Surgeon: Glendia Primrose, DDS;  Location: [facility];  Service: Oral Surgery;  Laterality: Bilateral;   POLYPECTOMY  08/23/2018   Procedure: POLYPECTOMY;  Surgeon: Harvey Margo CROME, MD;  [facility];  Service: Endoscopy;;   TUBAL LIGATION      Current Outpatient Medications  Medication Sig Dispense Refill   ACCU-CHEK GUIDE test strip USE TO CHECK BLOOD SUGAR UP TO TWICE DAILY     Accu-Chek Softclix Lancets lancets Use as instructed 100 each 0   albuterol  (VENTOLIN  HFA) 108 (90 Base) MCG/ACT inhaler Inhale 2 puffs into the lungs every 6 (six) hours as needed for wheezing or shortness of breath. 8 g 2   Blood Glucose Monitoring Suppl (ACCU-CHEK GUIDE ME) w/Device KIT Use once daily 1 kit 1   busPIRone  (BUSPAR ) 15 MG tablet Take 1 tablet (15 mg total) by mouth 2 (two) times daily as needed. 60 tablet 2   Cholecalciferol (VITAMIN D3) 125 MCG (5000 UT) CAPS Take 1 capsule (5,000 Units total) by mouth daily. 90 capsule 1   doxepin  (SINEQUAN ) 25 MG capsule Take 1 capsule (25 mg total) by mouth at bedtime. 30 capsule 2   escitalopram  (LEXAPRO ) 10 MG tablet Take 1 tablet (10 mg total) by mouth daily. 30 tablet 3   labetalol  (NORMODYNE ) 200 MG tablet Take 1 tablet (200 mg total) by mouth 2 (two) times daily. 90 tablet 3   metFORMIN  (GLUCOPHAGE ) 1000 MG tablet Take 1 tablet (1,000 mg total) by mouth 2 (two) times daily with a meal. 180 tablet 3   Omega 3 1000 MG CAPS Take 2 capsules (2,000 mg total) by mouth daily. 180 capsule 2   omega-3 acid ethyl esters  (LOVAZA) 1 g capsule Take 2 capsules (2,000 mg total) by mouth daily. 180 capsule 2   omeprazole  (PRILOSEC) 20 MG capsule TAKE ONE CAPSULE BY MOUTH 2 TIMES A DAY BEFORE A MEAL 180 capsule 2   pravastatin  (PRAVACHOL ) 80 MG tablet TAKE ONE TABLET BY MOUTH EVERY DAY 90 tablet 3   No current facility-administered medications for this visit.    Allergies as of 11/16/2023 - Review Complete 11/16/2023  Allergen Reaction Noted   Bee venom Anaphylaxis 11/04/2010   Tramadol Hives 11/04/2010   Asa [aspirin] Other (See Comments) 08/14/2015   Latex Itching 12/26/2018   Peppermint flavoring agent (non-screening) Other (See Comments) 03/01/2018    Family History  Adopted: Yes  Problem Relation Age of Onset   Diabetes Daughter    Heart disease Other    Colon cancer Neg Hx  Social History   Socioeconomic History   Marital status: Significant Other    Spouse name: Not on file   Number of children: Not on file   Years of education: Not on file   Highest education level: Not on file  Occupational History   Not on file  Tobacco Use   Smoking status: Every Day    Current packs/day: 1.00    Average packs/day: 1 pack/day for 31.0 years (31.0 ttl pk-yrs)    Types: Cigarettes   Smokeless tobacco: Never  Vaping Use   Vaping status: Never Used  Substance and Sexual Activity   Alcohol use: Yes    Comment: occ   Drug use: No   Sexual activity: Yes    Birth control/protection: Surgical    Comment: tubal  Other Topics Concern   Not on file  Social History Narrative   Not on file   Social Drivers of Health   Financial Resource Strain: Low Risk  (08/04/2023)   Overall Financial Resource Strain (CARDIA)    Difficulty of Paying Living Expenses: Not hard at all  Food Insecurity: No Food Insecurity (08/04/2023)   Hunger Vital Sign    Worried About Running Out of Food in the Last Year: Never true    Ran Out of Food in the Last Year: Never true  Transportation Needs: No Transportation Needs  (08/04/2023)   PRAPARE - Administrator, Civil Service (Medical): No    Lack of Transportation (Non-Medical): No  Physical Activity: Sufficiently Active (08/04/2023)   Exercise Vital Sign    Days of Exercise per Week: 7 days    Minutes of Exercise per Session: 30 min  Stress: No Stress Concern Present (08/04/2023)   [employer] of Occupational Health - Occupational Stress Questionnaire    Feeling of Stress: Not at all  Social Connections: Moderately Isolated (08/04/2023)   Social Connection and Isolation Panel    Frequency of Communication with Friends and Family: More than three times a week    Frequency of Social Gatherings with Friends and Family: More than three times a week    Attends Religious Services: More than 4 times per year    Active Member of [employer] or Organizations: No    Attends Banker Meetings: Never    Marital Status: Never married  Intimate Partner Violence: Not At Risk (08/04/2023)   Humiliation, Afraid, Rape, and Kick questionnaire    Fear of Current or Ex-Partner: No    Emotionally Abused: No    Physically Abused: No    Sexually Abused: No     Review of Systems   Gen: Denies any fever, chills, fatigue, weight loss, lack of appetite.  CV: Denies chest pain, heart palpitations, peripheral edema, syncope.  Resp: Denies shortness of breath at rest or with exertion. Denies wheezing or cough.  GI: see HPI GU : Denies urinary burning, urinary frequency, urinary hesitancy MS: Denies joint pain, muscle weakness, cramps, or limitation of movement.  Derm: Denies rash, itching, dry skin Psych: Denies depression, anxiety, memory loss, and confusion Heme: Denies bruising, bleeding, and enlarged lymph nodes.   Physical Exam   BP 126/84   Pulse 82   Temp (!) 97.2 F (36.2 C)   Ht 5' 4 (1.626 m)   Wt 253 lb 12.8 oz (115.1 kg)   LMP 11/11/2016   BMI 43.56 kg/m  General:   Alert and oriented. Pleasant and cooperative. Well-nourished and  well-developed.  Head:  Normocephalic and atraumatic. Eyes:  Without icterus Neck: possible enlarged right submandibular lymph node, difficult to assess thyroid   Abdomen:  +BS, soft, non-tender and non-distended.  Rectal:  Deferred  Msk:  Symmetrical without gross deformities. Normal posture. Extremities:  Without edema. Neurologic:  Alert and  oriented x4;  grossly normal neurologically. Skin:  Intact without significant lesions or rashes. Psych:  Alert and cooperative. Normal mood and affect.   Assessment   RHEAGAN NAYAK is a 58 y.o. female presenting today with a history of constipation, GERD, dysphagia, rectal bleeding due to internal hemorrhoids, microcytic anemia with IDA in setting of significant rectal bleeding recently, returning for follow-up.  Rectal bleeding now resolved s/p hemorrhoid banding. Colonoscopy on file Aug 2025 as noted above. Will hold off on banding today. Suspect she had new onset anemia in light of significant lower GI bleeding that has now resolved. However, with drifting Hgb and low iron, will update this today and check celiac labs.   Dysphagia: pill and solid food dysphagia, pointing to cervical area. Known hx of larger tonsils and saw ENT in 2022 with thyroid  US  showing 1.4 cm left mid thyroid  nodule with recs for follow-up in 1 year, right neck palpable abnormality correlated with mildly enlarged submandibular lymph node favored to be reactive. She was lost to follow-up. Unclear if dysphagia is r/t GI or thyroid  etiology at this point and will order thyroid  US  along with EGD/dilation. EGD is also appropriate if she is found to have persistent IDA despite resolution of rectal bleeding.   Postprandial stool: intermittent soft/watery, appears significantly r/t diet and obvious link with lactose. Avoid lactose, can discuss further management at next appt. Celiac serologies to be checked. No concern for IBD at this point. Colonoscopy recently on file.     PLAN    Recheck CBC, iron studies, check celiac panel Proceed with upper endoscopy/dilation by Dr. Cindie in near future: the risks, benefits, and alternatives have been discussed with the patient in detail. The patient states understanding and desires to proceed.  Thyroid  US  3 month follow-up    Autumn MICAEL Stager, PhD, ANP-BC [facility]

## 2023-11-16 NOTE — Telephone Encounter (Signed)
 Called pt. Aware US  thyroid  scheduled for 10/27, arrival 1:15pm EGD/ED scheduled for 11/30/23. Aware will send to her mychart and link sent for her to reset her password.

## 2023-11-16 NOTE — Patient Instructions (Signed)
 I have ordered a thyroid  ultrasound and an upper endoscopy with dilation by Dr Cindie in the near future!  Please have blood work done at [facility].  Further recommendations to follow, and I will see you in 3 months regardless!  Happy early [age]!  I enjoyed seeing you again today! I value our relationship and want to provide genuine, compassionate, and quality care. You may receive a survey regarding your visit with me, and I welcome your feedback! Thanks so much for taking the time to complete this. I look forward to seeing you again.      Therisa MICAEL Stager, PhD, ANP-BC [facility]

## 2023-11-21 LAB — CBC WITH DIFFERENTIAL/PLATELET
Basophils Absolute: 0.1 x10E3/uL (ref 0.0–0.2)
Basos: 1 %
EOS (ABSOLUTE): 0.2 x10E3/uL (ref 0.0–0.4)
Eos: 3 %
Hematocrit: 34.5 % (ref 34.0–46.6)
Hemoglobin: 10 g/dL — ABNORMAL LOW (ref 11.1–15.9)
Immature Grans (Abs): 0 x10E3/uL (ref 0.0–0.1)
Immature Granulocytes: 0 %
Lymphocytes Absolute: 3.2 x10E3/uL — ABNORMAL HIGH (ref 0.7–3.1)
Lymphs: 39 %
MCH: 21.2 pg — ABNORMAL LOW (ref 26.6–33.0)
MCHC: 29 g/dL — ABNORMAL LOW (ref 31.5–35.7)
MCV: 73 fL — ABNORMAL LOW (ref 79–97)
Monocytes Absolute: 0.8 x10E3/uL (ref 0.1–0.9)
Monocytes: 10 %
Neutrophils Absolute: 3.8 x10E3/uL (ref 1.4–7.0)
Neutrophils: 47 %
Platelets: 493 x10E3/uL — ABNORMAL HIGH (ref 150–450)
RBC: 4.72 x10E6/uL (ref 3.77–5.28)
RDW: 14.8 % (ref 11.7–15.4)
WBC: 8.2 x10E3/uL (ref 3.4–10.8)

## 2023-11-21 LAB — CELIAC DISEASE PANEL
Endomysial IgA: NEGATIVE
IgA/Immunoglobulin A, Serum: 401 mg/dL — ABNORMAL HIGH (ref 87–352)
Transglutaminase IgA: <2 U/mL (ref 0–3)

## 2023-11-21 LAB — IRON,TIBC AND FERRITIN PANEL
Ferritin: 15 ng/mL (ref 15–150)
Iron Saturation: 4 % — CL (ref 15–55)
Iron: 22 ug/dL — ABNORMAL LOW (ref 27–159)
Total Iron Binding Capacity: 492 ug/dL — ABNORMAL HIGH (ref 250–450)
UIBC: 470 ug/dL — ABNORMAL HIGH (ref 131–425)

## 2023-11-22 ENCOUNTER — Ambulatory Visit (HOSPITAL_COMMUNITY)
Admission: RE | Admit: 2023-11-22 | Discharge: 2023-11-22 | Disposition: A | Discharge status: Home / Self Care | Source: Ambulatory Visit | Attending: Gastroenterology | Admitting: Gastroenterology

## 2023-11-22 DIAGNOSIS — E041 Nontoxic single thyroid nodule: Secondary | ICD-10-CM | POA: Diagnosis not present

## 2023-11-22 DIAGNOSIS — E042 Nontoxic multinodular goiter: Secondary | ICD-10-CM | POA: Diagnosis not present

## 2023-11-29 ENCOUNTER — Other Ambulatory Visit: Payer: Self-pay

## 2023-11-29 MED ORDER — METFORMIN HCL 1000 MG PO TABS: 1000.0000 mg | ORAL_TABLET | Freq: Two times a day (BID) | ORAL | 3 refills | Status: AC

## 2023-11-30 ENCOUNTER — Encounter (HOSPITAL_COMMUNITY): Payer: Self-pay | Admitting: Internal Medicine

## 2023-11-30 ENCOUNTER — Ambulatory Visit (HOSPITAL_COMMUNITY): Admitting: Anesthesiology

## 2023-11-30 ENCOUNTER — Other Ambulatory Visit: Payer: Self-pay

## 2023-11-30 ENCOUNTER — Encounter (HOSPITAL_COMMUNITY)
Admission: RE | Disposition: A | Discharge status: Home / Self Care | Payer: Self-pay | Source: Home / Self Care | Attending: Internal Medicine

## 2023-11-30 ENCOUNTER — Ambulatory Visit (HOSPITAL_COMMUNITY)
Admission: RE | Admit: 2023-11-30 | Discharge: 2023-11-30 | Disposition: A | Discharge status: Home / Self Care | Attending: Internal Medicine | Admitting: Internal Medicine

## 2023-11-30 DIAGNOSIS — K2970 Gastritis, unspecified, without bleeding: Secondary | ICD-10-CM | POA: Diagnosis not present

## 2023-11-30 DIAGNOSIS — K219 Gastro-esophageal reflux disease without esophagitis: Secondary | ICD-10-CM | POA: Diagnosis not present

## 2023-11-30 DIAGNOSIS — F17210 Nicotine dependence, cigarettes, uncomplicated: Secondary | ICD-10-CM | POA: Diagnosis not present

## 2023-11-30 DIAGNOSIS — K648 Other hemorrhoids: Secondary | ICD-10-CM | POA: Diagnosis not present

## 2023-11-30 DIAGNOSIS — Z6841 Body Mass Index (BMI) 40.0 and over, adult: Secondary | ICD-10-CM | POA: Diagnosis not present

## 2023-11-30 DIAGNOSIS — Z7984 Long term (current) use of oral hypoglycemic drugs: Secondary | ICD-10-CM | POA: Diagnosis not present

## 2023-11-30 DIAGNOSIS — I10 Essential (primary) hypertension: Secondary | ICD-10-CM | POA: Diagnosis not present

## 2023-11-30 DIAGNOSIS — K2971 Gastritis, unspecified, with bleeding: Secondary | ICD-10-CM

## 2023-11-30 DIAGNOSIS — E66813 Obesity, class 3: Secondary | ICD-10-CM | POA: Diagnosis not present

## 2023-11-30 DIAGNOSIS — R12 Heartburn: Secondary | ICD-10-CM | POA: Diagnosis present

## 2023-11-30 DIAGNOSIS — E119 Type 2 diabetes mellitus without complications: Secondary | ICD-10-CM | POA: Insufficient documentation

## 2023-11-30 DIAGNOSIS — R1310 Dysphagia, unspecified: Secondary | ICD-10-CM | POA: Diagnosis present

## 2023-11-30 HISTORY — PX: ESOPHAGEAL DILATION: SHX303

## 2023-11-30 HISTORY — PX: ESOPHAGOGASTRODUODENOSCOPY: SHX5428

## 2023-11-30 LAB — GLUCOSE, CAPILLARY: Glucose-Capillary: 98 mg/dL (ref 70–99)

## 2023-11-30 SURGERY — EGD (ESOPHAGOGASTRODUODENOSCOPY)
Anesthesia: General

## 2023-11-30 MED ORDER — LACTATED RINGERS IV SOLN: INTRAVENOUS | Status: DC

## 2023-11-30 MED ORDER — LIDOCAINE 2% (20 MG/ML) 5 ML SYRINGE
INTRAMUSCULAR | Status: DC | PRN
  Administered 2023-11-30 (×2): 100 mg via INTRAVENOUS

## 2023-11-30 MED ORDER — PROPOFOL 10 MG/ML IV BOLUS
INTRAVENOUS | Status: DC | PRN
  Administered 2023-11-30 (×2): 50 mg via INTRAVENOUS
  Administered 2023-11-30: 100 mg via INTRAVENOUS

## 2023-11-30 NOTE — Op Note (Signed)
 [facility] Patient Name: Autumn [name] Procedure Date: 11/30/2023 10:56 AM MRN: [id number] Date of Birth: [birth year] Attending MD: Carlin POUR. Cindie , [place], [id number] CSN: [id number] Age: 58 Admit Type: Outpatient Procedure:                Upper GI endoscopy Indications:              Dysphagia, Heartburn Providers:                Carlin POUR. Cindie, DO, Emilee Tubb RN, RN, Mickel FREDRIK Shirlean Alexandro, Technician Referring MD:              Medicines:                See the Anesthesia note for documentation of the                            administered medications Complications:            No immediate complications. Estimated Blood Loss:     Estimated blood loss was minimal. Procedure:                Pre-Anesthesia Assessment:                           - The anesthesia plan was to use monitored                            anesthesia care (MAC).                           After obtaining informed consent, the endoscope was                            passed under direct vision. Throughout the                            procedure, the patient's blood pressure, pulse, and                            oxygen saturations were monitored continuously. The                            [id number] ([id number]) scope was introduced through                            the mouth, and advanced to the second part of                            duodenum. The upper GI endoscopy was accomplished                            without difficulty. The patient tolerated the                            procedure well. Scope  In: 11:15:49 AM Scope Out: 11:22:42 AM Total Procedure Duration: 0 hours 6 minutes 53 seconds  Findings:      The Z-line was regular and was found 39 cm from the incisors.      No endoscopic abnormality was evident in the esophagus to explain the       patient's complaint of dysphagia. Preparations were made for empiric       dilation. A TTS dilator was passed through  the scope. Dilation with an       18-19-20 mm balloon dilator was performed to 20 mm. Dilation was       performed with a mild resistance at 20 mm. Estimated blood loss was none.      Biopsies were taken with a cold forceps in the middle third of the       esophagus for histology.      Inflammation was found in the gastric antrum, striped appearance       terminating at the pylorus. Biopsies were taken with a cold forceps for       Helicobacter pylori testing.      The duodenal bulb, first portion of the duodenum and second portion of       the duodenum were normal. Impression:               - Z-line regular, 39 cm from the incisors.                           - Gastritis. Biopsied.                           - Normal duodenal bulb, first portion of the                            duodenum and second portion of the duodenum.                           - Biopsies were taken with a cold forceps for                            histology in the middle third of the esophagus. Moderate Sedation:      Per Anesthesia Care Recommendation:           - Patient has a contact number available for                            emergencies. The signs and symptoms of potential                            delayed complications were discussed with the                            patient. Return to normal activities tomorrow.                            Written discharge instructions were provided to the                            patient.                           -  Resume previous diet.                           - Continue present medications.                           - Await pathology results.                           - Repeat upper endoscopy PRN for retreatment.                           - Return to GI clinic in 3 months.                           - Use Prilosec (omeprazole ) 20 mg PO BID. Procedure Code(s):        --- Professional ---                           [phone], Esophagogastroduodenoscopy, flexible,                             transoral; with biopsy, single or multiple Diagnosis Code(s):        --- Professional ---                           K29.70, Gastritis, unspecified, without bleeding                           R13.10, Dysphagia, unspecified                           R12, Heartburn CPT copyright 2022 [employer]. All rights reserved. The codes documented in this report are preliminary and upon coder review may  be revised to meet current compliance requirements. Carlin POUR. Cindie, DO Carlin POUR. Cindie, DO 11/30/2023 11:32:38 AM This report has been signed electronically. Number of Addenda: 0

## 2023-11-30 NOTE — Anesthesia Preprocedure Evaluation (Signed)
 Anesthesia Evaluation  Patient identified by MRN, date of birth, ID band Patient awake    Reviewed: Allergy & Precautions, H&P , NPO status , Patient's Chart, lab work & pertinent test results, reviewed documented beta blocker date and time   Airway Mallampati: II  TM Distance: >3 FB Neck ROM: full    Dental  (+) Edentulous Upper, Edentulous Lower   Pulmonary shortness of breath and with exertion, Current Smoker and Patient abstained from smoking.   Pulmonary exam normal breath sounds clear to auscultation       Cardiovascular Exercise Tolerance: Good hypertension, Normal cardiovascular exam Rhythm:regular Rate:Normal     Neuro/Psych  PSYCHIATRIC DISORDERS Anxiety Depression    negative neurological ROS     GI/Hepatic Neg liver ROS,GERD  ,,Rectal bleeding   Endo/Other  diabetes, Type 2  Class 3 obesity  Renal/GU negative Renal ROS  negative genitourinary   Musculoskeletal  (+) Arthritis , Osteoarthritis,    Abdominal   Peds  Hematology  (+) Blood dyscrasia, anemia Hgb 10   Anesthesia Other Findings   Reproductive/Obstetrics negative OB ROS                              Anesthesia Physical Anesthesia Plan  ASA: 3  Anesthesia Plan: General   Post-op Pain Management: Minimal or no pain anticipated   Induction: Intravenous  PONV Risk Score and Plan: Propofol  infusion  Airway Management Planned: Nasal Cannula and Natural Airway  Additional Equipment: None  Intra-op Plan:   Post-operative Plan:   Informed Consent: I have reviewed the patients History and Physical, chart, labs and discussed the procedure including the risks, benefits and alternatives for the proposed anesthesia with the patient or authorized representative who has indicated his/her understanding and acceptance.     Dental Advisory Given  Plan Discussed with: CRNA  Anesthesia Plan Comments:           Anesthesia Quick Evaluation

## 2023-11-30 NOTE — Transfer of Care (Signed)
 Immediate Anesthesia Transfer of Care Note  Patient: Autumn [name]  Procedure(s) Performed: EGD (ESOPHAGOGASTRODUODENOSCOPY) DILATION, ESOPHAGUS  Patient Location: Endoscopy Unit  Anesthesia Type:General  Level of Consciousness: awake, alert , oriented, and patient cooperative  Airway & Oxygen Therapy: Patient Spontanous Breathing  Post-op Assessment: Report given to RN, Post -op Vital signs reviewed and stable, and Patient moving all extremities X 4  Post vital signs: Reviewed and stable  Last Vitals:  Vitals Value Taken Time  BP 137/108 1132  Temp 98 1132  Pulse 95 1132  Resp 22 1132  SpO2 100 1132    Last Pain:  Vitals:   11/30/23 1110  TempSrc:   PainSc: 7       Patients Stated Pain Goal: 10 (11/30/23 1044)  Complications: No notable events documented.

## 2023-11-30 NOTE — Anesthesia Postprocedure Evaluation (Signed)
 Anesthesia Post Note  Patient: Autumn [name]  Procedure(s) Performed: EGD (ESOPHAGOGASTRODUODENOSCOPY) DILATION, ESOPHAGUS  Patient location during evaluation: Endoscopy Anesthesia Type: General Level of consciousness: awake and alert Pain management: pain level controlled Vital Signs Assessment: post-procedure vital signs reviewed and stable Respiratory status: spontaneous breathing, nonlabored ventilation and respiratory function stable Cardiovascular status: stable Anesthetic complications: no   There were no known notable events for this encounter.   Last Vitals:  Vitals:   11/30/23 1130 11/30/23 1137  BP: (!) 85/32 97/64  Pulse: (!) 101   Resp: (!) 31   Temp: 36.8 C   SpO2: 96%     Last Pain:  Vitals:   11/30/23 1130  TempSrc: Oral  PainSc: 0-No pain                 Khizar Fiorella L Harpreet Signore

## 2023-11-30 NOTE — Interval H&P Note (Signed)
 History and Physical Interval Note:  11/30/2023 10:58 AM  Autumn [name]  has presented today for surgery, with the diagnosis of dysphagia.  The various methods of treatment have been discussed with the patient and family. After consideration of risks, benefits and other options for treatment, the patient has consented to  Procedure(s) with comments: EGD (ESOPHAGOGASTRODUODENOSCOPY) (N/A) - 1:00pm DILATION, ESOPHAGUS (N/A) as a surgical intervention.  The patient's history has been reviewed, patient examined, no change in status, stable for surgery.  I have reviewed the patient's chart and labs.  Questions were answered to the patient's satisfaction.     Carlin MARLA Hasty

## 2023-11-30 NOTE — Discharge Instructions (Addendum)
 EGD Discharge instructions Please read the instructions outlined below and refer to this sheet in the next few weeks. These discharge instructions provide you with general information on caring for yourself after you leave the hospital. Your doctor may also give you specific instructions. While your treatment has been planned according to the most current medical practices available, unavoidable complications occasionally occur. If you have any problems or questions after discharge, please call your doctor. ACTIVITY You may resume your regular activity but move at a slower pace for the next 24 hours.  Take frequent rest periods for the next 24 hours.  Walking will help expel (get rid of) the air and reduce the bloated feeling in your abdomen.  No driving for 24 hours (because of the anesthesia (medicine) used during the test).  You may shower.  Do not sign any important legal documents or operate any machinery for 24 hours (because of the anesthesia used during the test).  NUTRITION Drink plenty of fluids.  You may resume your normal diet.  Begin with a light meal and progress to your normal diet.  Avoid alcoholic beverages for 24 hours or as instructed by your caregiver.  MEDICATIONS You may resume your normal medications unless your caregiver tells you otherwise.  WHAT YOU CAN EXPECT TODAY You may experience abdominal discomfort such as a feeling of fullness or "gas" pains.  FOLLOW-UP Your doctor will discuss the results of your test with you.  SEEK IMMEDIATE MEDICAL ATTENTION IF ANY OF THE FOLLOWING OCCUR: Excessive nausea (feeling sick to your stomach) and/or vomiting.  Severe abdominal pain and distention (swelling).  Trouble swallowing.  Temperature over 101 F (37.8 C).  Rectal bleeding or vomiting of blood.   Your EGD revealed mild amount inflammation in your stomach.  I took biopsies of this to rule out infection with a bacteria called H. pylori.  I also took samples of your  esophagus.  Small bowel appeared normal.  Your esophagus was mildly tight.  I did stretch it out today.  Hopefully this helps with your swallowing.  Await pathology results, my office will contact you.  Continue on omeprazole  twice daily.  Follow-up with GI in 2 to 3 months. Message sent to office  I hope you have a great rest of your week!  Carlin POUR. Cindie, D.O. Gastroenterology and Hepatology [facility]

## 2023-12-01 ENCOUNTER — Encounter (HOSPITAL_COMMUNITY): Payer: Self-pay | Admitting: Internal Medicine

## 2023-12-01 LAB — SURGICAL PATHOLOGY

## 2023-12-07 ENCOUNTER — Ambulatory Visit: Payer: Self-pay | Admitting: Gastroenterology

## 2023-12-07 ENCOUNTER — Other Ambulatory Visit: Payer: Self-pay | Admitting: *Deleted

## 2023-12-07 DIAGNOSIS — E041 Nontoxic single thyroid nodule: Secondary | ICD-10-CM

## 2023-12-09 ENCOUNTER — Other Ambulatory Visit: Payer: Self-pay | Admitting: Family Medicine

## 2023-12-09 DIAGNOSIS — I10 Essential (primary) hypertension: Secondary | ICD-10-CM

## 2023-12-19 ENCOUNTER — Other Ambulatory Visit: Payer: Self-pay | Admitting: Family Medicine

## 2023-12-22 NOTE — Progress Notes (Signed)
Referral sent, they will contact patient.

## 2024-01-02 NOTE — Progress Notes (Unsigned)
 [facility]   INITIAL CONSULT NOTE  Patient Care Team: Del Wilhelmena Falter, Hilario, FNP as PCP - General (Family Medicine) Cindie Carlin POUR, DO as Consulting Physician (Internal Medicine) Marilynn Nest, DO as Consulting Physician (Obstetrics and Gynecology) Valri Lamarr LABOR, NP as Nurse Practitioner (Pulmonary Disease) Rudy Josette GORMAN DEVONNA as Physician Assistant (Gastroenterology) Onesimo Oneil LABOR, MD as Consulting Physician (Orthopedic Surgery) Melvenia Motto, MD as Attending Physician (Emergency Medicine)  Hematological/Oncological History 11/16/2023: Iron 22 (L), iron saturation 4% (L), ferritin 15 (L), TIBC 492 (H), Hgb 10.0 (L), MCV 73 (L), Plt 493 (H).  01/03/2024: Establish care with [facility]  CHIEF COMPLAINTS/PURPOSE OF CONSULTATION:  Iron deficiency anemia   HISTORY OF PRESENTING ILLNESS:  Autumn [name] 58 y.o. female with medical history significant for hypertension, diabetes, GERD, hemorrhoidal bleeding presents to the hematology clinic for evaluation for iron deficiency anemia.   On exam today, Autumn [name] reports that she has evidence of iron deficiency anemia in the past due to hemorrhoidal bleeding.  She recently underwent hemorrhoidal banding with improvement of symptoms.  She is currently not on any iron replacements including iron pills.  She reports having ongoing fatigue which does impact her ADLs.  She denies any appetite changes or dietary restrictions.  She denies nausea, vomiting or bowel habit changes.  She does crave ice throughout the day.  She has some shortness of breath with exertion but none at rest.  She denies fevers, chills, night sweats, chest pain, cough, headaches or dizziness.  She has no other complaints.  Rest of the 10 point ROS as below.  MEDICAL HISTORY:  Past Medical History:  Diagnosis Date   Anxiety    Panic attacks- cries   Arthritis    Complication of anesthesia    had some hallucations    Diabetes mellitus without complication (HCC)    Fibroids, submucosal 12/02/2016   GERD (gastroesophageal reflux disease)    H/O colonoscopy    Hypertension    Rectal bleeding    in setting of hemorrhoids. Colonoscopy in July 2020   Shortness of breath dyspnea    with exertion   Thickened endometrium 12/02/2016    SURGICAL HISTORY: Past Surgical History:  Procedure Laterality Date   BALLOON DILATION N/A 10/03/2020   Procedure: BALLOON DILATION;  Surgeon: Cindie Carlin POUR, DO;  [facility];  Service: Endoscopy;  Laterality: N/A;   BIOPSY  10/03/2020   Procedure: BIOPSY;  Surgeon: Cindie Carlin POUR, DO;  [facility];  Service: Endoscopy;;   BREAST LUMPECTOMY Right    age 56's-benign   CESAREAN SECTION     x3   COLONOSCOPY     COLONOSCOPY N/A 09/06/2023   Procedure: COLONOSCOPY;  Surgeon: Cindie Carlin POUR, DO;  [facility];  Service: Endoscopy;  Laterality: N/A;  11:45 AM, ASA 3, pt knows to arrive at 6:30   COLONOSCOPY WITH PROPOFOL  N/A 08/23/2018   PROPOFOL ;  Surgeon: Harvey Margo LITTIE, MD; three 2-4 mm polyps in the rectum and sigmoid colon, external and internal hemorrhoids, tortuous left colon.  Pathology with 3 hyperplastic polyps.  Next colonoscopy in 2030.   DENTAL SURGERY     ESOPHAGEAL DILATION N/A 11/30/2023   Procedure: DILATION, ESOPHAGUS;  Surgeon: Cindie Carlin POUR, DO;  [facility];  Service: Endoscopy;  Laterality: N/A;   ESOPHAGOGASTRODUODENOSCOPY N/A 11/30/2023   Procedure: EGD (ESOPHAGOGASTRODUODENOSCOPY);  Surgeon: Cindie Carlin POUR, DO;  [facility];  Service: Endoscopy;  Laterality: N/A;  1:00pm   ESOPHAGOGASTRODUODENOSCOPY (EGD) WITH PROPOFOL  N/A 10/03/2020   Surgeon: Cindie Carlin POUR, DO; Normal esophagus, Gastritis biopsied (nonspecific reactive gastropathy, negative for H. pylori), normal examined duodenum.   MASS EXCISION N/A 12/06/2017   Procedure: EXCISION 1CM CYST ON FACE;  Surgeon: Kallie Manuelita BROCKS, MD;  Location: [facility];  Service: General;  Laterality: N/A;   MULTIPLE EXTRACTIONS WITH ALVEOLOPLASTY Bilateral 04/12/2015   Procedure: BILATERAL MULTIPLE EXTRACTIONS NUMBERS TWENTY, TWENTY ONE, TWENTY TWO, TWENTY THREE, TWENTY FOUR, TWENTY FIVE, TWENTY EIGHT, TWENTY NINE WITH ALVEOLOPLASTY;  Surgeon: Glendia Primrose, DDS;  Location: [facility];  Service: Oral Surgery;  Laterality: Bilateral;   POLYPECTOMY  08/23/2018   Procedure: POLYPECTOMY;  Surgeon: Harvey Margo CROME, MD;  [facility];  Service: Endoscopy;;   TUBAL LIGATION      SOCIAL HISTORY: Social History   Socioeconomic History   Marital status: Significant Other    Spouse name: Not on file   Number of children: Not on file   Years of education: Not on file   Highest education level: Not on file  Occupational History   Not on file  Tobacco Use   Smoking status: Every Day    Current packs/day: 1.00    Average packs/day: 1 pack/day for 31.0 years (31.0 ttl pk-yrs)    Types: Cigarettes   Smokeless tobacco: Never  Vaping Use   Vaping status: Never Used  Substance and Sexual Activity   Alcohol use: Not Currently   Drug use: No   Sexual activity: Yes    Birth control/protection: Surgical    Comment: tubal  Other Topics Concern   Not on file  Social History Narrative   Not on file   Social Drivers of Health   Financial Resource Strain: Low Risk  (08/04/2023)   Overall Financial Resource Strain (CARDIA)    Difficulty of Paying Living Expenses: Not hard at all  Food Insecurity: No Food Insecurity (01/03/2024)   Hunger Vital Sign    Worried About Running Out of Food in the Last Year: Never true    Ran Out of Food in the Last Year: Never true  Transportation Needs: No Transportation Needs (01/03/2024)   PRAPARE - Administrator, Civil Service (Medical): No    Lack of Transportation (Non-Medical): No  Physical Activity: Sufficiently Active (08/04/2023)   Exercise Vital Sign    Days of Exercise per  Week: 7 days    Minutes of Exercise per Session: 30 min  Stress: No Stress Concern Present (08/04/2023)   [employer] of Occupational Health - Occupational Stress Questionnaire    Feeling of Stress: Not at all  Social Connections: Moderately Isolated (08/04/2023)   Social Connection and Isolation Panel    Frequency of Communication with Friends and Family: More than three times a week    Frequency of Social Gatherings with Friends and Family: More than three times a week    Attends Religious Services: More than 4 times per year    Active Member of [employer] or Organizations: No    Attends Banker Meetings: Never    Marital Status: Never married  Intimate Partner Violence: Not At Risk (01/03/2024)   Humiliation, Afraid, Rape, and Kick questionnaire    Fear of Current or Ex-Partner: No    Emotionally Abused: No    Physically Abused: No    Sexually Abused: No    FAMILY HISTORY: Family History  Adopted: Yes  Problem Relation Age of Onset   Diabetes Daughter  Heart disease Other    Colon cancer Neg Hx     ALLERGIES:  is allergic to bee venom, tramadol, asa [aspirin], latex, and peppermint flavoring agent (non-screening).  MEDICATIONS:  Current Outpatient Medications  Medication Sig Dispense Refill   ACCU-CHEK GUIDE TEST test strip TEST DAILY 100 strip 1   Accu-Chek Softclix Lancets lancets Use as instructed 100 each 0   albuterol  (VENTOLIN  HFA) 108 (90 Base) MCG/ACT inhaler Inhale 2 puffs into the lungs every 6 (six) hours as needed for wheezing or shortness of breath. 8 g 2   Blood Glucose Monitoring Suppl (ACCU-CHEK GUIDE ME) w/Device KIT Use once daily 1 kit 1   busPIRone  (BUSPAR ) 15 MG tablet Take 1 tablet (15 mg total) by mouth 2 (two) times daily as needed. 60 tablet 2   Cholecalciferol (VITAMIN D3) 125 MCG (5000 UT) CAPS Take 1 capsule (5,000 Units total) by mouth daily. 90 capsule 1   doxepin  (SINEQUAN ) 25 MG capsule Take 1 capsule (25 mg total) by mouth at  bedtime. 30 capsule 2   escitalopram  (LEXAPRO ) 10 MG tablet Take 1 tablet (10 mg total) by mouth daily. 30 tablet 3   labetalol  (NORMODYNE ) 200 MG tablet Take 1 tablet (200 mg total) by mouth 2 (two) times daily. 90 tablet 3   metFORMIN  (GLUCOPHAGE ) 1000 MG tablet Take 1 tablet (1,000 mg total) by mouth 2 (two) times daily with a meal. 180 tablet 3   Omega 3 1000 MG CAPS Take 2 capsules (2,000 mg total) by mouth daily. 180 capsule 2   omega-3 acid ethyl esters (LOVAZA) 1 g capsule Take 2 capsules (2,000 mg total) by mouth daily. 180 capsule 2   omeprazole  (PRILOSEC) 20 MG capsule TAKE ONE CAPSULE BY MOUTH 2 TIMES A DAY BEFORE A MEAL 180 capsule 2   pravastatin  (PRAVACHOL ) 80 MG tablet TAKE ONE TABLET BY MOUTH EVERY DAY 90 tablet 3   No current facility-administered medications for this visit.    REVIEW OF SYSTEMS:   Constitutional: ( - ) fevers, ( - )  chills , ( - ) night sweats Eyes: ( - ) blurriness of vision, ( - ) double vision, ( - ) watery eyes Ears, nose, mouth, throat, and face: ( - ) mucositis, ( - ) sore throat Respiratory: ( - ) cough, ( + ) dyspnea, ( - ) wheezes Cardiovascular: ( - ) palpitation, ( - ) chest discomfort, ( - ) lower extremity swelling Gastrointestinal:  ( - ) nausea, ( - ) heartburn, ( - ) change in bowel habits Skin: ( - ) abnormal skin rashes Lymphatics: ( - ) new lymphadenopathy, ( - ) easy bruising Neurological: ( - ) numbness, ( - ) tingling, ( - ) new weaknesses Behavioral/Psych: ( - ) mood change, ( - ) new changes  All other systems were reviewed with the patient and are negative.  PHYSICAL EXAMINATION: ECOG PERFORMANCE STATUS: 1 - Symptomatic but completely ambulatory  Vitals:   01/03/24 1014  BP: 129/69  Pulse: 88  Resp: 18  Temp: 97.8 F (36.6 C)  SpO2: 100%   Filed Weights   01/03/24 1014  Weight: 254 lb (115.2 kg)    GENERAL: well appearing female in NAD  SKIN: skin color, texture, turgor are normal, no rashes or significant  lesions EYES: conjunctiva are pink and non-injected, sclera clear LUNGS: clear to auscultation and percussion with normal breathing effort HEART: regular rate & rhythm and no murmurs and no lower extremity edema Musculoskeletal: no cyanosis of  digits and no clubbing  PSYCH: alert & oriented x 3, fluent speech NEURO: no focal motor/sensory deficits  LABORATORY DATA:  I have reviewed the data as listed    Latest Ref Rng & Units 11/16/2023   11:11 AM 09/28/2023   12:21 PM 09/01/2023    3:25 PM  CBC  WBC 3.4 - 10.8 x10E3/uL 8.2  8.9  8.7   Hemoglobin 11.1 - 15.9 g/dL 89.9  89.8  89.9   Hematocrit 34.0 - 46.6 % 34.5  34.5  32.3   Platelets 150 - 450 x10E3/uL 493  464  459        Latest Ref Rng & Units 10/04/2023   12:11 PM 09/01/2023    3:25 PM 05/26/2023   11:31 AM  CMP  Glucose 70 - 99 mg/dL 857  886  881   BUN 6 - 24 mg/dL 12  11  12    Creatinine 0.57 - 1.00 mg/dL 9.04  9.17  9.08   Sodium 134 - 144 mmol/L 140  137  139   Potassium 3.5 - 5.2 mmol/L 4.7  4.2  4.8   Chloride 96 - 106 mmol/L 105  104  102   CO2 20 - 29 mmol/L 21  22  20    Calcium 8.7 - 10.2 mg/dL 9.7  9.2  89.7   Total Protein 6.5 - 8.1 g/dL  7.0    Total Bilirubin 0.0 - 1.2 mg/dL  0.7    Alkaline Phos 38 - 126 U/L  74    AST 15 - 41 U/L  25    ALT 0 - 44 U/L  20      RADIOGRAPHIC STUDIES: I have personally reviewed the radiological images as listed and agreed with the findings in the report. No results found.  ASSESSMENT & PLAN Autumn [name] is a 58 y.o. female who presents to the clinic for evaluation of iron deficiency anemia.   # Iron Deficiency Anemia 2/2 to GI bleeding -- Findings are consistent with iron deficiency anemia secondary to hemorrhoidal bleeding. She underwent lateral banding in Sept 2025. EGD from 11/30/2023 showed gastritis with superficial stromal hemorrhage.  --Encouraged her to follow-up with GI to continued management of hemorrhoidal bleeding.  --We will confirm iron deficiency  anemia by ordering iron panel and ferritin as well as reticulocytes, CBC, and CMP --Since patient is symptomatic, we will arrange for IV feraheme 510 mg x 2 doses to bolster iron levels --Plan for return to clinic in 8 weeks to repeat labs after IV iron.    Orders Placed This Encounter  Procedures   CBC with Differential    Standing Status:   Future    Number of Occurrences:   1    Expected Date:   01/03/2024    Expiration Date:   04/02/2024   Comprehensive metabolic panel    Standing Status:   Future    Number of Occurrences:   1    Expected Date:   01/03/2024    Expiration Date:   04/02/2024   Iron and TIBC ([facility] ONLY)    Standing Status:   Future    Number of Occurrences:   1    Expected Date:   01/03/2024    Expiration Date:   04/02/2024   Ferritin    Standing Status:   Future    Number of Occurrences:   1    Expected Date:   01/03/2024    Expiration Date:   04/02/2024   Retic Panel  Standing Status:   Future    Number of Occurrences:   1    Expected Date:   01/03/2024    Expiration Date:   04/02/2024    All questions were answered. The patient knows to call the clinic with any problems, questions or concerns.  I have spent a total of 60 minutes minutes of face-to-face and non-face-to-face time, preparing to see the patient, obtaining and/or reviewing separately obtained history, performing a medically appropriate examination, counseling and educating the patient, ordering medications/tests/procedures, documenting clinical information in the electronic health record, independently interpreting results and communicating results to the patient, and care coordination.   Autumn Police, PA-C Department of Hematology/Oncology [facility] at [facility]

## 2024-01-03 ENCOUNTER — Ambulatory Visit: Payer: Self-pay | Admitting: *Deleted

## 2024-01-03 ENCOUNTER — Encounter: Payer: Self-pay | Admitting: Physician Assistant

## 2024-01-03 ENCOUNTER — Inpatient Hospital Stay: Attending: Physician Assistant | Admitting: Physician Assistant

## 2024-01-03 ENCOUNTER — Inpatient Hospital Stay

## 2024-01-03 VITALS — BP 129/69 | HR 88 | Temp 97.8°F | Resp 18 | Ht 66.0 in | Wt 254.0 lb

## 2024-01-03 DIAGNOSIS — K649 Unspecified hemorrhoids: Secondary | ICD-10-CM | POA: Diagnosis not present

## 2024-01-03 DIAGNOSIS — D508 Other iron deficiency anemias: Secondary | ICD-10-CM

## 2024-01-03 DIAGNOSIS — K2971 Gastritis, unspecified, with bleeding: Secondary | ICD-10-CM | POA: Insufficient documentation

## 2024-01-03 DIAGNOSIS — D500 Iron deficiency anemia secondary to blood loss (chronic): Secondary | ICD-10-CM | POA: Insufficient documentation

## 2024-01-03 DIAGNOSIS — D509 Iron deficiency anemia, unspecified: Secondary | ICD-10-CM | POA: Insufficient documentation

## 2024-01-03 LAB — IRON AND TIBC
Iron: 55 ug/dL (ref 28–170)
Saturation Ratios: 12 % (ref 10.4–31.8)
TIBC: 466 ug/dL — ABNORMAL HIGH (ref 250–450)
UIBC: 411 ug/dL

## 2024-01-03 LAB — RETIC PANEL
Immature Retic Fract: 11.7 % (ref 2.3–15.9)
RBC.: 4.59 MIL/uL (ref 3.87–5.11)
Retic Count, Absolute: 37.6 K/uL (ref 19.0–186.0)
Retic Ct Pct: 0.8 % (ref 0.4–3.1)
Reticulocyte Hemoglobin: 23.2 pg — ABNORMAL LOW (ref >27.9)

## 2024-01-03 LAB — COMPREHENSIVE METABOLIC PANEL WITH GFR
ALT: 22 U/L (ref 0–44)
AST: 31 U/L (ref 15–41)
Albumin: 4.1 g/dL (ref 3.5–5.0)
Alkaline Phosphatase: 93 U/L (ref 38–126)
Anion gap: 9 (ref 5–15)
BUN: 11 mg/dL (ref 6–20)
CO2: 27 mmol/L (ref 22–32)
Calcium: 9.5 mg/dL (ref 8.9–10.3)
Chloride: 106 mmol/L (ref 98–111)
Creatinine, Ser: 0.83 mg/dL (ref 0.44–1.00)
GFR, Estimated: >60 mL/min (ref >60)
Glucose, Bld: 160 mg/dL — ABNORMAL HIGH (ref 70–99)
Potassium: 4.4 mmol/L (ref 3.5–5.1)
Sodium: 141 mmol/L (ref 135–145)
Total Bilirubin: 0.3 mg/dL (ref 0.0–1.2)
Total Protein: 7.5 g/dL (ref 6.5–8.1)

## 2024-01-03 LAB — CBC WITH DIFFERENTIAL/PLATELET
Abs Immature Granulocytes: 0.01 K/uL (ref 0.00–0.07)
Basophils Absolute: 0.1 K/uL (ref 0.0–0.1)
Basophils Relative: 1 %
Eosinophils Absolute: 0.2 K/uL (ref 0.0–0.5)
Eosinophils Relative: 3 %
HCT: 31.7 % — ABNORMAL LOW (ref 36.0–46.0)
Hemoglobin: 9.8 g/dL — ABNORMAL LOW (ref 12.0–15.0)
Immature Granulocytes: 0 %
Lymphocytes Relative: 37 %
Lymphs Abs: 2.9 K/uL (ref 0.7–4.0)
MCH: 21.4 pg — ABNORMAL LOW (ref 26.0–34.0)
MCHC: 30.9 g/dL (ref 30.0–36.0)
MCV: 69.4 fL — ABNORMAL LOW (ref 80.0–100.0)
Monocytes Absolute: 0.7 K/uL (ref 0.1–1.0)
Monocytes Relative: 9 %
Neutro Abs: 4.1 K/uL (ref 1.7–7.7)
Neutrophils Relative %: 50 %
Platelets: 473 K/uL — ABNORMAL HIGH (ref 150–400)
RBC: 4.57 MIL/uL (ref 3.87–5.11)
RDW: 16.2 % — ABNORMAL HIGH (ref 11.5–15.5)
Smear Review: NORMAL
WBC: 8 K/uL (ref 4.0–10.5)
nRBC: 0 % (ref 0.0–0.2)

## 2024-01-03 LAB — FERRITIN: Ferritin: 18 ng/mL (ref 11–307)

## 2024-01-03 NOTE — Progress Notes (Signed)
 Patient aware and verbalized understanding.

## 2024-01-07 ENCOUNTER — Inpatient Hospital Stay

## 2024-01-07 VITALS — BP 120/59 | HR 71 | Temp 97.2°F | Resp 18

## 2024-01-07 DIAGNOSIS — D508 Other iron deficiency anemias: Secondary | ICD-10-CM

## 2024-01-07 DIAGNOSIS — D500 Iron deficiency anemia secondary to blood loss (chronic): Secondary | ICD-10-CM | POA: Diagnosis not present

## 2024-01-07 MED ORDER — DIPHENHYDRAMINE HCL 50 MG/ML IJ SOLN: 50.0000 mg | Freq: Once | INTRAMUSCULAR | Status: DC | PRN

## 2024-01-07 MED ORDER — ALBUTEROL SULFATE HFA 108 (90 BASE) MCG/ACT IN AERS: 2.0000 | INHALATION_SPRAY | Freq: Once | RESPIRATORY_TRACT | Status: DC | PRN

## 2024-01-07 MED ORDER — EPINEPHRINE 0.3 MG/0.3ML IJ SOAJ: 0.3000 mg | Freq: Once | INTRAMUSCULAR | Status: DC | PRN

## 2024-01-07 MED ORDER — HEPARIN SOD (PORK) LOCK FLUSH 100 UNIT/ML IV SOLN: 500.0000 [IU] | Freq: Once | INTRAVENOUS | Status: DC | PRN

## 2024-01-07 MED ORDER — SODIUM CHLORIDE 0.9 % IV SOLN
510.0000 mg | Freq: Once | INTRAVENOUS | Status: AC
  Administered 2024-01-07: 510 mg via INTRAVENOUS
  Filled 2024-01-07: qty 510

## 2024-01-07 MED ORDER — SODIUM CHLORIDE 0.9% FLUSH: 3.0000 mL | Freq: Once | INTRAVENOUS | Status: DC | PRN

## 2024-01-07 MED ORDER — METHYLPREDNISOLONE SODIUM SUCC 125 MG IJ SOLR: 125.0000 mg | Freq: Once | INTRAMUSCULAR | Status: DC | PRN

## 2024-01-07 MED ORDER — SODIUM CHLORIDE 0.9 % IV SOLN: INTRAVENOUS | Status: DC

## 2024-01-07 MED ORDER — SODIUM CHLORIDE 0.9 % IV SOLN: Freq: Once | INTRAVENOUS | Status: DC | PRN

## 2024-01-07 MED ORDER — CETIRIZINE HCL 10 MG PO TABS
10.0000 mg | ORAL_TABLET | Freq: Once | ORAL | Status: AC
  Administered 2024-01-07: 10 mg via ORAL
  Filled 2024-01-07: qty 1

## 2024-01-07 MED ORDER — ACETAMINOPHEN 325 MG PO TABS
650.0000 mg | ORAL_TABLET | Freq: Once | ORAL | Status: AC
  Administered 2024-01-07: 650 mg via ORAL
  Filled 2024-01-07: qty 2

## 2024-01-07 MED ORDER — ALTEPLASE 2 MG IJ SOLR: 2.0000 mg | Freq: Once | INTRAMUSCULAR | Status: DC | PRN

## 2024-01-07 MED ORDER — HEPARIN SOD (PORK) LOCK FLUSH 100 UNIT/ML IV SOLN: 250.0000 [IU] | Freq: Once | INTRAVENOUS | Status: DC | PRN

## 2024-01-07 MED ORDER — FAMOTIDINE IN NACL 20-0.9 MG/50ML-% IV SOLN
20.0000 mg | Freq: Once | INTRAVENOUS | Status: AC
  Administered 2024-01-07: 20 mg via INTRAVENOUS
  Filled 2024-01-07: qty 50

## 2024-01-07 MED ORDER — FAMOTIDINE IN NACL 20-0.9 MG/50ML-% IV SOLN: 20.0000 mg | Freq: Once | INTRAVENOUS | Status: DC | PRN

## 2024-01-07 MED ORDER — SODIUM CHLORIDE 0.9% FLUSH: 10.0000 mL | Freq: Once | INTRAVENOUS | Status: DC | PRN

## 2024-01-07 NOTE — Progress Notes (Signed)
 Patient presents today for Feraheme 1st dose iron infusion. Pre-medications given. Tylenol  650 PO, Pepcid  IV and 10 mg Cetirizine PO. Vital signs stable.   Feraheme given today per MD orders. Tolerated infusion without adverse affects. Vital signs stable. Right ankle swollen. Patient seen by J.Burns NP at the bedside. Patient has no shortness of breath, pain, or itching noted. Patient states she also had swelling in the ankles prior to the iron infusion but not quite as much. Discharged from clinic ambulatory in stable condition. Alert and oriented x 3. F/U with [facility] as scheduled.

## 2024-01-07 NOTE — Patient Instructions (Signed)
 [facility] [place] - A DEPT OF [facility]  Discharge Instructions: Thank you for choosing [facility] to provide your oncology and hematology care.  If you have a lab appointment with the Cancer Center - please note that after April 8th, 2024, all labs will be drawn in the cancer center.  You do not have to check in or register with the main entrance as you have in the past but will complete your check-in in the cancer center.  Wear comfortable clothing and clothing appropriate for easy access to any Portacath or PICC line.   We strive to give you quality time with your provider. You may need to reschedule your appointment if you arrive late (15 or more minutes).  Arriving late affects you and other patients whose appointments are after yours.  Also, if you miss three or more appointments without notifying the office, you may be dismissed from the clinic at the provider's discretion.      For prescription refill requests, have your pharmacy contact our office and allow 72 hours for refills to be completed.    Today you received the following chemotherapy and/or immunotherapy agents Feraheme. Ferumoxytol Injection What is this medication? FERUMOXYTOL (FER ue MOX i tol) treats low levels of iron in your body (iron deficiency anemia). Iron is a mineral that plays an important role in making red blood cells, which carry oxygen from your lungs to the rest of your body. This medicine may be used for other purposes; ask your health care provider or pharmacist if you have questions. COMMON BRAND NAME(S): Feraheme What should I tell my care team before I take this medication? They need to know if you have any of these conditions: Anemia not caused by low iron levels High levels of iron in the blood Magnetic resonance imaging (MRI) test scheduled An unusual or allergic reaction to iron, other medications, foods, dyes, or preservatives Pregnant or trying to get  pregnant Breastfeeding How should I use this medication? This medication is injected into a vein. It is given by your care team in a hospital or clinic setting. Talk to your care team the use of this medication in children. Special care may be needed. Overdosage: If you think you have taken too much of this medicine contact a poison control center or emergency room at once. NOTE: This medicine is only for you. Do not share this medicine with others. What if I miss a dose? It is important not to miss your dose. Call your care team if you are unable to keep an appointment. What may interact with this medication? Other iron products This list may not describe all possible interactions. Give your health care provider a list of all the medicines, herbs, non-prescription drugs, or dietary supplements you use. Also tell them if you smoke, drink alcohol, or use illegal drugs. Some items may interact with your medicine. What should I watch for while using this medication? Visit your care team for regular checks on your progress. Tell your care team if your symptoms do not start to get better or if they get worse. You may need blood work done while you are taking this medication. You may need to eat more foods that contain iron. Talk to your care team. Foods that contain iron include whole grains or cereals, dried fruits, beans, peas, leafy green vegetables, and organ meats (liver, kidney). What side effects may I notice from receiving this medication? Side effects that  you should report to your care team as soon as possible: Allergic reactions--skin rash, itching, hives, swelling of the face, lips, tongue, or throat Low blood pressure--dizziness, feeling faint or lightheaded, blurry vision Shortness of breath Side effects that usually do not require medical attention (report to your care team if they continue or are bothersome): Flushing Headache Joint pain Muscle pain Nausea Pain, redness, or  irritation at injection site This list may not describe all possible side effects. Call your doctor for medical advice about side effects. You may report side effects to FDA at 1-800-FDA-1088. Where should I keep my medication? This medication is given in a hospital or clinic. It will not be stored at home. NOTE: This sheet is a summary. It may not cover all possible information. If you have questions about this medicine, talk to your doctor, pharmacist, or health care provider.  2024 Elsevier/Gold Standard (2022-09-02 00:00:00)      To help prevent nausea and vomiting after your treatment, we encourage you to take your nausea medication as directed.  BELOW ARE SYMPTOMS THAT SHOULD BE REPORTED IMMEDIATELY: *FEVER GREATER THAN 100.4 F (38 C) OR HIGHER *CHILLS OR SWEATING *NAUSEA AND VOMITING THAT IS NOT CONTROLLED WITH YOUR NAUSEA MEDICATION *UNUSUAL SHORTNESS OF BREATH *UNUSUAL BRUISING OR BLEEDING *URINARY PROBLEMS (pain or burning when urinating, or frequent urination) *BOWEL PROBLEMS (unusual diarrhea, constipation, pain near the anus) TENDERNESS IN MOUTH AND THROAT WITH OR WITHOUT PRESENCE OF ULCERS (sore throat, sores in mouth, or a toothache) UNUSUAL RASH, SWELLING OR PAIN  UNUSUAL VAGINAL DISCHARGE OR ITCHING   Items with * indicate a potential emergency and should be followed up as soon as possible or go to the Emergency Department if any problems should occur.  Please show the CHEMOTHERAPY ALERT CARD or IMMUNOTHERAPY ALERT CARD at check-in to the Emergency Department and triage nurse.  Should you have questions after your visit or need to cancel or reschedule your appointment, please contact [facility] [place] - A DEPT OF Eligha Brideg[facility] [phone]  and follow the prompts.  Office hours are 8:00 a.m. to 4:30 p.m. Monday - Friday. Please note that voicemails left after 4:00 p.m. may not be returned until the following business day.  We are closed weekends  and major holidays. You have access to a nurse at all times for urgent questions. Please call the main number to the clinic [phone] and follow the prompts.  For any non-urgent questions, you may also contact your provider using MyChart. We now offer e-Visits for anyone 67 and older to request care online for non-urgent symptoms. For details visit [email or web address].   Also download the MyChart app! Go to the app store, search "MyChart", open the app, select [place], and log in with your MyChart username and password.

## 2024-01-09 ENCOUNTER — Other Ambulatory Visit: Payer: Self-pay | Admitting: Family Medicine

## 2024-01-14 ENCOUNTER — Inpatient Hospital Stay

## 2024-01-14 VITALS — BP 128/81 | HR 67 | Temp 97.9°F | Resp 18

## 2024-01-14 DIAGNOSIS — D500 Iron deficiency anemia secondary to blood loss (chronic): Secondary | ICD-10-CM | POA: Diagnosis not present

## 2024-01-14 DIAGNOSIS — D508 Other iron deficiency anemias: Secondary | ICD-10-CM

## 2024-01-14 MED ORDER — SODIUM CHLORIDE 0.9 % IV SOLN
510.0000 mg | Freq: Once | INTRAVENOUS | Status: AC
  Administered 2024-01-14: 510 mg via INTRAVENOUS
  Filled 2024-01-14: qty 510

## 2024-01-14 MED ORDER — ACETAMINOPHEN 325 MG PO TABS
650.0000 mg | ORAL_TABLET | Freq: Once | ORAL | Status: AC
  Administered 2024-01-14: 650 mg via ORAL
  Filled 2024-01-14: qty 2

## 2024-01-14 MED ORDER — SODIUM CHLORIDE 0.9 % IV SOLN: INTRAVENOUS | Status: DC

## 2024-01-14 MED ORDER — CETIRIZINE HCL 10 MG PO TABS
10.0000 mg | ORAL_TABLET | Freq: Once | ORAL | Status: AC
  Administered 2024-01-14: 10 mg via ORAL
  Filled 2024-01-14: qty 1

## 2024-01-14 MED ORDER — FAMOTIDINE IN NACL 20-0.9 MG/50ML-% IV SOLN
20.0000 mg | Freq: Once | INTRAVENOUS | Status: AC
  Administered 2024-01-14: 20 mg via INTRAVENOUS
  Filled 2024-01-14: qty 50

## 2024-01-14 NOTE — Progress Notes (Signed)
 Prior to start of iron today and pre-meds. Patient still has some mild swelling in her ankles. Kristin Curcio NP notified and evaluated prior to infusion.   Ok to proceed per provider.     Feraheme iron infusion given per orders. Patient tolerated it well without problems. Vitals stable and discharged home from clinic ambulatory. Follow up as scheduled

## 2024-01-14 NOTE — Patient Instructions (Signed)

## 2024-01-17 ENCOUNTER — Ambulatory Visit: Payer: Self-pay | Admitting: Internal Medicine

## 2024-01-24 ENCOUNTER — Ambulatory Visit: Payer: Self-pay

## 2024-01-24 ENCOUNTER — Encounter: Payer: Self-pay | Admitting: *Deleted

## 2024-01-24 ENCOUNTER — Telehealth: Payer: Self-pay | Admitting: *Deleted

## 2024-01-24 NOTE — Telephone Encounter (Signed)
 FYI Only or Action Required?: Action required by provider: request for appointment.  Patient was last seen in primary care on 09/29/2023 by Terry Wilhelmena Lloyd Hilario, FNP.  Called Nurse Triage reporting Cough.  Symptoms began a week ago.  Interventions attempted: Nothing.  Symptoms are: unchanged. Has had chest and head congestion since Christmas. Chest hurts from coughing. Declines to be seen in another office, asking to be worked in. Please advise pt.  Triage Disposition: See Physician Within 24 Hours  Patient/caregiver understands and will follow disposition?: Yes     Copied from CRM #[id number]. Topic: Clinical - Red Word Triage >> Jan 24, 2024  1:41 PM Charlet HERO wrote: Red Word that prompted transfer to Nurse Triage: Patient is calling with bad cold head congestiion, did 2 iron fusiions and she got sick, she is very hot and headaches pounding. Gets worst when she lays down. Autumn [name]. Reason for Disposition  [1] Continuous (nonstop) coughing interferes with work or school AND [2] no improvement using cough treatment per Care Advice  Answer Assessment - Initial Assessment Questions 1. ONSET: When did the cough begin?      Christmas 2. SEVERITY: How bad is the cough today?      severe 3. SPUTUM: Describe the color of your sputum (e.g., none, dry cough; clear, white, yellow, green)     no 4. HEMOPTYSIS: Are you coughing up any blood? If Yes, ask: How much? (e.g., flecks, streaks, tablespoons, etc.)     no 5. DIFFICULTY BREATHING: Are you having difficulty breathing? If Yes, ask: How bad is it? (e.g., mild, moderate, severe)      no 6. FEVER: Do you have a fever? If Yes, ask: What is your temperature, how was it measured, and when did it start?     Feels not 7. CARDIAC HISTORY: Do you have any history of heart disease? (e.g., heart attack, congestive heart failure)      no 8. LUNG HISTORY: Do you have any history of lung disease?  (e.g.,  pulmonary embolus, asthma, emphysema)     no 9. PE RISK FACTORS: Do you have a history of blood clots? (or: recent major surgery, recent prolonged travel, bedridden)     no 10. OTHER SYMPTOMS: Do you have any other symptoms? (e.g., runny nose, wheezing, chest pain)       wheezing 11. PREGNANCY: Is there any chance you are pregnant? When was your last menstrual period?       no 12. TRAVEL: Have you traveled out of the country in the last month? (e.g., travel history, exposures)       no  Protocols used: Cough - Acute Non-Productive-A-AH

## 2024-01-24 NOTE — Telephone Encounter (Signed)
 Patient called to advise that she has had cough, fever >102, headache and generalized malaise since before Christmas.  Advised that she should consult with PCP or go to Urgent Care.  Verbalized understanding.

## 2024-01-26 ENCOUNTER — Ambulatory Visit (INDEPENDENT_AMBULATORY_CARE_PROVIDER_SITE_OTHER): Admitting: Nurse Practitioner

## 2024-01-26 ENCOUNTER — Encounter: Payer: Self-pay | Admitting: Nurse Practitioner

## 2024-01-26 VITALS — BP 116/75 | HR 99 | Ht 65.0 in | Wt 249.0 lb

## 2024-01-26 DIAGNOSIS — J40 Bronchitis, not specified as acute or chronic: Secondary | ICD-10-CM | POA: Diagnosis not present

## 2024-01-26 MED ORDER — AMOXICILLIN-POT CLAVULANATE 875-125 MG PO TABS: 1.0000 | ORAL_TABLET | Freq: Two times a day (BID) | ORAL | 0 refills | Status: AC

## 2024-01-26 MED ORDER — HYDROCODONE BIT-HOMATROP MBR 5-1.5 MG/5ML PO SOLN: 5.0000 mL | Freq: Three times a day (TID) | ORAL | 0 refills | Status: DC | PRN

## 2024-01-26 NOTE — Addendum Note (Signed)
 Addended by: Alyric Parkin, MARY-MARGARET on: 01/26/2024 01:12 PM   Modules accepted: Orders

## 2024-01-26 NOTE — Progress Notes (Signed)
 "  Subjective:    Patient ID: Autumn [name], female    DOB: 1965/04/21, 58 y.o.   MRN: [id number]   Chief Complaint: URI (Cough, short of breath, chest tightness, fever )   URI  This is a new problem. The current episode started in the past 7 days. The problem has been gradually worsening. Maximum temperature: sweating but hasnot taken temperature. Associated symptoms include congestion, coughing, rhinorrhea and a sore throat. Pertinent negatives include no ear pain or wheezing. Treatments tried: nyquil. The treatment provided mild relief.    Patient Active Problem List   Diagnosis Date Noted   IDA (iron deficiency anemia) 01/03/2024   Thyroid  nodule 11/16/2023   Dysphagia 11/16/2023   Shoulder pain 05/26/2023   Anxiety and depression 05/26/2023   Insomnia 05/26/2023   Type 2 diabetes mellitus (HCC) 01/25/2023   Upper respiratory infection 10/20/2022   Persistent cough for 3 weeks or longer 10/16/2022   Left knee pain 07/20/2022   Hidradenitis suppurativa of anus 07/20/2022   Hypertension 06/12/2022   Abscess, gluteal, left 01/07/2021   Sore throat 11/28/2020   Pill dysphagia 09/03/2020   Bloating 09/03/2020   Early satiety 09/03/2020   Internal hemorrhoids 04/11/2020   Gastroesophageal reflux disease without esophagitis 10/25/2019   Infected sebaceous cyst of skin 07/27/2019   Chest pain 04/19/2019   Trichomonal vaginitis 03/20/2019   Bacterial vaginosis 03/20/2019   Morbid obesity with BMI of 40.0-44.9, adult (HCC) 03/15/2019   Rectal pain 01/13/2019   Constipation 03/01/2018   Rectal bleeding 03/01/2018   Abdominal pain 03/01/2018   Sebaceous cyst 11/30/2017   Fibroids, submucosal 12/02/2016   Thickened endometrium 12/02/2016   Stiffness of joint, not elsewhere classified, ankle and foot 06/27/2012   Pain in joint, ankle and foot 06/27/2012   Difficulty walking 06/27/2012   Ankle sprain 06/15/2012   Sprain of ankle 09/16/2009   ANEMIA 03/07/2008    CIGARETTE SMOKER 03/07/2008   ABDOMINAL BLOATING 03/07/2008   CONSTIPATION, CHRONIC, HX OF 03/07/2008       Review of Systems  Constitutional:  Positive for fever. Negative for chills.  HENT:  Positive for congestion, rhinorrhea and sore throat. Negative for ear pain.   Respiratory:  Positive for cough. Negative for shortness of breath and wheezing.        Objective:   Physical Exam Constitutional:      Appearance: Normal appearance.  HENT:     Right Ear: Tympanic membrane normal.     Left Ear: Tympanic membrane normal.     Nose: Congestion and rhinorrhea present.     Right Sinus: Maxillary sinus tenderness present.     Left Sinus: Maxillary sinus tenderness present.     Mouth/Throat:     Pharynx: No oropharyngeal exudate or posterior oropharyngeal erythema.  Cardiovascular:     Rate and Rhythm: Normal rate and regular rhythm.     Heart sounds: Normal heart sounds.  Pulmonary:     Breath sounds: Normal breath sounds.  Skin:    General: Skin is warm.  Neurological:     General: No focal deficit present.     Mental Status: She is alert and oriented to person, place, and time.  Psychiatric:        Mood and Affect: Mood normal.        Behavior: Behavior normal.     BP 116/75   Pulse 99   Ht 5' 5 (1.651 m)   Wt 249 lb (112.9 kg)   LMP 11/11/2016   SpO2  99%   BMI 41.44 kg/m        Assessment & Plan:   Autumn [name] in today with chief complaint of URI (Cough, short of breath, chest tightness, fever )   1. bronchitis 1. Take meds as prescribed 2. Use a cool mist humidifier especially during the winter months and when heat has been humid. 3. Use saline nose sprays frequently 4. Saline irrigations of the nose can be very helpful if done frequently.  * 4X daily for 1 week*  * Use of a nettie pot can be helpful with this. Follow directions with this* 5. Drink plenty of fluids 6. Keep thermostat turn down low 7.For any cough or congestion- hycodan  with sedation precautions 8. For fever or aces or pains- take tylenol  or ibuprofen  appropriate for age and weight.  * for fevers greater than 101 orally you may alternate ibuprofen  and tylenol  every  3 hours.    - amoxicillin -clavulanate (AUGMENTIN ) 875-125 MG tablet; Take 1 tablet by mouth 2 (two) times daily.  Dispense: 14 tablet; Refill: 0 - HYDROcodone  bit-homatropine (HYCODAN) 5-1.5 MG/5ML syrup; Take 5 mLs by mouth every 8 (eight) hours as needed for cough.  Dispense: 120 mL; Refill: 0    The above assessment and management plan was discussed with the patient. The patient verbalized understanding of and has agreed to the management plan. Patient is aware to call the clinic if symptoms persist or worsen. Patient is aware when to return to the clinic for a follow-up visit. Patient educated on when it is appropriate to go to the emergency department.   Mary-Margaret Gladis, FNP   "

## 2024-01-26 NOTE — Patient Instructions (Signed)
1. Take meds as prescribed 2. Use a cool mist humidifier especially during the winter months and when heat has been humid. 3. Use saline nose sprays frequently 4. Saline irrigations of the nose can be very helpful if done frequently.  * 4X daily for 1 week*  * Use of a nettie pot can be helpful with this. Follow directions with this* 5. Drink plenty of fluids 6. Keep thermostat turn down low 7.For any cough or congestion- hycodan with sedation precautions 8. For fever or aces or pains- take tylenol or ibuprofen appropriate for age and weight.  * for fevers greater than 101 orally you may alternate ibuprofen and tylenol every  3 hours.    

## 2024-02-01 ENCOUNTER — Ambulatory Visit: Payer: Self-pay | Admitting: Nurse Practitioner

## 2024-02-01 LAB — VERITOR FLU A/B WAIVED
Influenza A: NEGATIVE
Influenza B: NEGATIVE

## 2024-02-02 ENCOUNTER — Telehealth: Payer: Self-pay

## 2024-02-02 NOTE — Telephone Encounter (Signed)
 Copied from CRM [id number]. Topic: Clinical - Prescription Issue >> Feb 02, 2024  2:17 PM Selinda RAMAN wrote: Reason for CRM: The patient called in stating she saw a provider 8 days ago and was prescribed HYDROcodone  bit-homatropine (HYCODAN) 5-1.5 MG/5ML syrup which she has now finished. She states it did not help at all and in fact she actually feels worse. She states her only symptom is a cough but it is nagging and draining. She also did not want to be triaged at this time. She is hoping there is anything at all that could possibly be more effective for her cough and if so if it can be called into her pharmacy. She uses  [facility] - [place], [place] - [address]  Phone: [phone] Fax: [phone]   Please assist patient further

## 2024-02-03 ENCOUNTER — Ambulatory Visit: Payer: Self-pay | Admitting: Family Medicine

## 2024-02-03 ENCOUNTER — Other Ambulatory Visit: Payer: Self-pay | Admitting: Family Medicine

## 2024-02-03 DIAGNOSIS — R058 Other specified cough: Secondary | ICD-10-CM

## 2024-02-03 MED ORDER — PROMETHAZINE-DM 6.25-15 MG/5ML PO SYRP: 5.0000 mL | ORAL_SOLUTION | Freq: Four times a day (QID) | ORAL | 0 refills | Status: AC | PRN

## 2024-02-03 NOTE — Telephone Encounter (Signed)
 FYI Only or Action Required?: Action required by provider: medication request.  Patient was last seen in primary care on 01/26/2024 by Autumn Mustard, FNP.  Called Nurse Triage reporting Cough.  Symptoms began several weeks ago.  Interventions attempted: Prescription medications: HYDROcodone  bit-homatropine (HYCODAN) 5-1.5 MG/5ML syrup.  Symptoms are: gradually worsening.  Triage Disposition: See HCP Within 4 Hours (Or PCP Triage)  Patient/caregiver understands and will follow disposition?: Yes    Message from Zebedee SAUNDERS sent at 02/03/2024  3:50 PM EST  Summary: Coughing not going away   Reason for Triage: The patient called in stating she saw a provider 8 days ago and was prescribed HYDROcodone  bit-homatropine (HYCODAN) 5-1.5 MG/5ML syrup which she has now finished. She states it did not help at all and in fact she actually feels worse. She states her only symptom is a cough but it is nagging and draining. She also did not want to be triaged at this time. She is hoping there is anything at all that could possibly be more effective for her cough and if so if it can be called into her pharmacy. She uses  [facility] - [place], [place] - [address] Phone: [phone] Fax: [phone]  Please call pt at [phone].      Reason for Disposition  Wheezing is present  Answer Assessment - Initial Assessment Questions 1. ONSET: When did the cough begin?      X Christmas   2. SEVERITY: How bad is the cough today?       Worse since onset  3. SPUTUM: Describe the color of your sputum (e.g., none, dry cough; clear, white, yellow, green)     Dry  4. HEMOPTYSIS: Are you coughing up any blood? If Yes, ask: How much? (e.g., flecks, streaks, tablespoons, etc.)     Denies  5. DIFFICULTY BREATHING: Are you having difficulty breathing? If Yes, ask: How bad is it? (e.g., mild, moderate, severe)      Denies  6. FEVER: Do you have a fever? If Yes,  ask: What is your temperature, how was it measured, and when did it start?     Denies  7. CARDIAC HISTORY: Do you have any history of heart disease? (e.g., heart attack, congestive heart failure)      Per pt's chart, HTN  8. LUNG HISTORY: Do you have any history of lung disease?  (e.g., pulmonary embolus, asthma, emphysema)     Per pt's chart, URI  9. PE RISK FACTORS: Do you have a history of blood clots? (or: recent major surgery, recent prolonged travel, bedridden)     Per pt's chart, pt does not have PE risk factors  10. OTHER SYMPTOMS: Pt denies runny nose, wheezing, chest pain   Pt reports cough Pt is taking OTC cough syrup Pt advised to try warm tea and honey in the meantime Earliest appt is tomorrow at a different clinic for pt, however, pt already seen for symptoms and is requesting another medication to assist with the cough. Pt agrees with plan of care, will call back for any worsening symptoms  Protocols used: Cough - Acute Non-Productive-A-AH

## 2024-02-03 NOTE — Telephone Encounter (Signed)
 Rx sent

## 2024-02-03 NOTE — Telephone Encounter (Signed)
 Call #1- Attempted to call pt back at [phone] , no answer, LVM and instructed pt to call back at [phone]       Reason for Triage: The patient called in stating she saw a provider 8 days ago and was prescribed HYDROcodone  bit-homatropine (HYCODAN) 5-1.5 MG/5ML syrup which she has now finished. She states it did not help at all and in fact she actually feels worse. She states her only symptom is a cough but it is nagging and draining. She also did not want to be triaged at this time. She is hoping there is anything at all that could possibly be more effective for her cough and if so if it can be called into her pharmacy. She uses  [facility] - [place], [place] - [address] Phone: [phone] Fax: [phone]  Please call pt at [phone].

## 2024-02-04 ENCOUNTER — Other Ambulatory Visit: Payer: Self-pay

## 2024-02-04 DIAGNOSIS — J40 Bronchitis, not specified as acute or chronic: Secondary | ICD-10-CM

## 2024-02-04 MED ORDER — HYDROCODONE BIT-HOMATROP MBR 5-1.5 MG/5ML PO SOLN: 5.0000 mL | Freq: Three times a day (TID) | ORAL | 0 refills | Status: AC | PRN

## 2024-02-04 NOTE — Telephone Encounter (Signed)
 Cough syrup refilled

## 2024-02-04 NOTE — Telephone Encounter (Signed)
 Pt informed

## 2024-02-16 ENCOUNTER — Ambulatory Visit: Admitting: Gastroenterology

## 2024-02-18 ENCOUNTER — Inpatient Hospital Stay

## 2024-02-18 ENCOUNTER — Inpatient Hospital Stay: Admitting: Hematology

## 2024-02-29 ENCOUNTER — Other Ambulatory Visit: Payer: Self-pay

## 2024-02-29 ENCOUNTER — Inpatient Hospital Stay

## 2024-02-29 DIAGNOSIS — D508 Other iron deficiency anemias: Secondary | ICD-10-CM

## 2024-03-01 ENCOUNTER — Inpatient Hospital Stay: Attending: Physician Assistant

## 2024-03-01 DIAGNOSIS — D508 Other iron deficiency anemias: Secondary | ICD-10-CM

## 2024-03-01 LAB — COMPREHENSIVE METABOLIC PANEL WITH GFR
ALT: 21 U/L (ref 0–44)
AST: 22 U/L (ref 15–41)
Albumin: 4 g/dL (ref 3.5–5.0)
Alkaline Phosphatase: 88 U/L (ref 38–126)
Anion gap: 12 (ref 5–15)
BUN: 8 mg/dL (ref 6–20)
CO2: 26 mmol/L (ref 22–32)
Calcium: 10 mg/dL (ref 8.9–10.3)
Chloride: 107 mmol/L (ref 98–111)
Creatinine, Ser: 0.78 mg/dL (ref 0.44–1.00)
GFR, Estimated: >60 mL/min
Glucose, Bld: 98 mg/dL (ref 70–99)
Potassium: 4.3 mmol/L (ref 3.5–5.1)
Sodium: 145 mmol/L (ref 135–145)
Total Bilirubin: 0.3 mg/dL (ref 0.0–1.2)
Total Protein: 7.1 g/dL (ref 6.5–8.1)

## 2024-03-01 LAB — CBC WITH DIFFERENTIAL/PLATELET
Abs Immature Granulocytes: 0.02 10*3/uL (ref 0.00–0.07)
Basophils Absolute: 0.1 10*3/uL (ref 0.0–0.1)
Basophils Relative: 1 %
Eosinophils Absolute: 0.3 10*3/uL (ref 0.0–0.5)
Eosinophils Relative: 3 %
HCT: 37 % (ref 36.0–46.0)
Hemoglobin: 11 g/dL — ABNORMAL LOW (ref 12.0–15.0)
Immature Granulocytes: 0 %
Lymphocytes Relative: 47 %
Lymphs Abs: 4.1 10*3/uL — ABNORMAL HIGH (ref 0.7–4.0)
MCH: 21.9 pg — ABNORMAL LOW (ref 26.0–34.0)
MCHC: 29.7 g/dL — ABNORMAL LOW (ref 30.0–36.0)
MCV: 73.7 fL — ABNORMAL LOW (ref 80.0–100.0)
Monocytes Absolute: 1 10*3/uL (ref 0.1–1.0)
Monocytes Relative: 11 %
Neutro Abs: 3.4 10*3/uL (ref 1.7–7.7)
Neutrophils Relative %: 38 %
Platelets: 407 10*3/uL — ABNORMAL HIGH (ref 150–400)
RBC: 5.02 MIL/uL (ref 3.87–5.11)
RDW: 19.7 % — ABNORMAL HIGH (ref 11.5–15.5)
WBC: 8.7 10*3/uL (ref 4.0–10.5)
nRBC: 0 % (ref 0.0–0.2)

## 2024-03-01 LAB — RETIC PANEL
Immature Retic Fract: 9.5 % (ref 2.3–15.9)
RBC.: 5 MIL/uL (ref 3.87–5.11)
Retic Count, Absolute: 51.5 10*3/uL (ref 19.0–186.0)
Retic Ct Pct: 1 % (ref 0.4–3.1)
Reticulocyte Hemoglobin: 25.8 pg — ABNORMAL LOW

## 2024-03-01 LAB — IRON AND TIBC
Iron: 54 ug/dL (ref 28–170)
Saturation Ratios: 16 % (ref 10.4–31.8)
TIBC: 332 ug/dL (ref 250–450)
UIBC: 278 ug/dL

## 2024-03-01 LAB — FERRITIN: Ferritin: 189 ng/mL (ref 11–307)

## 2024-03-07 ENCOUNTER — Inpatient Hospital Stay: Admitting: Oncology

## 2024-04-13 ENCOUNTER — Ambulatory Visit: Admitting: Gastroenterology

## 2024-08-07 ENCOUNTER — Ambulatory Visit

## 2024-08-08 ENCOUNTER — Ambulatory Visit
